# Patient Record
Sex: Male | Born: 1965 | Race: White | Hispanic: No | Marital: Married | Smoking: Never smoker
Health system: Southern US, Community
[De-identification: ages and names within clinical notes are randomized; demographics above are authoritative.]

## PROBLEM LIST (undated history)

## (undated) DIAGNOSIS — R413 Other amnesia: Secondary | ICD-10-CM

## (undated) DIAGNOSIS — K839 Disease of biliary tract, unspecified: Secondary | ICD-10-CM

## (undated) DIAGNOSIS — K9041 Non-celiac gluten sensitivity: Secondary | ICD-10-CM

## (undated) DIAGNOSIS — K859 Acute pancreatitis without necrosis or infection, unspecified: Secondary | ICD-10-CM

## (undated) DIAGNOSIS — F419 Anxiety disorder, unspecified: Secondary | ICD-10-CM

## (undated) DIAGNOSIS — R51 Headache: Secondary | ICD-10-CM

## (undated) DIAGNOSIS — M1990 Unspecified osteoarthritis, unspecified site: Secondary | ICD-10-CM

## (undated) DIAGNOSIS — K449 Diaphragmatic hernia without obstruction or gangrene: Secondary | ICD-10-CM

## (undated) DIAGNOSIS — I499 Cardiac arrhythmia, unspecified: Secondary | ICD-10-CM

## (undated) DIAGNOSIS — K900 Celiac disease: Secondary | ICD-10-CM

## (undated) DIAGNOSIS — I10 Essential (primary) hypertension: Secondary | ICD-10-CM

## (undated) DIAGNOSIS — K219 Gastro-esophageal reflux disease without esophagitis: Secondary | ICD-10-CM

## (undated) DIAGNOSIS — J45909 Unspecified asthma, uncomplicated: Secondary | ICD-10-CM

## (undated) DIAGNOSIS — E785 Hyperlipidemia, unspecified: Secondary | ICD-10-CM

## (undated) DIAGNOSIS — R519 Headache, unspecified: Secondary | ICD-10-CM

## (undated) DIAGNOSIS — H9190 Unspecified hearing loss, unspecified ear: Secondary | ICD-10-CM

## (undated) DIAGNOSIS — H539 Unspecified visual disturbance: Secondary | ICD-10-CM

## (undated) DIAGNOSIS — J189 Pneumonia, unspecified organism: Secondary | ICD-10-CM

## (undated) HISTORY — DX: Diaphragmatic hernia without obstruction or gangrene: K44.9

## (undated) HISTORY — DX: Disease of biliary tract, unspecified: K83.9

## (undated) HISTORY — DX: Other amnesia: R41.3

## (undated) HISTORY — DX: Hyperlipidemia, unspecified: E78.5

## (undated) HISTORY — PX: COLONOSCOPY: SHX174

## (undated) HISTORY — DX: Headache, unspecified: R51.9

## (undated) HISTORY — DX: Unspecified hearing loss, unspecified ear: H91.90

## (undated) HISTORY — DX: Celiac disease: K90.0

## (undated) HISTORY — DX: Non-celiac gluten sensitivity: K90.41

## (undated) HISTORY — DX: Unspecified visual disturbance: H53.9

## (undated) HISTORY — DX: Acute pancreatitis without necrosis or infection, unspecified: K85.90

## (undated) HISTORY — DX: Gastro-esophageal reflux disease without esophagitis: K21.9

## (undated) HISTORY — PX: WISDOM TOOTH EXTRACTION: SHX21

## (undated) HISTORY — DX: Headache: R51

## (undated) HISTORY — DX: Cardiac arrhythmia, unspecified: I49.9

## (undated) HISTORY — PX: UPPER GASTROINTESTINAL ENDOSCOPY: SHX188

## (undated) HISTORY — DX: Essential (primary) hypertension: I10

---

## 2001-05-16 ENCOUNTER — Encounter: Admission: RE | Admit: 2001-05-16 | Discharge: 2001-05-16 | Payer: Self-pay | Admitting: Unknown Physician Specialty

## 2002-03-10 ENCOUNTER — Encounter: Admission: RE | Admit: 2002-03-10 | Discharge: 2002-03-10 | Payer: Self-pay

## 2003-11-13 ENCOUNTER — Encounter: Admission: RE | Admit: 2003-11-13 | Discharge: 2003-11-13 | Payer: Self-pay

## 2007-12-19 ENCOUNTER — Encounter: Admission: RE | Admit: 2007-12-19 | Discharge: 2007-12-19 | Payer: Self-pay | Admitting: Internal Medicine

## 2008-07-15 ENCOUNTER — Encounter: Admission: RE | Admit: 2008-07-15 | Discharge: 2008-07-15 | Payer: Self-pay | Admitting: Internal Medicine

## 2008-11-17 ENCOUNTER — Encounter: Admission: RE | Admit: 2008-11-17 | Discharge: 2008-11-17 | Payer: Self-pay | Admitting: Internal Medicine

## 2010-04-08 ENCOUNTER — Encounter: Admission: RE | Admit: 2010-04-08 | Discharge: 2010-04-08 | Payer: Self-pay | Admitting: Internal Medicine

## 2014-02-03 ENCOUNTER — Ambulatory Visit: Payer: Self-pay | Admitting: Internal Medicine

## 2014-02-05 ENCOUNTER — Ambulatory Visit: Payer: Self-pay | Admitting: Internal Medicine

## 2014-02-05 ENCOUNTER — Encounter: Payer: Self-pay | Admitting: Internal Medicine

## 2014-02-05 ENCOUNTER — Ambulatory Visit (INDEPENDENT_AMBULATORY_CARE_PROVIDER_SITE_OTHER): Payer: BC Managed Care – PPO | Admitting: Internal Medicine

## 2014-02-05 VITALS — BP 114/88 | HR 92 | Ht 66.0 in | Wt 196.8 lb

## 2014-02-05 DIAGNOSIS — R9431 Abnormal electrocardiogram [ECG] [EKG]: Secondary | ICD-10-CM

## 2014-02-05 DIAGNOSIS — R079 Chest pain, unspecified: Secondary | ICD-10-CM

## 2014-02-05 DIAGNOSIS — E785 Hyperlipidemia, unspecified: Secondary | ICD-10-CM

## 2014-02-05 DIAGNOSIS — I10 Essential (primary) hypertension: Secondary | ICD-10-CM

## 2014-02-05 DIAGNOSIS — R072 Precordial pain: Secondary | ICD-10-CM

## 2014-02-05 NOTE — Patient Instructions (Signed)
Your physician has requested that you have en exercise stress myoview. For further information please visit [email or web address]. Please follow instruction sheet, as given.  Your physician recommends that you schedule a follow-up appointment after your stress test.

## 2014-02-09 ENCOUNTER — Encounter: Payer: Self-pay | Admitting: Internal Medicine

## 2014-02-09 DIAGNOSIS — I10 Essential (primary) hypertension: Secondary | ICD-10-CM | POA: Insufficient documentation

## 2014-02-09 DIAGNOSIS — R079 Chest pain, unspecified: Secondary | ICD-10-CM | POA: Insufficient documentation

## 2014-02-10 ENCOUNTER — Encounter: Payer: Self-pay | Admitting: Internal Medicine

## 2014-02-10 NOTE — Progress Notes (Signed)
OFFICE NOTE  Chief Complaint:  Chest pain  Primary Care Physician: Andreas Blower, MD  HPI:  Gary Weaver is a pleasant 48 year old male who is referred to me for evaluation of chest pain. He reports that over the past year he's had some shortness of breath with this is fairly stable. Recently he's had some discomfort in his chest which is radiated to his left arm. He does have a history of hypertension. There is a history of hypertension in his family. He's never had a cardiac stress test before. His primary care provider's office his EKG showed nonspecific ST and T wave changes concerning for ischemia. He also has a history of dyslipidemia his symptoms as mentioned are worse with exertion somewhat relieved by rest. He also has a history of GERD and is on medication for that. He is difficult to discern whether his symptoms may be related to reflux or perhaps are of cardiac etiology.  PMHx:  Past Medical History  Diagnosis Date  . GERD (gastroesophageal reflux disease)   . Gluten intolerance   . Hyperlipidemia   . Hypertension     History reviewed. No pertinent past surgical history.  FAMHx:  Family History  Problem Relation Age of Onset  . Hypertension Father     SOCHx:   reports that he has never smoked. He does not have any smokeless tobacco history on file. His alcohol and drug histories are not on file.  ALLERGIES:  Allergies  Allergen Reactions  . Gluten Meal Nausea And Vomiting    ROS: A comprehensive review of systems was negative except for: Respiratory: positive for dyspnea on exertion Cardiovascular: positive for exertional chest pressure/discomfort Gastrointestinal: positive for reflux symptoms  HOME MEDS: Current Outpatient Prescriptions  Medication Sig Dispense Refill  . fexofenadine (ALLEGRA) 30 MG tablet Take 30 mg by mouth daily.      . fluticasone (FLONASE) 50 MCG/ACT nasal spray Place 1 spray into both nostrils as needed.      Marland Kitchen lisinopril  (PRINIVIL,ZESTRIL) 20 MG tablet Take 20 mg by mouth daily.      Marland Kitchen omeprazole (PRILOSEC) 40 MG capsule Take 40 mg by mouth daily.       No current facility-administered medications for this visit.    LABS/IMAGING: No results found for this or any previous visit (from the past 48 hour(s)). No results found.  VITALS: BP 114/88  Pulse 92  Ht 5' 6"  (1.676 m)  Wt 196 lb 12.8 oz (89.268 kg)  BMI 31.78 kg/m2  EXAM: General appearance: alert and no distress Neck: no carotid bruit and no JVD Lungs: clear to auscultation bilaterally Heart: regular rate and rhythm, S1, S2 normal, no murmur, click, rub or gallop Abdomen: soft, non-tender; bowel sounds normal; no masses,  no organomegaly Extremities: extremities normal, atraumatic, no cyanosis or edema Pulses: 2+ and symmetric Skin: Skin color, texture, turgor normal. No rashes or lesions Neurologic: Grossly normal Psych: Mood, affect normal  EKG: Sinus rhythm with inverted T waves in 2, 3 and aVF concerning for possible ischemia  ASSESSMENT: 1. Chest/arm pain 2. Dyspnea on exertion 3. Abnormal EKG 4. Hypertension 5. Dyslipidemia 6. GERD  PLAN: 1.   Mr. Amick has a number of risk factors and a mildly abnormal EKG. He is describing some chest discomfort with left arm pain. I would recommend an exercise nuclear stress test to further evaluate. He does have a history of reflux as well which could be playing a role in his chest discomfort. Although his EKG  changes should be further evaluated. Plan to see him back after his stress test to review the results with him.  Pixie Casino, MD, [facility] Attending Cardiologist [facility]  Tameem Pullara C 02/10/2014, 5:28 PM

## 2014-02-12 ENCOUNTER — Telehealth (HOSPITAL_COMMUNITY): Payer: Self-pay

## 2014-02-12 NOTE — Telephone Encounter (Signed)
Pt given detailed instructions for NUC MPI on Tuesday. Pt did RBV same.  

## 2014-02-17 ENCOUNTER — Ambulatory Visit (HOSPITAL_COMMUNITY)
Admission: RE | Admit: 2014-02-17 | Discharge: 2014-02-17 | Disposition: A | Discharge status: Home / Self Care | Payer: BC Managed Care – PPO | Source: Ambulatory Visit | Attending: Internal Medicine | Admitting: Internal Medicine

## 2014-02-17 DIAGNOSIS — R0989 Other specified symptoms and signs involving the circulatory and respiratory systems: Secondary | ICD-10-CM | POA: Insufficient documentation

## 2014-02-17 DIAGNOSIS — R079 Chest pain, unspecified: Secondary | ICD-10-CM

## 2014-02-17 DIAGNOSIS — E785 Hyperlipidemia, unspecified: Secondary | ICD-10-CM

## 2014-02-17 DIAGNOSIS — J45909 Unspecified asthma, uncomplicated: Secondary | ICD-10-CM | POA: Insufficient documentation

## 2014-02-17 DIAGNOSIS — R0602 Shortness of breath: Secondary | ICD-10-CM | POA: Insufficient documentation

## 2014-02-17 DIAGNOSIS — R0609 Other forms of dyspnea: Secondary | ICD-10-CM | POA: Insufficient documentation

## 2014-02-17 DIAGNOSIS — R9431 Abnormal electrocardiogram [ECG] [EKG]: Secondary | ICD-10-CM | POA: Insufficient documentation

## 2014-02-17 DIAGNOSIS — I10 Essential (primary) hypertension: Secondary | ICD-10-CM

## 2014-02-17 DIAGNOSIS — R002 Palpitations: Secondary | ICD-10-CM | POA: Insufficient documentation

## 2014-02-17 DIAGNOSIS — R42 Dizziness and giddiness: Secondary | ICD-10-CM | POA: Insufficient documentation

## 2014-02-17 MED ORDER — TECHNETIUM TC 99M SESTAMIBI GENERIC - CARDIOLITE
10.4000 | Freq: Once | INTRAVENOUS | Status: AC | PRN
  Administered 2014-02-17: 10 via INTRAVENOUS

## 2014-02-17 MED ORDER — TECHNETIUM TC 99M SESTAMIBI GENERIC - CARDIOLITE
31.4000 | Freq: Once | INTRAVENOUS | Status: AC | PRN
  Administered 2014-02-17: 31 via INTRAVENOUS

## 2014-02-17 NOTE — Procedures (Addendum)
[facility] [address] [place] [address] [phone]  Cardiology Nuclear Med Study  Gary Weaver is a 48 y.o. male     MRN : [id number]     DOB: 02/05/1966  Procedure Date: 02/17/2014  Nuclear Med Background Indication for Stress Test:  Evaluation for Ischemia, [facility] and Abnormal EKG History:  Asthma and No prior cardiac history reported;No prior NUC MPI for comparison. Cardiac Risk Factors: Hypertension, Lipids and Overweight  Symptoms:  Chest Pain, DOE, Fatigue, Light-Headedness, Palpitations and SOB   Nuclear Pre-Procedure Caffeine/Decaff Intake:  1:00am NPO After: 11am   IV Site: R Forearm  IV 0.9% NS with Angio Cath:  22g  Chest Size (in):  46"  IV Started by: Rolene Course, RN  Height: 5' 6"  (1.676 m)  Cup Size: n/a  BMI:  Body mass index is 31.65 kg/(m^2). Weight:  196 lb (88.905 kg)   Tech Comments:  n/a    Nuclear Med Study 1 or 2 day study: 1 day  Stress Test Type:  Stress  Order Authorizing Provider:  Lyman Bishop, MD   Resting Radionuclide: Technetium 71mSestamibi  Resting Radionuclide Dose: 10.4 mCi   Stress Radionuclide:  Technetium 929mestamibi  Stress Radionuclide Dose: 31.4 mCi           Stress Protocol Rest HR: 80 Stress HR:169  Rest BP: 118/89 Stress BP:152/78  Exercise Time (min): 10:16 METS: 12.10   Predicted Max HR: 173 bpm % Max HR: 97.69 bpm Rate Pressure Product: 2521115Dose of Adenosine (mg):  n/a Dose of Lexiscan: n/a mg  Dose of Atropine (mg): n/a Dose of Dobutamine: n/a mcg/kg/min (at max HR)  Stress Test Technologist: GwMellody MemosCCT Nuclear Technologist: PaImagene RichesCNMT   Rest Procedure:  Myocardial perfusion imaging was performed at rest 45 minutes following the intravenous administration of Technetium 9925mstamibi. Stress Procedure:  The patient performed treadmill exercise using a Bruce  Protocol for 10 minutes 16 seconds. The patient stopped due to  fatigue and shortness of breath. Patinet denied any chest pain.  There were no significant ST-T wave changes.  Technetium 69m36mtamibi was injected IV at peak exercise and myocardial perfusion imaging was performed after a brief delay.  Transient Ischemic Dilatation (Normal <1.22):  0.90  QGS EDV:  82 ml QGS ESV:  36 ml LV Ejection Fraction: 56%  Rest ECG: NSR - Normal EKG  Stress ECG: No significant change from baseline ECG  QPS Raw Data Images:  There is interference from nuclear activity from structures below the diaphragm. This does not affect the ability to read the study. Stress Images:  Fixed apical lateral defect Rest Images:  Fixed apical lateral defect Subtraction (SDS):  No evidence of ischemia.  Impression Exercise Capacity:  Excellent exercise capacity. BP Response:  Normal blood pressure response. Clinical Symptoms:  No significant symptoms noted. ECG Impression:  No significant ST segment change suggestive of ischemia. Comparison with Prior Nuclear Study: No previous nuclear study performed  Overall Impression:  Low risk stress nuclear study with mild apical lateral artifact. No reversible ischemia.  LV Wall Motion:  NL LV Function; NL Wall Motion; EF 56%  KennPixie Casino, FACCEarlingtontified in Nuclear Cardiology Attending Cardiologist [facility]  02/17/2014 4:32 PM

## 2014-03-04 ENCOUNTER — Ambulatory Visit (INDEPENDENT_AMBULATORY_CARE_PROVIDER_SITE_OTHER): Payer: BC Managed Care – PPO | Admitting: Internal Medicine

## 2014-03-04 ENCOUNTER — Encounter (HOSPITAL_COMMUNITY): Payer: Self-pay

## 2014-03-04 ENCOUNTER — Encounter: Payer: Self-pay | Admitting: Internal Medicine

## 2014-03-04 VITALS — BP 122/74 | HR 84 | Ht 67.0 in | Wt 196.2 lb

## 2014-03-04 DIAGNOSIS — I10 Essential (primary) hypertension: Secondary | ICD-10-CM

## 2014-03-04 DIAGNOSIS — R002 Palpitations: Secondary | ICD-10-CM | POA: Insufficient documentation

## 2014-03-04 DIAGNOSIS — R0789 Other chest pain: Secondary | ICD-10-CM

## 2014-03-04 NOTE — Patient Instructions (Signed)
Your physician recommends that you schedule a follow-up appointment as needed  

## 2014-03-04 NOTE — Progress Notes (Signed)
    OFFICE NOTE  Chief Complaint:  Chest pain  Primary Care Physician: Andreas Blower, MD  HPI:  Gary Weaver is a pleasant 48 year old male who is referred to me for evaluation of chest pain. He reports that over the past year he's had some shortness of breath with this is fairly stable. Recently he's had some discomfort in his chest which is radiated to his left arm. He does have a history of hypertension. There is a history of hypertension in his family. He's never had a cardiac stress test before. His primary care provider's office his EKG showed nonspecific ST and T wave changes concerning for ischemia. He also has a history of dyslipidemia his symptoms as mentioned are worse with exertion somewhat relieved by rest. He also has a history of GERD and is on medication for that. He is difficult to discern whether his symptoms may be related to reflux or perhaps are of cardiac etiology.   Gary Weaver returns today for followup of his stress test.  This was negative for inducible ischemia with an EF of 56%. He denies any worsening shortness of breath or chest discomfort. His palpitations have resolved. He still has problems with reflux which is that with her many years. He is currently working with his primary care provider on that.  PMHx:  Past Medical History  Diagnosis Date  . GERD (gastroesophageal reflux disease)   . Gluten intolerance   . Hyperlipidemia   . Hypertension     History reviewed. No pertinent past surgical history.  FAMHx:  Family History  Problem Relation Age of Onset  . Hypertension Father     SOCHx:   reports that he has never smoked. He does not have any smokeless tobacco history on file. His alcohol and drug histories are not on file.  ALLERGIES:  Allergies  Allergen Reactions  . Gluten Meal Nausea And Vomiting    ROS: A comprehensive review of systems was negative.  HOME MEDS: Current Outpatient Prescriptions  Medication Sig Dispense Refill    . fexofenadine (ALLEGRA) 30 MG tablet Take 30 mg by mouth daily.      . fluticasone (FLONASE) 50 MCG/ACT nasal spray Place 1 spray into both nostrils as needed.      Marland Kitchen lisinopril (PRINIVIL,ZESTRIL) 20 MG tablet Take 20 mg by mouth daily.      Marland Kitchen omeprazole (PRILOSEC) 40 MG capsule Take 40 mg by mouth daily.       No current facility-administered medications for this visit.    LABS/IMAGING: No results found for this or any previous visit (from the past 48 hour(s)). No results found.  VITALS: BP 122/74  Pulse 84  Ht 5' 7"  (1.702 m)  Wt 196 lb 3.2 oz (88.996 kg)  BMI 30.72 kg/m2  EXAM: deferred  EKG: deferred  ASSESSMENT: 1. Chest/arm pain resolved - negative NST 2. Hypertension 3. Dyslipidemia 4. GERD 5. Palpitations  PLAN: 1.   Gary Weaver had a negative nuclear stress test which is reassuring. Shortness of breath and chest discomfort has improved. He occasionally has palpitations however they're not particular bothersome. I plan to see him back as needed and he has more palpitations we could consider monitoring.  Pixie Casino, MD, [facility] Attending Cardiologist [facility]  Gary Weaver,Gary Weaver 03/04/2014, 2:09 PM

## 2014-08-28 ENCOUNTER — Ambulatory Visit: Payer: Self-pay | Admitting: Neurology

## 2014-10-22 ENCOUNTER — Encounter: Payer: Self-pay | Admitting: Neurology

## 2014-10-22 ENCOUNTER — Ambulatory Visit (INDEPENDENT_AMBULATORY_CARE_PROVIDER_SITE_OTHER): Payer: BLUE CROSS/BLUE SHIELD | Admitting: Neurology

## 2014-10-22 VITALS — BP 134/82 | HR 68 | Resp 14 | Ht 67.0 in | Wt 197.4 lb

## 2014-10-22 DIAGNOSIS — M5416 Radiculopathy, lumbar region: Secondary | ICD-10-CM | POA: Diagnosis not present

## 2014-10-22 DIAGNOSIS — G4700 Insomnia, unspecified: Secondary | ICD-10-CM | POA: Insufficient documentation

## 2014-10-22 DIAGNOSIS — R200 Anesthesia of skin: Secondary | ICD-10-CM | POA: Insufficient documentation

## 2014-10-22 DIAGNOSIS — R208 Other disturbances of skin sensation: Secondary | ICD-10-CM | POA: Insufficient documentation

## 2014-10-22 DIAGNOSIS — M791 Myalgia, unspecified site: Secondary | ICD-10-CM | POA: Insufficient documentation

## 2014-10-22 DIAGNOSIS — M2550 Pain in unspecified joint: Secondary | ICD-10-CM | POA: Insufficient documentation

## 2014-10-22 DIAGNOSIS — IMO0001 Reserved for inherently not codable concepts without codable children: Secondary | ICD-10-CM

## 2014-10-22 DIAGNOSIS — M609 Myositis, unspecified: Secondary | ICD-10-CM

## 2014-10-22 DIAGNOSIS — M542 Cervicalgia: Secondary | ICD-10-CM | POA: Insufficient documentation

## 2014-10-22 DIAGNOSIS — M545 Low back pain, unspecified: Secondary | ICD-10-CM | POA: Insufficient documentation

## 2014-10-22 MED ORDER — GABAPENTIN 300 MG PO CAPS: ORAL_CAPSULE | ORAL | Status: DC

## 2014-10-22 MED ORDER — MELOXICAM 15 MG PO TABS: 15.0000 mg | ORAL_TABLET | Freq: Every day | ORAL | Status: DC

## 2014-10-22 NOTE — Progress Notes (Signed)
[facility]  PATIENT: Gary Weaver DOB: July 30, 1966  REFERRING DOCTOR OR PCP:  Jani Gravel SOURCE: Patient  _________________________________   HISTORICAL  CHIEF COMPLAINT:  Chief Complaint  Patient presents with  . Back Pain    [name] c/o lower back pain radiating down both legs--numbness both legs also.  Onset yrs. ago, worse in the last yr.  No known specific injury.    . Neck Pain    Sts. has had neck pain radiating down both arms for approx. 10 yrs.  Left sided facial numbness for about 8 mos./fim  . Numbness  . Memory Loss    Sts. more trouble finding the right words in conversation over the last 3 yrs, worse in the last yr.  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  Denies any recent imaging studies of brain, neck, back/fim    HISTORY OF PRESENT ILLNESS:  I had the pleasure of seeing your patient, Gary Weaver, for a neurologic consultation regarding his pain and numbness and other symptoms.  He reports pain in the back that he has had for many years. This has worsened over the past few years. Pain has not responded well to occasional use of anti-inflammatories. Over the past few years, also he has had numbness into his legs. There is numbness in the groin as well. At times it'll be an uncomfortable sensation at could be in either leg. He notes a lot of muscle cramping in the back and neck area. He denies any major change in his gait. He notes no major problems with his bladder. He may have mild frequency some days but he notes that he drinks a lot of coffee. The groin numbness has been over the last year. He denies bowel or bladder changes. He notes numbness in his penis as well as the adjacent part of the groin. He has difficulty maintaining an erection.  He reports a fairly long history of neck pain, at least 10 years. 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 He had had some accidents. Mostly the pain  would be in the neck and radiating down the left arm. However, more recently he has had some symptoms with numbness in the left side of the face as well as a dysesthetic sensation in the top of the head. At times, his neck will lock up and range of motion will be reduced. When that happens, he usually lays down. He often feels better later. Pain has not responded well to NSAIDs. Noted that along with the unusual sensation in his head he will note some difficulty with cognitive tasks. He gets frequent tingling in the left arm, often associated with numbness. The left side is always worse than the right. There is no change based on position of his neck.  He notes poor sleep.  He has both difficulty falling asleep and staying asleep.     He feels that he has had more difficulty with memory and other cognitive tasks over the last year or so. Specifically, focus is poor. He was processing speed is reduced and he has some short-term memory issues.  He denies having any recent imaging studies with plain films or MRIs.  He states he has had one test showing Lyme disease and another test showed he did not.   He has a h/o a bullseye rash and was treated for Lyme 10 years ago.    REVIEW OF SYSTEMS: Constitutional: No  fevers, chills, sweats, or change in appetite.  Notes fatigue.  Poor sleep, insomnia Eyes: Notes some blurred vision.  No double vision, eye pain Ear, nose and throat: No hearing loss, ear pain, nasal congestion, sore throat Cardiovascular: No chest pain, palpitations Respiratory: Occ shortness of breath.   No wheezes GastrointestinaI: No nausea, vomiting, diarrhea, abdominal pain, fecal incontinence.  Repots gluten intolerance Genitourinary: No dysuria, urinary retention or frequency.  No nocturia. Musculoskeletal:Pain and stiffness in many joints and neck, backn Integumentary: No rash, pruritus, skin lesions Neurological: as above Psychiatric: No depression but notes anxiety.   Some memory  loss Endocrine: No palpitations, diaphoresis, change in appetite, change in weigh or increased thirst Hematologic/Lymphatic: No anemia, purpura, petechiae. Allergic/Immunologic: No itchy/runny eyes, nasal congestion, recent allergic reactions, rashes  ALLERGIES: Allergies  Allergen Reactions  . Gluten Meal Nausea And Vomiting    HOME MEDICATIONS:  Current outpatient prescriptions:  .  fexofenadine (ALLEGRA) 30 MG tablet, Take 30 mg by mouth daily., Disp: , Rfl:  .  fluticasone (FLONASE) 50 MCG/ACT nasal spray, Place 1 spray into both nostrils as needed., Disp: , Rfl:  .  lisinopril (PRINIVIL,ZESTRIL) 20 MG tablet, Take 20 mg by mouth daily., Disp: , Rfl:  .  omeprazole (PRILOSEC) 40 MG capsule, Take 40 mg by mouth daily., Disp: , Rfl:  .  NEXIUM 40 MG capsule, , Disp: , Rfl: 5  PAST MEDICAL HISTORY: Past Medical History  Diagnosis Date  . GERD (gastroesophageal reflux disease)   . Gluten intolerance   . Hyperlipidemia   . Hypertension   . Headache   . Hearing loss   . Memory loss   . Vision abnormalities     PAST SURGICAL HISTORY: History reviewed. No pertinent past surgical history.  FAMILY HISTORY: Family History  Problem Relation Age of Onset  . Hypertension Father   . Prostate cancer Father   . Bladder Cancer Mother     SOCIAL HISTORY:  History   Social History  . Marital Status: Married    Spouse Name: N/A  . Number of Children: N/A  . Years of Education: N/A   Occupational History  . Not on file.   Social History Main Topics  . Smoking status: Never Smoker   . Smokeless tobacco: Not on file  . Alcohol Use: Not on file  . Drug Use: Not on file  . Sexual Activity: Not on file   Other Topics Concern  . Not on file   Social History Narrative     PHYSICAL EXAM  Filed Vitals:   10/22/14 0901  BP: 134/82  Pulse: 68  Resp: 14  Height: 5' 7"  (1.702 m)  Weight: 197 lb 6.4 oz (89.54 kg)    Body mass index is 30.91 kg/(m^2).   General:  The patient is well-developed and well-nourished and in no acute distress  Eyes:  Funduscopic exam shows normal optic discs and retinal vessels.  Neck: The neck is supple, no carotid bruits are noted.  The neck is mildly tender at occipiut, minimal in paraspinal region.     Cardiovascular: The heart has a regular rate and rhythm with a normal S1 and S2. There were no murmurs, gallops or rubs. Lungs are clear to auscultation.  Skin: Extremities are without significant edema.  Musculoskeletal:  Back is mildly tender.  Only mild tenderness at classic FMS tender points of upper back.     Neurologic Exam  Mental status: The patient is alert and oriented x 3 at the time of  the examination. The patient has apparent normal recent and remote memory, with an apparently normal attention span and concentration ability.   Speech is normal.  Cranial nerves: Extraocular movements are full. Pupils are equal, round, and reactive to light and accomodation.  Visual fields are full.  Facial symmetry is present. There is mildly altered temperaturel sensation in left V2 more than V1 distribution.   Facial strength is normal.  Trapezius and sternocleidomastoid strength is normal. No dysarthria is noted.  The tongue is midline, and the patient has symmetric elevation of the soft palate. No obvious hearing deficits are noted.  Motor:  Muscle bulk is normal.   Tone is normal. Strength is  5 / 5 in all 4 extremities.   Sensory: Sensory testing is intact to temperature, soft touch and vibration sensation in all 4 extremities.  Coordination: Cerebellar testing reveals good finger-nose-finger and heel-to-shin bilaterally.  Gait and station: Station is normal.   Gait and tandem walk are miildly wide stanced. Tandem gait is normal. Romberg is negative.   Reflexes: Deep tendon reflexes are symmetric and normal bilaterally.     DIAGNOSTIC DATA (LABS, IMAGING, TESTING) - I reviewed patient records, labs, notes, testing  and imaging myself where available    ASSESSMENT AND PLAN  Neck pain - Plan: Sedimentation Rate, ANA, Rheumatoid Factor, MR Cervical Spine Wo Contrast, Lyme Ab/Western Blot Reflex  Radiculopathy, lumbar region  Facial numbness - Plan: MR Brain W Wo Contrast  Dysesthesia - Plan: MR Brain W Wo Contrast, MR Cervical Spine Wo Contrast, Lyme Ab/Western Blot Reflex  Multiple joint pain - Plan: Sedimentation Rate, ANA, Rheumatoid Factor, Lyme Ab/Western Blot Reflex  Myalgia and myositis - Plan: Sedimentation Rate, ANA, Rheumatoid Factor, Lyme Ab/Western Blot Reflex  Insomnia   In summary, Harry Shuck is a 49 year old man with neck pain associated with left arm numbness, lower back pain with bilateral leg numbness also reports multiple joint pain and myalgias.    Additionally, there is facial numbness on the left. He notes some difficulty with insomnia and with cognitive dysfunction. The etiology of his symptoms is not readily apparent based on his examination. A CNS process such as multiple sclerosis, neuro Lyme, vasculitis is possible as unifying diagnoses and we need to check some serologic studies as well as MRIs of the brain and the cervical spine. We will check ESR, ANA, RF, Lyme.  To help with his insomnia and a dysesthetic pain, I also started gabapentin and will titrate to 300 in the morning, 300 mg in the evening and 600 mg at night. Hopefully sleeping better will improve some of his symptoms as well. He is advised to stay active and exercise as tolerated.  We will contact him with the results of the studies.  He will return for office visit in 6 weeks or call sooner if he has new or worsening neurologic symptoms. Judge Duque A. Felecia Shelling, MD, PhD 09/13/4386, 8:75 AM Certified in Neurology, Clinical Neurophysiology, Sleep Medicine, Pain Medicine and Neuroimaging  [facility] [address] [place], [place] [address] [phone]

## 2014-10-23 ENCOUNTER — Telehealth: Payer: Self-pay | Admitting: *Deleted

## 2014-10-23 LAB — LYME AB/WESTERN BLOT REFLEX

## 2014-10-23 LAB — SEDIMENTATION RATE: Sed Rate: 2 mm/hr (ref 0–15)

## 2014-10-23 LAB — ANA: Anti Nuclear Antibody(ANA): NEGATIVE

## 2014-10-23 LAB — RHEUMATOID FACTOR: RHEUMATOID FACTOR: 7.7 [IU]/mL (ref 0.0–13.9)

## 2014-10-23 MED ORDER — ALPRAZOLAM 0.5 MG PO TABS
0.5000 mg | ORAL_TABLET | Freq: Once | ORAL | Status: DC
Start: 2014-10-23 — End: 2016-04-12

## 2014-10-23 NOTE — Telephone Encounter (Signed)
-----   Message from Lavera GuiseJodi S Isom sent at 10/22/2014  7:46 PM EST ----- Regarding: mri Pt requests xanex to be called in at [facility] for his upcoming MRI's on 11/04/14

## 2014-10-23 NOTE — Telephone Encounter (Signed)
Per RAS, rx. for Xanax 0.5mg  #2, take one 30 min prior to mri and take 2nd tab to appt with him to repeat prn printed, signed, in Cassandra's office to go up front for pick up.  LMOM that Xanax rx. will be ready to pick up in the office on Monday--but that it must be picked up in office./fim

## 2014-10-26 ENCOUNTER — Telehealth: Payer: Self-pay | Admitting: *Deleted

## 2014-10-26 NOTE — Telephone Encounter (Signed)
Spoke with Gary Weaver and per RAS, advised him of normal labwork, including Lyme test.  He verbalized understanding of same/fim

## 2014-10-26 NOTE — Telephone Encounter (Signed)
-----   Message from Asa Lenteichard A Sater, MD sent at 10/26/2014 12:27 PM EDT ----- Lab tests, including Lyme are normal

## 2014-11-04 ENCOUNTER — Ambulatory Visit (INDEPENDENT_AMBULATORY_CARE_PROVIDER_SITE_OTHER): Payer: BLUE CROSS/BLUE SHIELD

## 2014-11-04 DIAGNOSIS — R208 Other disturbances of skin sensation: Secondary | ICD-10-CM | POA: Diagnosis not present

## 2014-11-04 DIAGNOSIS — R200 Anesthesia of skin: Secondary | ICD-10-CM

## 2014-11-04 DIAGNOSIS — M542 Cervicalgia: Secondary | ICD-10-CM

## 2014-11-05 MED ORDER — GADOPENTETATE DIMEGLUMINE 469.01 MG/ML IV SOLN: 19.0000 mL | Freq: Once | INTRAVENOUS | Status: AC | PRN

## 2014-11-10 ENCOUNTER — Telehealth: Payer: Self-pay | Admitting: *Deleted

## 2014-11-10 MED ORDER — METHYLPREDNISOLONE 4 MG PO KIT: PACK | ORAL | Status: DC

## 2014-11-10 NOTE — Telephone Encounter (Signed)
Spoke with Loraine LericheMark, and per RAS, advised him of normal mri brain, and arthritic changes, bulging disc noted on mri c-spine.  He verbalized understanding of same, is agreeable to trying Medrol dose pk, so I have escribed this to [facility] for him.  Will address PT if Medrol dose pk doesn't help/fim

## 2014-11-10 NOTE — Telephone Encounter (Signed)
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   If not better, medrol dose pack / PT if he wants  MRI brain is normal

## 2014-11-24 ENCOUNTER — Encounter: Payer: Self-pay | Admitting: Neurology

## 2014-11-24 ENCOUNTER — Ambulatory Visit (INDEPENDENT_AMBULATORY_CARE_PROVIDER_SITE_OTHER): Payer: BLUE CROSS/BLUE SHIELD | Admitting: Neurology

## 2014-11-24 VITALS — BP 134/82 | HR 86 | Resp 14 | Ht 67.0 in | Wt 202.6 lb

## 2014-11-24 DIAGNOSIS — M791 Myalgia: Secondary | ICD-10-CM | POA: Diagnosis not present

## 2014-11-24 DIAGNOSIS — M545 Low back pain, unspecified: Secondary | ICD-10-CM

## 2014-11-24 DIAGNOSIS — M609 Myositis, unspecified: Secondary | ICD-10-CM

## 2014-11-24 DIAGNOSIS — G4700 Insomnia, unspecified: Secondary | ICD-10-CM | POA: Diagnosis not present

## 2014-11-24 DIAGNOSIS — M542 Cervicalgia: Secondary | ICD-10-CM | POA: Diagnosis not present

## 2014-11-24 DIAGNOSIS — R208 Other disturbances of skin sensation: Secondary | ICD-10-CM

## 2014-11-24 DIAGNOSIS — IMO0001 Reserved for inherently not codable concepts without codable children: Secondary | ICD-10-CM

## 2014-11-24 MED ORDER — CYCLOBENZAPRINE HCL 5 MG PO TABS: 5.0000 mg | ORAL_TABLET | Freq: Every day | ORAL | Status: DC

## 2014-11-24 NOTE — Patient Instructions (Signed)

## 2014-11-24 NOTE — Progress Notes (Signed)
[facility]  PATIENT: Gary Weaver DOB: 1966/03/18  REFERRING DOCTOR OR PCP:  Jani Gravel SOURCE: Patient  _________________________________   HISTORICAL  CHIEF COMPLAINT:  Chief Complaint  Patient presents with  . Neck Pain    Sts. neck pain is 75% or more better and lower back pain is 50% better since starting Gabapentin.  Sleep isn't much better--he is only able to tolerate Gabapentin 371m qhs--if he takes 606mhe feels too foggy the next day.  He is still having trouble going to and staying asleep.  No improvement in cognition./fim  . Back Pain  . Insomnia  . Memory Loss    HISTORY OF PRESENT ILLNESS:  Gary Heslinreturns for a for a neurologic follow-up visit regarding his pain and numbness and other symptoms.  Neck pain is doing much better after starting gabapentin - taking 300 mg po tid.    He occasionally gets some tingling in the arms but no longer feels pain there. No arm weakness.  Neck sometimes stiff.   LBP:   He notes some pain still in the lower back and has some numbness in his legs but it is not painful now.   Leg pain is much better.    LBP increases with pushing or lifting.     Sitting a long time increases his pain but stretching helps some.      He feels a little weak in the legs but no worse.      Myalgias/Joint pain:   Both of these are doing better since starting gabapentin ans sleeping better.  Still gets some muscle aches in large muscles at times  Insomnia:  He notes sleep is better, especially if he takes gabapentin 600 mg instead of 300 (but higher dose makes him a little sleepy the next day.    He still  has trouble falling asleep but is doing better with staying asleep.     Cognitive:  He feels that he has a little less difficulty with memory and other cognitive tasks the last month  Specifically, focus is poor (though better). Still notes some processing speed  Reduction and he has some short-term memory issues.  I  personally reviewed his imaging studies in Mr. Gary Weaver:   MRI of the brain was normal.   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   Labs were normal..  REVIEW OF SYSTEMS: Constitutional: No fevers, chills, sweats, or change in appetite.  Notes less fatigue.  Still has some insomnia Eyes: Notes some blurred vision.  No double vision, eye pain Ear, nose and throat: No hearing loss, ear pain, nasal congestion, sore throat Cardiovascular: No chest pain, palpitations Respiratory: Occ shortness of breath.   No wheezes GastrointestinaI: No nausea, vomiting, diarrhea, abdominal pain, fecal incontinence.  Reports gluten intolerance Genitourinary: No dysuria, urinary retention or frequency.  No nocturia. Musculoskeletal:Pain and stiffness in joints and muscles Integumentary: No rash, pruritus, skin lesions Neurological: as above Psychiatric: No depression but notes anxiety.   Some memory loss Endocrine: No palpitations, diaphoresis, change in appetite, change in weigh or increased thirst Hematologic/Lymphatic: No anemia, purpura, petechiae. Allergic/Immunologic: No itchy/runny eyes, nasal congestion, recent allergic reactions, rashes  ALLERGIES: Allergies  Allergen Reactions  . Gluten Meal Nausea And Vomiting    HOME MEDICATIONS:  Current outpatient prescriptions:  .  ALPRAZolam (XANAX) 0.5 MG tablet, Take 1 tablet (0.5 mg total) by mouth once. Take one tablet 30  min. prior to mri.  Take 2nd tablet with you and repeat if needed., Disp: 2 tablet, Rfl: 0 .  fexofenadine (ALLEGRA) 30 MG tablet, Take 30 mg by mouth daily., Disp: , Rfl:  .  fluticasone (FLONASE) 50 MCG/ACT nasal spray, Place 1 spray into both nostrils as needed., Disp: , Rfl:  .  gabapentin (NEURONTIN) 300 MG capsule, One in am, one in evening and two po at bedtime, Disp: 120 capsule, Rfl: 3 .  lisinopril (PRINIVIL,ZESTRIL) 20 MG tablet, Take 20  mg by mouth daily., Disp: , Rfl:  .  meloxicam (MOBIC) 15 MG tablet, Take 1 tablet (15 mg total) by mouth daily., Disp: 30 tablet, Rfl: 5 .  omeprazole (PRILOSEC) 40 MG capsule, Take 40 mg by mouth daily., Disp: , Rfl:  .  NEXIUM 40 MG capsule, , Disp: , Rfl: 5  PAST MEDICAL HISTORY: Past Medical History  Diagnosis Date  . GERD (gastroesophageal reflux disease)   . Gluten intolerance   . Hyperlipidemia   . Hypertension   . Headache   . Hearing loss   . Memory loss   . Vision abnormalities     PAST SURGICAL HISTORY: History reviewed. No pertinent past surgical history.  FAMILY HISTORY: Family History  Problem Relation Age of Onset  . Hypertension Father   . Prostate cancer Father   . Bladder Cancer Mother     SOCIAL HISTORY:  History   Social History  . Marital Status: Married    Spouse Name: N/A  . Number of Children: N/A  . Years of Education: N/A   Occupational History  . Not on file.   Social History Main Topics  . Smoking status: Never Smoker   . Smokeless tobacco: Not on file  . Alcohol Use: Not on file  . Drug Use: Not on file  . Sexual Activity: Not on file   Other Topics Concern  . Not on file   Social History Narrative     PHYSICAL EXAM  Filed Vitals:   11/24/14 0853  BP: 134/82  Pulse: 86  Resp: 14  Height: 5' 7"  (1.702 m)  Weight: 202 lb 9.6 oz (91.899 kg)    Body mass index is 31.72 kg/(m^2).   General: The patient is well-developed and well-nourished and in no acute distress  Neck: The neck is supple, no carotid bruits are noted.  The neck is nontender at occiiput and paraspinal region.     Musculoskeletal:  Back is mildly tender over piriformis and lower lumbar paraspinals. Less tenderness at classic FMS tender points of upper back.     Neurologic Exam  Mental status: The patient is alert and oriented x 3 at the time of the examination. The patient has apparent normal recent and remote memory, with an apparently normal  attention span and concentration ability.   Speech is normal.  Cranial nerves: Extraocular movements are full.   Facial symmetry is present.    Facial strength is normal.  Trapezius and sternocleidomastoid strength is normal. No dysarthria is noted.  The tongue is midline, and the patient has symmetric elevation of the soft palate. No obvious hearing deficits are noted.  Motor:  Muscle bulk is normal.   Tone is normal. Strength is  5 / 5 in all 4 extremities.   Sensory: Sensory testing is intact to touch  in all 4 extremities.  Coordination:   Good FTN/RAM in arms  Gait and station: Station is normal.   Gait and tandem walk are miildly wide  stanced. Tandem gait is normal.   Reflexes: Deep tendon reflexes are symmetric and normal bilaterally.     DIAGNOSTIC DATA (LABS, IMAGING, TESTING) - I reviewed patient records, labs, notes, testing and imaging myself where available    ASSESSMENT AND PLAN   Neck pain  Midline low back pain without sciatica  Dysesthesia  Insomnia  Myalgia and myositis   1.    Continue gabapentin at 300-300-300.   Can increase to 300-300-600 if he wants to increase night time dose.   Add cyclobenzaprine 5 mg at night to help sleep and spasms.   2.    I reviewed some LBP exercises and gave a handout    Try to break a few minutes every hour to stretch. 3.    RTC 4 months, sooner if problems   Richard A. Felecia Shelling, MD, PhD 1/83/6725, 5:00 AM Certified in Neurology, Clinical Neurophysiology, Sleep Medicine, Pain Medicine and Neuroimaging  [facility] [address], [place], [place] [address] [phone]

## 2015-03-26 ENCOUNTER — Ambulatory Visit: Payer: BLUE CROSS/BLUE SHIELD | Admitting: Neurology

## 2015-03-30 ENCOUNTER — Ambulatory Visit (INDEPENDENT_AMBULATORY_CARE_PROVIDER_SITE_OTHER): Payer: 59 | Admitting: Neurology

## 2015-03-30 ENCOUNTER — Encounter: Payer: Self-pay | Admitting: Neurology

## 2015-03-30 VITALS — BP 114/72 | HR 76 | Resp 14 | Ht 67.0 in | Wt 198.2 lb

## 2015-03-30 DIAGNOSIS — R41840 Attention and concentration deficit: Secondary | ICD-10-CM | POA: Diagnosis not present

## 2015-03-30 DIAGNOSIS — M609 Myositis, unspecified: Secondary | ICD-10-CM

## 2015-03-30 DIAGNOSIS — M791 Myalgia: Secondary | ICD-10-CM

## 2015-03-30 DIAGNOSIS — IMO0001 Reserved for inherently not codable concepts without codable children: Secondary | ICD-10-CM

## 2015-03-30 DIAGNOSIS — R208 Other disturbances of skin sensation: Secondary | ICD-10-CM

## 2015-03-30 DIAGNOSIS — G4700 Insomnia, unspecified: Secondary | ICD-10-CM | POA: Diagnosis not present

## 2015-03-30 DIAGNOSIS — M545 Low back pain, unspecified: Secondary | ICD-10-CM

## 2015-03-30 DIAGNOSIS — M542 Cervicalgia: Secondary | ICD-10-CM

## 2015-03-30 MED ORDER — CYCLOBENZAPRINE HCL 5 MG PO TABS: 5.0000 mg | ORAL_TABLET | Freq: Every day | ORAL | Status: DC

## 2015-03-30 MED ORDER — GABAPENTIN 300 MG PO CAPS: ORAL_CAPSULE | ORAL | Status: DC

## 2015-03-30 NOTE — Progress Notes (Signed)
[facility]  PATIENT: Gary Weaver DOB: Nov 21, 1965  REFERRING DOCTOR OR PCP:  Jani Gravel SOURCE: Patient  _________________________________   HISTORICAL  CHIEF COMPLAINT:  Chief Complaint  Patient presents with  . Neck Pain    Sts. neck and lbp are improved with Flexeril--neck pain is more improved than lbp is.  Sts. he is also sleeping better with Flexeril./fim  . Back Pain  . Insomnia    HISTORY OF PRESENT ILLNESS:  Gaither Biehn, returns for a for a neurologic follow-up visit regarding his pain and numbness and other symptoms.  Neck pain has done better with the combination of flexeril (5 mg at bedtime)and gabapentin 300 mg po tid.    Pain is mostly in the neck .   He occasionally gets some tingling in the arms but no longer feels pain there. He denies arm weakness.  Neck is sometimes stiff.   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    LBP:   He notes some pain still in the lower back and buttocks (R>L) and has some numbness in his legs but it is not painful now.  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   However, with his med's, the leg pain is much better.    LBP increases with pushing or lifting.     Sitting a long time increases his pain but stretching helps some.     Myalgias/Joint pain:   These are doing better since starting gabapentin but he gets muscle aches in large muscles at times, especially if sitting a long time.   Insomnia:  He feels sleep is better on his gabapentin and flexeril.   He now sleeps through the night most nights.  He occasionally has mild trouble falling asleep but is doing better with staying asleep.     Cognitive:  He notes mild memory cognitive difficulty but is better with the improved sleep.   He still has trouble getting the right words and with names.   MRI of the brain was normal.    Labs were normal..  REVIEW OF SYSTEMS: Constitutional: No fevers, chills, sweats, or change in appetite.  Has some fatigue.  Occasional insomnia Eyes: Notes some blurred vision.  No double vision, eye pain Ear, nose and throat: No hearing loss, ear pain, nasal congestion, sore throat Cardiovascular: No chest pain, palpitations Respiratory: Occ shortness of breath.   No wheezes GastrointestinaI: No nausea, vomiting, diarrhea, abdominal pain, fecal incontinence.  Reports gluten intolerance Genitourinary: No dysuria, urinary retention or frequency.  No nocturia. Musculoskeletal:Pain and stiffness in joints and muscles Integumentary: No rash, pruritus, skin lesions Neurological: as above Psychiatric: No depression but notes anxiety.   Some memory loss Endocrine: No palpitations, diaphoresis, change in appetite, change in weigh or increased thirst Hematologic/Lymphatic: No anemia, purpura, petechiae. Allergic/Immunologic: No itchy/runny eyes, nasal congestion, recent allergic reactions, rashes  ALLERGIES: Allergies  Allergen Reactions  . Gluten Meal Nausea And Vomiting    HOME MEDICATIONS:  Current outpatient prescriptions:  .  cyclobenzaprine (FLEXERIL) 5 MG tablet, Take 1 tablet (5 mg total) by mouth at bedtime., Disp: 30 tablet, Rfl: 5 .  fexofenadine (ALLEGRA) 30 MG tablet, Take 30 mg by mouth daily., Disp: , Rfl:  .  fluticasone (FLONASE) 50 MCG/ACT nasal spray, Place 1 spray into both nostrils as needed., Disp: , Rfl:  .  gabapentin (NEURONTIN) 300 MG capsule, One in am, one in evening and two po at bedtime, Disp: 120 capsule, Rfl: 3 .  lisinopril (PRINIVIL,ZESTRIL) 20 MG tablet, Take 20 mg by mouth daily., Disp: , Rfl:  .  omeprazole (PRILOSEC) 40 MG capsule, Take 40 mg by mouth daily., Disp: , Rfl:  .  ALPRAZolam (XANAX) 0.5 MG tablet, Take 1 tablet (0.5 mg total) by mouth once. Take one tablet 30 min. prior to mri.  Take 2nd tablet with you and repeat if needed. (Patient not  taking: Reported on 03/30/2015), Disp: 2 tablet, Rfl: 0  PAST MEDICAL HISTORY: Past Medical History  Diagnosis Date  . GERD (gastroesophageal reflux disease)   . Gluten intolerance   . Hyperlipidemia   . Hypertension   . Headache   . Hearing loss   . Memory loss   . Vision abnormalities     PAST SURGICAL HISTORY: History reviewed. No pertinent past surgical history.  FAMILY HISTORY: Family History  Problem Relation Age of Onset  . Hypertension Father   . Prostate cancer Father   . Bladder Cancer Mother     SOCIAL HISTORY:  Social History   Social History  . Marital Status: Married    Spouse Name: N/A  . Number of Children: N/A  . Years of Education: N/A   Occupational History  . Not on file.   Social History Main Topics  . Smoking status: Never Smoker   . Smokeless tobacco: Not on file  . Alcohol Use: Not on file  . Drug Use: Not on file  . Sexual Activity: Not on file   Other Topics Concern  . Not on file   Social History Narrative     PHYSICAL EXAM  Filed Vitals:   03/30/15 0856  BP: 114/72  Pulse: 76  Resp: 14  Height: 5' 7"  (1.702 m)  Weight: 198 lb 3.2 oz (89.903 kg)    Body mass index is 31.04 kg/(m^2).   General: The patient is well-developed and well-nourished and in no acute distress  Neck: The neck is supple, no carotid bruits are noted.  The neck is mildly tender in  paraspinal region and trapezius muscles.     Musculoskeletal:  His back is mildly tender over piriformis and lower lumbar paraspinals. Some tenderness at classic FMS tender points of upper back/chest.     Neurologic Exam  Mental status: The patient is alert and oriented x 3 at the time of the examination. The patient has apparent normal recent and remote memory, with an apparently normal attention span and concentration ability [facility]).   Speech is normal.  Cranial nerves: Extraocular movements are full.   Facial symmetry is present.    Facial strength is  normal.  Trapezius and sternocleidomastoid strength is normal. No dysarthria is noted.  The tongue is midline, and the patient has symmetric elevation of the soft palate. No obvious hearing deficits are noted.  Motor:  Muscle bulk is normal.   Tone is normal. Strength is  5 / 5 in all 4 extremities.   Sensory: Sensory testing is intact to touch  in all 4 extremities.  Coordination:   Finger to nose and rapid alternating motions are normal  Gait and station: Station is normal.   Gait and tandem walk are normal.   Tandem gait is normal.   Reflexes: Deep tendon reflexes are symmetric and normal bilaterally.     DIAGNOSTIC DATA (LABS, IMAGING, TESTING) - I reviewed patient records, labs, notes, testing and  imaging myself where available    ASSESSMENT AND PLAN   Neck pain  Insomnia  Midline low back pain without sciatica  Dysesthesia  Myalgia and myositis  Decreased attention Span    1.    Continue gabapentin and cyclobenzaprine to help pain, myalfias, sleep and spasms.   2.    Advised to stay active stretch and exercise as tolerated.     3.    RTC 6 months, sooner if problems   Call if nerw or worsening neurologis problems  Richard A. Felecia Shelling, MD, PhD 4/46/2863, 8:17 AM Certified in Neurology, Clinical Neurophysiology, Sleep Medicine, Pain Medicine and Neuroimaging  [facility] [address] [place], [place] [address] [phone]  g

## 2015-09-30 ENCOUNTER — Encounter: Payer: Self-pay | Admitting: Neurology

## 2015-09-30 ENCOUNTER — Ambulatory Visit (INDEPENDENT_AMBULATORY_CARE_PROVIDER_SITE_OTHER): Payer: 59 | Admitting: Neurology

## 2015-09-30 VITALS — BP 116/82 | HR 76 | Resp 16 | Ht 67.0 in | Wt 193.2 lb

## 2015-09-30 DIAGNOSIS — G4700 Insomnia, unspecified: Secondary | ICD-10-CM | POA: Diagnosis not present

## 2015-09-30 DIAGNOSIS — M5441 Lumbago with sciatica, right side: Secondary | ICD-10-CM | POA: Diagnosis not present

## 2015-09-30 DIAGNOSIS — M542 Cervicalgia: Secondary | ICD-10-CM

## 2015-09-30 DIAGNOSIS — IMO0001 Reserved for inherently not codable concepts without codable children: Secondary | ICD-10-CM

## 2015-09-30 DIAGNOSIS — M791 Myalgia: Secondary | ICD-10-CM | POA: Diagnosis not present

## 2015-09-30 DIAGNOSIS — M5416 Radiculopathy, lumbar region: Secondary | ICD-10-CM | POA: Diagnosis not present

## 2015-09-30 DIAGNOSIS — R208 Other disturbances of skin sensation: Secondary | ICD-10-CM | POA: Diagnosis not present

## 2015-09-30 DIAGNOSIS — M609 Myositis, unspecified: Secondary | ICD-10-CM

## 2015-09-30 DIAGNOSIS — M5442 Lumbago with sciatica, left side: Secondary | ICD-10-CM

## 2015-09-30 MED ORDER — GABAPENTIN 600 MG PO TABS: 600.0000 mg | ORAL_TABLET | Freq: Three times a day (TID) | ORAL | Status: DC

## 2015-09-30 NOTE — Progress Notes (Signed)
[facility]  PATIENT: Gary Weaver DOB: Dec 14, 1965  REFERRING DOCTOR OR PCP:  Pearson Grippe SOURCE: Patient  _________________________________   HISTORICAL  CHIEF COMPLAINT:  Chief Complaint  Patient presents with  . Neck Pain    Sts. neck pain is about the same, lbp is some worse.  Worse with prolonged sitting, cold weather. He saw his chiropractor yesterday and so pain is a little better today.  Sts. character of pain has changed--is now a sharp burning pain.  No known injury.  He continues to c/o numbness entire bilat legs.Sts. he sleeps ok if he takes Flexeril, but without Flexeril he only sleeps about 4 hours per night./fim  . Back Pain  . Insomnia    HISTORY OF PRESENT ILLNESS:  Gary Weaver, returns for a for a neurologic follow-up visit regarding his pain and numbness and other symptoms.  Neck Pain:   Neck pain is mild to moderate, better than initially but unchanged from last visit.  Some days pain is worse and some days pain is very mild.   He is on a combination of flexeril (5 mg at bedtime)and gabapentin 300 mg po tid.    Pain is mostly axial and he rarely notes arm pain now .   He used to have tingling in the arms but no longer feels pain there. He denies arm weakness.  Neck is sometimes stiff.   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    LBP:   He notes much more LBP that runs down legs at various times. Pain has been present a few years but increased 3-4 months ago.    The worse pain is in the lumbosacral junction.   He has some pain in the buttocks (R>L).    No numbness or weakness in legs.  The leg pain has been much better since being on gabapentin.   Pain increases with certain tasks (recently with washing dog) and sitting a long time.   Shifting and short walks help the pain.   He has done PT in the past but did not get much benefit.    He has never had an imaging  studies.  Myalgias/Joint pain:   The general myalgias are much milder with gabapentin.   He still gets some muscle spasms, often in back and proximal legs.      Insomnia:  He is sleeping well most nights on gabapentin and flexeril.   He now sleeps through the night most nights.  He occasionally has mild trouble falling asleep but is doing better with staying asleep.     Cognitive:  He notes mild memory cognitive difficulty but is better with the improved sleep.   He still has trouble getting the right words or how to spell a word. STM seems ok.     MRI of the brain was normal.   Labs were normal..  Mood:  He denies depression or anxiety.     REVIEW OF SYSTEMS: Constitutional: No fevers, chills, sweats, or change in appetite.  Has some fatigue.  Occasional insomnia Eyes: Notes some blurred vision.  No double vision, eye pain Ear, nose and throat: No hearing loss, ear pain, nasal congestion, sore throat Cardiovascular: No chest pain, palpitations Respiratory: Occ shortness of breath.   No wheezes GastrointestinaI: No nausea, vomiting, diarrhea, abdominal pain, fecal incontinence.  Reports gluten intolerance Genitourinary: No dysuria, urinary retention or frequency.  No nocturia. Musculoskeletal:Pain and stiffness  in joints and muscles Integumentary: No rash, pruritus, skin lesions Neurological: as above Psychiatric: No depression but notes anxiety.   Some memory loss Endocrine: No palpitations, diaphoresis, change in appetite, change in weigh or increased thirst Hematologic/Lymphatic: No anemia, purpura, petechiae. Allergic/Immunologic: No itchy/runny eyes, nasal congestion, recent allergic reactions, rashes  ALLERGIES: Allergies  Allergen Reactions  . Gluten Meal Nausea And Vomiting    HOME MEDICATIONS:  Current outpatient prescriptions:  .  ALPRAZolam (XANAX) 0.5 MG tablet, Take 1 tablet (0.5 mg total) by mouth once. Take one tablet 30 min. prior to mri.  Take 2nd tablet with  you and repeat if needed. (Patient not taking: Reported on 03/30/2015), Disp: 2 tablet, Rfl: 0 .  cyclobenzaprine (FLEXERIL) 5 MG tablet, Take 1 tablet (5 mg total) by mouth at bedtime., Disp: 30 tablet, Rfl: 6 .  fexofenadine (ALLEGRA) 30 MG tablet, Take 30 mg by mouth daily., Disp: , Rfl:  .  fluticasone (FLONASE) 50 MCG/ACT nasal spray, Place 1 spray into both nostrils as needed., Disp: , Rfl:  .  gabapentin (NEURONTIN) 300 MG capsule, One in am, one in evening and two po at bedtime, Disp: 120 capsule, Rfl: 6 .  lisinopril (PRINIVIL,ZESTRIL) 20 MG tablet, Take 20 mg by mouth daily., Disp: , Rfl:  .  omeprazole (PRILOSEC) 40 MG capsule, Take 40 mg by mouth daily., Disp: , Rfl:   PAST MEDICAL HISTORY: Past Medical History  Diagnosis Date  . GERD (gastroesophageal reflux disease)   . Gluten intolerance   . Hyperlipidemia   . Hypertension   . Headache   . Hearing loss   . Memory loss   . Vision abnormalities     PAST SURGICAL HISTORY: History reviewed. No pertinent past surgical history.  FAMILY HISTORY: Family History  Problem Relation Age of Onset  . Hypertension Father   . Prostate cancer Father   . Bladder Cancer Mother     SOCIAL HISTORY:  Social History   Social History  . Marital Status: Married    Spouse Name: N/A  . Number of Children: N/A  . Years of Education: N/A   Occupational History  . Not on file.   Social History Main Topics  . Smoking status: Never Smoker   . Smokeless tobacco: Not on file  . Alcohol Use: Not on file  . Drug Use: Not on file  . Sexual Activity: Not on file   Other Topics Concern  . Not on file   Social History Narrative     PHYSICAL EXAM  Filed Vitals:   09/30/15 0902  BP: 116/82  Pulse: 76  Resp: 16  Height:  (1.702 m)  Weight: 193 lb 3.2 oz (87.635 kg)    Body mass index is 30.25 kg/(m^2).   General: The patient is well-developed and well-nourished and in no acute distress  Neck: The neck is supple, no  carotid bruits are noted.  The neck is mildly tender in  paraspinal region and trapezius muscles.     Musculoskeletal:  His back is moderately tender over lower lumbar paraspinals > piriformis muscles.   Neurologic Exam  Mental status: The patient is alert and oriented x 3 at the time of the examination. The patient has apparent normal recent and remote memory, with an apparently normal attention span and concentration ability.   Speech is normal.  Cranial nerves: Extraocular movements are full.   Facial symmetry is present.    Facial strength is normal.  Trapezius and sternocleidomastoid strength is normal.  No dysarthria is noted.  The tongue is midline, and the patient has symmetric elevation of the soft palate. No obvious hearing deficits are noted.  Motor:  Muscle bulk is normal.   Tone is normal. Strength is  5 / 5 in all 4 extremities.   Sensory: Sensory testing is intact to touch  in all 4 extremities.  Coordination:   Finger to nose and rapid alternating motions are normal  Gait and station: Station is normal.   Gait and tandem walk are normal.   Tandem gait is normal.   Reflexes: Deep tendon reflexes are symmetric and normal bilaterally.     DIAGNOSTIC DATA (LABS, IMAGING, TESTING) - I reviewed patient records, labs, notes, testing and imaging myself where available    ASSESSMENT AND PLAN   Radiculopathy, lumbar region - Plan: MR Lumbar Spine Wo Contrast  Midline low back pain with bilateral sciatica, unspecified chronicity - Plan: MR Lumbar Spine Wo Contrast  Dysesthesia  Neck pain  Insomnia  Myalgia and myositis    1.    Continue gabapentin (increase to 600 mg po tid) and cyclobenzaprine to help pain, myalgias, sleep and spasms.   2.    Advised to stay active stretch and exercise as tolerated.     3.    TPI bilateral L4-L5 and L5-S1 paraspinal muscles with 80 mg Depo-Medrol in 5 mL Marcaine. He tolerated the procedure well. 4.    MRI Lumbar spine to evaluate  the radiculopathy. Consider referral to surgery if significant root impingement. RTC 6 months, sooner if problems   Call if nerw or worsening neurologis problems  Supreme Rybarczyk A. Epimenio Foot, MD, PhD 09/30/2015, 9:20 AM Certified in Neurology, Clinical Neurophysiology, Sleep Medicine, Pain Medicine and Neuroimaging  [facility] [address], [address] [place], [place] [address] [phone]  g

## 2015-10-07 ENCOUNTER — Ambulatory Visit (INDEPENDENT_AMBULATORY_CARE_PROVIDER_SITE_OTHER): Payer: Self-pay

## 2015-10-07 DIAGNOSIS — M5416 Radiculopathy, lumbar region: Secondary | ICD-10-CM

## 2015-10-07 DIAGNOSIS — Z0289 Encounter for other administrative examinations: Secondary | ICD-10-CM

## 2015-10-07 DIAGNOSIS — M5441 Lumbago with sciatica, right side: Secondary | ICD-10-CM

## 2015-10-07 DIAGNOSIS — M5442 Lumbago with sciatica, left side: Secondary | ICD-10-CM

## 2015-10-11 ENCOUNTER — Telehealth: Payer: Self-pay | Admitting: *Deleted

## 2015-10-11 NOTE — Telephone Encounter (Signed)
-----   Message from Asa Lente, MD sent at 10/11/2015  2:05 PM EST ----- Please elt him know the lumbar spine MRI is normal

## 2015-10-11 NOTE — Telephone Encounter (Signed)
LMTC./fim 

## 2015-10-11 NOTE — Telephone Encounter (Signed)
Noted/fim 

## 2015-10-11 NOTE — Telephone Encounter (Signed)
Pt returned Faith's call. Msg relayed to pt MRI is normal. Pt did not have any questions

## 2015-10-11 NOTE — Telephone Encounter (Signed)
-----   Message from Richard A Sater, MD sent at 10/11/2015  2:05 PM EST ----- Please elt him know the lumbar spine MRI is normal 

## 2015-11-09 ENCOUNTER — Other Ambulatory Visit: Payer: Self-pay | Admitting: Internal Medicine

## 2015-11-09 DIAGNOSIS — R1031 Right lower quadrant pain: Secondary | ICD-10-CM

## 2015-11-12 ENCOUNTER — Other Ambulatory Visit: Payer: Self-pay

## 2015-11-17 ENCOUNTER — Ambulatory Visit
Admission: RE | Admit: 2015-11-17 | Discharge: 2015-11-17 | Disposition: A | Discharge status: Home / Self Care | Payer: 59 | Source: Ambulatory Visit | Attending: Internal Medicine | Admitting: Internal Medicine

## 2015-11-17 DIAGNOSIS — R1031 Right lower quadrant pain: Secondary | ICD-10-CM

## 2015-11-17 MED ORDER — IOPAMIDOL (ISOVUE-300) INJECTION 61%
100.0000 mL | Freq: Once | INTRAVENOUS | Status: AC | PRN
  Administered 2015-11-17: 100 mL via INTRAVENOUS

## 2016-01-06 ENCOUNTER — Other Ambulatory Visit: Payer: Self-pay | Admitting: Neurology

## 2016-03-29 ENCOUNTER — Ambulatory Visit: Payer: 59 | Admitting: Neurology

## 2016-04-12 ENCOUNTER — Ambulatory Visit (INDEPENDENT_AMBULATORY_CARE_PROVIDER_SITE_OTHER): Payer: 59 | Admitting: Neurology

## 2016-04-12 ENCOUNTER — Encounter: Payer: Self-pay | Admitting: Neurology

## 2016-04-12 VITALS — BP 118/82 | HR 78 | Resp 14 | Ht 67.0 in | Wt 197.0 lb

## 2016-04-12 DIAGNOSIS — G4700 Insomnia, unspecified: Secondary | ICD-10-CM | POA: Diagnosis not present

## 2016-04-12 DIAGNOSIS — M545 Low back pain, unspecified: Secondary | ICD-10-CM

## 2016-04-12 DIAGNOSIS — M609 Myositis, unspecified: Secondary | ICD-10-CM | POA: Diagnosis not present

## 2016-04-12 DIAGNOSIS — M791 Myalgia: Secondary | ICD-10-CM

## 2016-04-12 DIAGNOSIS — M542 Cervicalgia: Secondary | ICD-10-CM

## 2016-04-12 DIAGNOSIS — IMO0001 Reserved for inherently not codable concepts without codable children: Secondary | ICD-10-CM

## 2016-04-12 MED ORDER — CYCLOBENZAPRINE HCL 5 MG PO TABS: 5.0000 mg | ORAL_TABLET | Freq: Every day | ORAL | 11 refills | Status: DC

## 2016-04-12 MED ORDER — GABAPENTIN 600 MG PO TABS: 600.0000 mg | ORAL_TABLET | Freq: Three times a day (TID) | ORAL | 11 refills | Status: DC

## 2016-04-12 MED ORDER — MELOXICAM 15 MG PO TABS: 15.0000 mg | ORAL_TABLET | Freq: Every day | ORAL | 11 refills | Status: DC

## 2016-04-12 NOTE — Progress Notes (Signed)
[facility]  PATIENT: Gary Weaver DOB: September 19, 1965  REFERRING DOCTOR OR PCP:  Gary Weaver SOURCE: Patient  _________________________________   HISTORICAL  CHIEF COMPLAINT:  Chief Complaint  Patient presents with  . Neck Pain    Sts. neck/back pain is some improved since tpi's given at last ov.  Sts. today he just has "my normal pain.  Only takes Gabapentin 674m bid because it makes him some sleepy and he works long hours.  He c/o more generalized joint pain with cooler, damp weather./fim  . Back Pain    HISTORY OF PRESENT ILLNESS:  Gary Weaver returns for a for a neurologic follow-up visit regarding his pain and numbness and other symptoms.   He feels his back pain is back to baseline after the TPI at the last injection.  He continues on flexeril and gabapentin.    MRI of the lumbar spine was normal 10/08/15.     Neck Pain:   Neck pain is about the same as last visit.   There is a dull ache but it is tolerable.     He is on a combination of flexeril (5 mg at bedtime)and gabapentin 600 mg po bid to tid.    Pain is mostly axial.   Denies current tingling in the arms but no longer feels pain there. He denies arm weakness.  Neck is sometimes stiff.   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    LBP:   He feels LBP is much better after TPI. MRI 10/08/15 was normal.       No numbness or weakness in legs.  The leg pain has been much better since being on gabapentin.   Pain increases at times with prolonged sitting.   Gabapentin and meloxicam help.  Myalgias/Joint pain:   The general myalgias are much milder with gabapentin.    Insomnia:  He is sleeping well most nights on gabapentin and flexeril.   He now sleeps through the night most nights.  He occasionally has mild trouble falling asleep but is doing better with staying asleep.      Mood:  He denies depression or anxiety.     REVIEW OF  SYSTEMS: Constitutional: No fevers, chills, sweats, or change in appetite.  Has some fatigue.  Occasional insomnia Eyes: Notes some blurred vision.  No double vision, eye pain Ear, nose and throat: No hearing loss, ear pain, nasal congestion, sore throat Cardiovascular: No chest pain, palpitations Respiratory: Occ shortness of breath.   No wheezes GastrointestinaI: No nausea, vomiting, diarrhea, abdominal pain, fecal incontinence.  Reports gluten intolerance Genitourinary: No dysuria, urinary retention or frequency.  No nocturia. Musculoskeletal:Pain and stiffness in joints and muscles Integumentary: No rash, pruritus, skin lesions Neurological: as above Psychiatric: No depression but notes anxiety.   Some memory loss Endocrine: No palpitations, diaphoresis, change in appetite, change in weigh or increased thirst Hematologic/Lymphatic: No anemia, purpura, petechiae. Allergic/Immunologic: No itchy/runny eyes, nasal congestion, recent allergic reactions, rashes  ALLERGIES: Allergies  Allergen Reactions  . Gluten Meal Nausea And Vomiting    HOME MEDICATIONS:  Current Outpatient Prescriptions:  .  cyclobenzaprine (FLEXERIL) 5 MG tablet, Take 1 tablet (5 mg total) by mouth at bedtime., Disp: 30 tablet, Rfl: 11 .  fexofenadine (ALLEGRA) 30 MG tablet, Take 30 mg by mouth daily., Disp: , Rfl:  .  fluticasone (FLONASE) 50 MCG/ACT nasal spray, Place 1 spray into both nostrils as needed., Disp: ,  Rfl:  .  gabapentin (NEURONTIN) 600 MG tablet, Take 1 tablet (600 mg total) by mouth 3 (three) times daily., Disp: 90 tablet, Rfl: 11 .  lisinopril (PRINIVIL,ZESTRIL) 20 MG tablet, Take 20 mg by mouth daily., Disp: , Rfl:  .  omeprazole (PRILOSEC) 40 MG capsule, Take 40 mg by mouth daily., Disp: , Rfl:  .  meloxicam (MOBIC) 15 MG tablet, Take 1 tablet (15 mg total) by mouth daily., Disp: 30 tablet, Rfl: 11  PAST MEDICAL HISTORY: Past Medical History:  Diagnosis Date  . GERD (gastroesophageal  reflux disease)   . Gluten intolerance   . Headache   . Hearing loss   . Hyperlipidemia   . Hypertension   . Memory loss   . Vision abnormalities     PAST SURGICAL HISTORY: No past surgical history on file.  FAMILY HISTORY: Family History  Problem Relation Age of Onset  . Hypertension Father   . Prostate cancer Father   . Bladder Cancer Mother     SOCIAL HISTORY:  Social History   Social History  . Marital status: Married    Spouse name: N/A  . Number of children: N/A  . Years of education: N/A   Occupational History  . Not on file.   Social History Main Topics  . Smoking status: Never Smoker  . Smokeless tobacco: Not on file  . Alcohol use Not on file  . Drug use: Unknown  . Sexual activity: Not on file   Other Topics Concern  . Not on file   Social History Narrative  . No narrative on file     PHYSICAL EXAM  Vitals:   04/12/16 0842  BP: 118/82  Pulse: 78  Resp: 14  Weight: 197 lb (89.4 kg)  Height: 5' 7"  (1.702 m)    Body mass index is 30.85 kg/m.   General: The patient is well-developed and well-nourished and in no acute distress  Neck: The neck is supple, no carotid bruits are noted.  The neck is mildly tender in  paraspinal region and trapezius muscles.     Musculoskeletal:  His back is mildly tender over lower lumbar paraspinals > piriformis muscles.   Neurologic Exam  Mental status: The patient is alert and oriented x 3 at the time of the examination. The patient has apparent normal recent and remote memory, with an apparently normal attention span and concentration ability.   Speech is normal.  Cranial nerves: Extraocular movements are full.   Facial symmetry is present.    Facial strength is normal.  Trapezius and sternocleidomastoid strength is normal. No dysarthria is noted.  The tongue is midline, and the patient has symmetric elevation of the soft palate. No obvious hearing deficits are noted.  Motor:  Muscle bulk is normal.    Tone is normal. Strength is  5 / 5 in all 4 extremities.   Sensory: Sensory testing is intact to touch  in all 4 extremities.  Coordination:   Finger to nose and rapid alternating motions are normal  Gait and station: Station is normal.   Gait and tandem walk are normal.   Tandem gait is normal.   Reflexes: Deep tendon reflexes are symmetric and normal bilaterally.     DIAGNOSTIC DATA (LABS, IMAGING, TESTING) - I reviewed patient records, labs, notes, testing and imaging myself where available    ASSESSMENT AND PLAN   Neck pain  Midline low back pain without sciatica  Insomnia  Myalgia and myositis   1.  Continue gabapentin and cyclobenzaprine and add meloxicam to help pain, myalgias, sleep and spasms.   2.    Advised to stay active stretch and exercise as tolerated.     3.     RTC 6 months, sooner if problems   Call if nerw or worsening neurologis problems  Richard A. Felecia Shelling, MD, PhD 2/58/3462, 1:94 AM Certified in Neurology, Clinical Neurophysiology, Sleep Medicine, Pain Medicine and Neuroimaging  [facility] [address], [place], [place] [address] [phone]  g

## 2016-10-03 DIAGNOSIS — K900 Celiac disease: Secondary | ICD-10-CM | POA: Insufficient documentation

## 2016-10-11 ENCOUNTER — Ambulatory Visit: Payer: 59 | Admitting: Neurology

## 2016-11-14 ENCOUNTER — Encounter: Payer: Self-pay | Admitting: Neurology

## 2016-11-14 ENCOUNTER — Ambulatory Visit (INDEPENDENT_AMBULATORY_CARE_PROVIDER_SITE_OTHER): Payer: BLUE CROSS/BLUE SHIELD | Admitting: Neurology

## 2016-11-14 VITALS — BP 129/89 | HR 81 | Resp 16 | Ht 67.0 in | Wt 196.0 lb

## 2016-11-14 DIAGNOSIS — R0683 Snoring: Secondary | ICD-10-CM | POA: Insufficient documentation

## 2016-11-14 DIAGNOSIS — M791 Myalgia, unspecified site: Secondary | ICD-10-CM

## 2016-11-14 DIAGNOSIS — G4700 Insomnia, unspecified: Secondary | ICD-10-CM | POA: Diagnosis not present

## 2016-11-14 DIAGNOSIS — R41840 Attention and concentration deficit: Secondary | ICD-10-CM

## 2016-11-14 DIAGNOSIS — R208 Other disturbances of skin sensation: Secondary | ICD-10-CM | POA: Diagnosis not present

## 2016-11-14 MED ORDER — AMPHETAMINE-DEXTROAMPHET ER 20 MG PO CP24: 20.0000 mg | ORAL_CAPSULE | Freq: Every day | ORAL | 0 refills | Status: DC

## 2016-11-14 MED ORDER — GABAPENTIN 600 MG PO TABS: 600.0000 mg | ORAL_TABLET | Freq: Three times a day (TID) | ORAL | 11 refills | Status: DC

## 2016-11-14 MED ORDER — CYCLOBENZAPRINE HCL 5 MG PO TABS: 5.0000 mg | ORAL_TABLET | Freq: Every day | ORAL | 11 refills | Status: DC

## 2016-11-14 NOTE — Progress Notes (Signed)
[facility]  PATIENT: Gary Weaver DOB: 01/09/1966  REFERRING DOCTOR OR PCP:  Jani Gravel SOURCE: Patient  _________________________________   HISTORICAL  CHIEF COMPLAINT:  Chief Complaint  Patient presents with  . Neck Pain    Sts. neck and back pain are about the same.  Takes Flexeril, Gabapentin and Meloxicam prn.  Sts. he feels difficulty with word finding is worse. Sts. generalized intermittent numbness, tingling is more frequent.   . Back Pain    HISTORY OF PRESENT ILLNESS:  Gary Weaver is a 51 yo man with pain,  Numbness, memory issues and insomnia.     Memory/word finding:   He is noting difficulty with word finding and memory.   He loses his train of thought easily.    He does not note any correlation with sleep (sometimes poor).    11/06/2014 MRI of the brain was normal.     Numbness/myalgias:  He notes left facial numbness and bilateral hand numbness.   He has tingling in the hands.   He notes pain in the muscles of the arms, currently worse on the left.     He is on gabapentin an Neurontin,     LBP:    He feels his back pain is about the same. TPI helped in the past  He continues on flexeril and gabapentin.    MRI of the lumbar spine was normal 10/08/15.     L-spine MRI 2017 was normal  Neck Pain:   His axial neck pain is about the same as last visit with a dull ache but it is tolerable.     He is on a Flexeril (5 mg at bedtime) and gabapentin 600 mg po bid to tid.  He denies arm weakness. His neck is sometimes stiff.   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    LBP:   He feels LBP is much better after TPI. MRI 10/08/15 was normal.       No numbness or weakness in legs.  The leg pain has been much better since being on gabapentin.   Pain increases at times with prolonged sitting.   Gabapentin and meloxicam help.   Insomnia:  He is sleeping well most nights on gabapentin  and flexeril.   He now sleeps through the night most nights.  He occasionally has mild trouble falling asleep but is doing better with staying asleep.     He snores but wife has not commented on gasping or pauses.    He does not have daytime sleepiness   Mood:  He denies depression or anxiety.     REVIEW OF SYSTEMS: Constitutional: No fevers, chills, sweats, or change in appetite.  Has some fatigue.  Occasional insomnia Eyes: Notes some blurred vision.  No double vision, eye pain Ear, nose and throat: No hearing loss, ear pain, nasal congestion, sore throat Cardiovascular: No chest pain, palpitations Respiratory: Occ shortness of breath.   No wheezes GastrointestinaI: No nausea, vomiting, diarrhea, abdominal pain, fecal incontinence.  Reports gluten intolerance Genitourinary: No dysuria, urinary retention or frequency.  No nocturia. Musculoskeletal:Pain and stiffness in joints and muscles Integumentary: No rash, pruritus, skin lesions Neurological: as above Psychiatric: No depression but notes anxiety.   Some memory loss Endocrine: No palpitations, diaphoresis, change in appetite, change in weigh or increased thirst Hematologic/Lymphatic: No anemia, purpura, petechiae. Allergic/Immunologic: No itchy/runny eyes, nasal congestion, recent allergic reactions, rashes  ALLERGIES: Allergies  Allergen Reactions  . Gluten Meal Nausea And Vomiting    HOME MEDICATIONS:  Current Outpatient Prescriptions:  .  cyclobenzaprine (FLEXERIL) 5 MG tablet, Take 1 tablet (5 mg total) by mouth at bedtime., Disp: 30 tablet, Rfl: 11 .  fexofenadine (ALLEGRA) 30 MG tablet, Take 30 mg by mouth daily., Disp: , Rfl:  .  fluticasone (FLONASE) 50 MCG/ACT nasal spray, Place 1 spray into both nostrils as needed., Disp: , Rfl:  .  gabapentin (NEURONTIN) 600 MG tablet, Take 1 tablet (600 mg total) by mouth 3 (three) times daily., Disp: 90 tablet, Rfl: 11 .  lisinopril (PRINIVIL,ZESTRIL) 20 MG tablet, Take 20 mg by  mouth daily., Disp: , Rfl:  .  meloxicam (MOBIC) 15 MG tablet, Take 1 tablet (15 mg total) by mouth daily., Disp: 30 tablet, Rfl: 11 .  omeprazole (PRILOSEC) 40 MG capsule, Take 40 mg by mouth daily., Disp: , Rfl:   PAST MEDICAL HISTORY: Past Medical History:  Diagnosis Date  . GERD (gastroesophageal reflux disease)   . Gluten intolerance   . Headache   . Hearing loss   . Hyperlipidemia   . Hypertension   . Memory loss   . Vision abnormalities     PAST SURGICAL HISTORY: No past surgical history on file.  FAMILY HISTORY: Family History  Problem Relation Age of Onset  . Hypertension Father   . Prostate cancer Father   . Bladder Cancer Mother     SOCIAL HISTORY:  Social History   Social History  . Marital status: Married    Spouse name: N/A  . Number of children: N/A  . Years of education: N/A   Occupational History  . Not on file.   Social History Main Topics  . Smoking status: Never Smoker  . Smokeless tobacco: Never Used  . Alcohol use Not on file  . Drug use: Unknown  . Sexual activity: Not on file   Other Topics Concern  . Not on file   Social History Narrative  . No narrative on file     PHYSICAL EXAM  Vitals:   11/14/16 1532  BP: 129/89  Pulse: 81  Resp: 16  Weight: 196 lb (88.9 kg)  Height: 5' 7"  (1.702 m)    Body mass index is 30.7 kg/m.   General: The patient is well-developed and well-nourished and in no acute distress   Musculoskeletal:  She has mild tenderness over the lower lumbar paraspinal muscles and piriformis muscles   Neurologic Exam  Mental status: The patient is alert and oriented x 3 at the time of the examination. The patient has apparent normal recent and remote memory, with an apparently normal attention span and concentration ability.   Speech is normal.  Cranial nerves: Extraocular movements are full.   Facial symmetry is present.    Facial strength is normal.  Trapezius and sternocleidomastoid strength is normal.  No dysarthria is noted.  On a few occasions he was slow coming up with the right word  .  The tongue is midline, and the patient has symmetric elevation of the soft palate. No obvious hearing deficits are noted.  Motor:  Muscle bulk is normal.   Tone is normal. Strength is  5 / 5 in all 4 extremities.   Sensory: Sensory testing is intact to touch  in all 4 extremities.  Coordination:   Finger to nose and rapid alternating motions are normal  Gait and station: Station is normal.   Gait and tandem walk are normal.  Tandem gait is normal.   Reflexes: Deep tendon reflexes are symmetric and normal bilaterally.     DIAGNOSTIC DATA (LABS, IMAGING, TESTING) - I reviewed patient records, labs, notes, testing and imaging myself where available    ASSESSMENT AND PLAN   Decreased attention Span  Insomnia, unspecified type  Dysesthesia  Myalgia  Snoring   1.    Continue gabapentin and cyclobenzaprine for myalgias, sleep and spasms.   2.    Trial of Adderall for poor focus and mild cognitive issues.   If not better consider brain MRi 3.    Stay active and exercise as tolerated.     3.     RTC 6 months, sooner if problems   Call if new or worsening neurologis problems  45 minute face-to-face evaluation with greater than one of the time counseling or coordinating care about his(since.  Richard A. Felecia Shelling, MD, PhD 08/19/567, 7:94 PM Certified in Neurology, Clinical Neurophysiology, Sleep Medicine, Pain Medicine and Neuroimaging  [facility] [address], [place], [place] [address] [phone]  g

## 2016-11-15 ENCOUNTER — Telehealth: Payer: Self-pay

## 2016-11-15 LAB — COMPREHENSIVE METABOLIC PANEL
ALK PHOS: 54 IU/L (ref 39–117)
ALT: 32 IU/L (ref 0–44)
AST: 21 IU/L (ref 0–40)
Albumin/Globulin Ratio: 2.5 — ABNORMAL HIGH (ref 1.2–2.2)
Albumin: 4.9 g/dL (ref 3.5–5.5)
BILIRUBIN TOTAL: 0.6 mg/dL (ref 0.0–1.2)
BUN / CREAT RATIO: 12 (ref 9–20)
BUN: 13 mg/dL (ref 6–24)
CHLORIDE: 105 mmol/L (ref 96–106)
CO2: 27 mmol/L (ref 18–29)
Calcium: 10.3 mg/dL — ABNORMAL HIGH (ref 8.7–10.2)
Creatinine, Ser: 1.08 mg/dL (ref 0.76–1.27)
GFR calc Af Amer: 92 mL/min/{1.73_m2} (ref >59)
GFR calc non Af Amer: 80 mL/min/{1.73_m2} (ref >59)
GLUCOSE: 108 mg/dL — AB (ref 65–99)
Globulin, Total: 2 g/dL (ref 1.5–4.5)
Potassium: 5.1 mmol/L (ref 3.5–5.2)
Sodium: 144 mmol/L (ref 134–144)
Total Protein: 6.9 g/dL (ref 6.0–8.5)

## 2016-11-15 LAB — TSH: TSH: 1.35 u[IU]/mL (ref 0.450–4.500)

## 2016-11-15 NOTE — Telephone Encounter (Signed)
-----   Message from Asa Lente, MD sent at 11/15/2016  8:44 AM EDT ----- Please let the patient know that the lab work is fine.

## 2016-11-15 NOTE — Telephone Encounter (Signed)
I called pt to discuss his lab work. No answer, left a message asking him to call me back. 

## 2016-11-15 NOTE — Telephone Encounter (Signed)
I called pt. I advised him that Dr. Epimenio Foot looked at his labs and wanted to let pt know that they looked fine. Pt verbalized understanding of results. Pt had no questions at this time but was encouraged to call back if questions arise.

## 2017-04-03 ENCOUNTER — Other Ambulatory Visit: Payer: Self-pay | Admitting: Legal Medicine

## 2017-04-03 DIAGNOSIS — R10817 Generalized abdominal tenderness: Secondary | ICD-10-CM

## 2017-04-03 DIAGNOSIS — R109 Unspecified abdominal pain: Secondary | ICD-10-CM

## 2017-04-04 ENCOUNTER — Other Ambulatory Visit: Payer: 59

## 2017-04-23 ENCOUNTER — Encounter: Payer: Self-pay | Admitting: Gastroenterology

## 2017-05-01 ENCOUNTER — Other Ambulatory Visit: Payer: Self-pay | Admitting: *Deleted

## 2017-05-01 ENCOUNTER — Encounter: Payer: Self-pay | Admitting: *Deleted

## 2017-05-07 ENCOUNTER — Other Ambulatory Visit (INDEPENDENT_AMBULATORY_CARE_PROVIDER_SITE_OTHER): Payer: BLUE CROSS/BLUE SHIELD

## 2017-05-07 ENCOUNTER — Ambulatory Visit (INDEPENDENT_AMBULATORY_CARE_PROVIDER_SITE_OTHER): Payer: BLUE CROSS/BLUE SHIELD | Admitting: Gastroenterology

## 2017-05-07 ENCOUNTER — Encounter (INDEPENDENT_AMBULATORY_CARE_PROVIDER_SITE_OTHER): Payer: Self-pay

## 2017-05-07 ENCOUNTER — Encounter: Payer: Self-pay | Admitting: Gastroenterology

## 2017-05-07 VITALS — BP 88/64 | Ht 66.0 in | Wt 187.6 lb

## 2017-05-07 DIAGNOSIS — K861 Other chronic pancreatitis: Secondary | ICD-10-CM

## 2017-05-07 DIAGNOSIS — R1013 Epigastric pain: Secondary | ICD-10-CM

## 2017-05-07 DIAGNOSIS — Z8719 Personal history of other diseases of the digestive system: Secondary | ICD-10-CM

## 2017-05-07 LAB — BASIC METABOLIC PANEL
BUN: 13 mg/dL (ref 6–23)
CALCIUM: 10 mg/dL (ref 8.4–10.5)
CO2: 30 meq/L (ref 19–32)
CREATININE: 1.14 mg/dL (ref 0.40–1.50)
Chloride: 106 mEq/L (ref 96–112)
GFR: 72.02 mL/min (ref >60.00)
GLUCOSE: 108 mg/dL — AB (ref 70–99)
Potassium: 4.7 mEq/L (ref 3.5–5.1)
Sodium: 143 mEq/L (ref 135–145)

## 2017-05-07 LAB — CBC WITH DIFFERENTIAL/PLATELET
Basophils Absolute: 0 10*3/uL (ref 0.0–0.1)
Basophils Relative: 0.6 % (ref 0.0–3.0)
EOS PCT: 1.9 % (ref 0.0–5.0)
Eosinophils Absolute: 0.1 10*3/uL (ref 0.0–0.7)
HCT: 42.1 % (ref 39.0–52.0)
Hemoglobin: 14 g/dL (ref 13.0–17.0)
LYMPHS ABS: 0.9 10*3/uL (ref 0.7–4.0)
Lymphocytes Relative: 16.8 % (ref 12.0–46.0)
MCHC: 33.3 g/dL (ref 30.0–36.0)
MCV: 91.3 fl (ref 78.0–100.0)
MONOS PCT: 11.5 % (ref 3.0–12.0)
Monocytes Absolute: 0.6 10*3/uL (ref 0.1–1.0)
NEUTROS PCT: 69.2 % (ref 43.0–77.0)
Neutro Abs: 3.7 10*3/uL (ref 1.4–7.7)
Platelets: 219 10*3/uL (ref 150.0–400.0)
RBC: 4.61 Mil/uL (ref 4.22–5.81)
RDW: 12.8 % (ref 11.5–15.5)
WBC: 5.3 10*3/uL (ref 4.0–10.5)

## 2017-05-07 LAB — LIPASE: LIPASE: 17 U/L (ref 11.0–59.0)

## 2017-05-07 MED ORDER — LORAZEPAM 0.5 MG PO TABS: ORAL_TABLET | ORAL | 0 refills | Status: DC

## 2017-05-07 NOTE — Patient Instructions (Signed)
Please go to the basement level to have your labs drawn.  We have sent a prescription for 1 Ativan tablet to take to your pharmacy.  You will take the medication with you to the appointment for the MRI.  You should have a driver.   Consider discontinuing the Lisinopril. Discuss with your Primary Care Physician.   You have been scheduled for an MRI at [facility] [address],the building next to [facility].  On Monday 05-14-2017. Your appointment time is 9:00 . Please arrive at 8:45 am to your appointment time for registration purposes. Please make certain not to have anything to eat or drink 4hours prior to your test. In addition, if you have any metal in your body, have a pacemaker or defibrillator, please be sure to let your ordering physician know. This test typically takes 45 minutes to 1 hour to complete. Should you need to reschedule, please call [phone] to do so.

## 2017-05-07 NOTE — Progress Notes (Addendum)
05/07/2017 Gary Weaver [id number] 1966-01-11   HISTORY OF PRESENT ILLNESS:  This is a pleasant 51 year old male who reports that he was seen here years ago and diagnosed with celiac disease. Follows a gluten-free diet closely. He is here today at the request of his PCP, Dr. Henrene Pastor, for evaluation regarding a recent episode of acute pancreatitis. The patient was in the ER at [facility] on 04/04/2017. He tells me that a couple days prior to his ER visit he started developing right upper quadrant epigastric abdominal pain. This just progressively became very severe. In the emergency department he underwent CT scan of the abdomen and pelvis with contrast. This showed mild inflammatory changes around the pancreatic head mass concerning for mild pancreatitis. The pancreas enhanced normally and homogeneously without areas of necrosis.  The gallbladder and biliary tree were normal. Ultrasound was also performed and showed a CBD of 4 mm and no sign of gallstones within the gallbladder. Interestingly his lipase was normal at 96 (this is considered normal on their lipase scale).  Amylase is normal at 41. Total bili was slightly elevated at 1.7, but remaining LFTs were normal. White blood cell count slightly elevated at 12,000. BMP normal. He was offered to be admitted but said that he could try to manage this as an outpatient.  He first went on a clear liquid diet and then progressed to some other foods, but has been eating very little fat. Between his gluten-free diet and this very low-fat diet is unsure of what he really can be and should be eating. He says that he has lost some weight since this occurred. He continues to have more of a discomfort rather than pain in his epigastrium and right upper quadrant.  He denies excessive alcohol use. He tells me that he maybe has a total of 4 beers on the weekends, 2 Friday night and 2 Saturday night. Has never had pancreatitis in the past. He is on lisinopril for  blood pressure and has been on that for quite some time.  He also admits to reflux issues for which he takes omeprazole 40 mg daily. He has Reglan 5 mg as needed on his medication list and he says that that was prescribed for his reflux issues a long time ago and he takes that when his reflux is very severe.  Tells me that he had colonoscopy by Dr. Lyndel Safe in [place] within the past 5 years that was normal. He does not want to continue to follow there.   Past Medical History:  Diagnosis Date  . Arrhythmia   . GERD (gastroesophageal reflux disease)   . Gluten intolerance   . Headache   . Hearing loss   . Hyperlipidemia   . Hypertension   . Memory loss   . Pancreatitis   . Vision abnormalities    History reviewed. No pertinent surgical history.  reports that he has never smoked. He has never used smokeless tobacco. He reports that he drinks alcohol. He reports that he does not use drugs. family history includes Bladder Cancer in his mother; Hypertension in his father; Prostate cancer in his father. Allergies  Allergen Reactions  . Gluten Meal Nausea And Vomiting      Outpatient Encounter Prescriptions as of 05/07/2017  Medication Sig  . amphetamine-dextroamphetamine (ADDERALL XR) 20 MG 24 hr capsule Take 1 capsule (20 mg total) by mouth daily. (Patient taking differently: Take 20 mg by mouth daily as needed. )  . cyclobenzaprine (FLEXERIL) 5 MG  tablet Take 1 tablet (5 mg total) by mouth at bedtime. (Patient taking differently: Take 5 mg by mouth as needed. )  . fexofenadine (ALLEGRA) 30 MG tablet Take 30 mg by mouth daily as needed.   . fluticasone (FLONASE) 50 MCG/ACT nasal spray Place 1 spray into both nostrils as needed.  . gabapentin (NEURONTIN) 600 MG tablet Take 1 tablet (600 mg total) by mouth 3 (three) times daily. (Patient taking differently: Take 600 mg by mouth 3 (three) times daily as needed. )  . lisinopril (PRINIVIL,ZESTRIL) 20 MG tablet Take 20 mg by mouth daily.  .  meloxicam (MOBIC) 15 MG tablet Take 1 tablet (15 mg total) by mouth daily.  . metoCLOPramide (REGLAN) 5 MG tablet Take 5 mg by mouth as needed.  Marland Kitchen omeprazole (PRILOSEC) 40 MG capsule Take 40 mg by mouth daily.  . [DISCONTINUED] lisinopril (PRINIVIL,ZESTRIL) 20 MG tablet Take 20 mg by mouth daily.   No facility-administered encounter medications on file as of 05/07/2017.      REVIEW OF SYSTEMS  : All other systems reviewed and negative except where noted in the History of Present Illness.   PHYSICAL EXAM: BP (!) 88/64   Ht 5' 6"  (1.676 m)   Wt 187 lb 9.6 oz (85.1 kg)   BMI 30.28 kg/m  General: Well developed white male in no acute distress Head: Normocephalic and atraumatic Eyes:  Sclerae anicteric, conjunctiva pink. Ears: Normal auditory acuity Lungs: Clear throughout to auscultation; no increased WOB. Heart: Regular rate and rhythm; no M/R/G. Abdomen: Soft, non-distended.  BS present.  Mild epigastric TTP. Musculoskeletal: Symmetrical with no gross deformities  Skin: No lesions on visible extremities Extremities: No edema  Neurological: Alert oriented x 4, grossly non-focal Psychological:  Alert and cooperative. Normal mood and affect  ASSESSMENT AND PLAN: *51 year old male with recent episode of acute pancreatitis, first occurrence:  Changes seen on CT scan patient with severe upper abdominal pain c/w with such but lipase and amylase normal.  No gallstones seen on imaging.  Only occasional ETOH use.  Unclear source at this point. He is on lisinopril and that medication has been linked to pancreatitis. I have asked him to discuss discontinuation of this medication with his PCP. I'm going to order labs including a repeat CBC, BMP, hepatic function panel, and lipase. We'll also check an IgG4 to rule out autoimmune pancreatitis. We'll also schedule him for MRI abdomen/MRCP to further evaluate the pancreas and rule out pancreatic divisum, etc.  **Will obtain previous colonoscopy records  from Dr. Lyndel Safe.  **Addedum:  We did receive his colonoscopy records. His colonoscopy was in August 2009 at which time he was found have mild sigmoid diverticulosis and small internal hemorrhoids but otherwise colonoscopy was normal to the terminal ileum. Random colon biopsies were unremarkable with prominent lymphoid aggregate. Terminal ileal biopsies were unremarkable. He also went underwent an EGD at that time which showed only mild gastritis. Small bowel biopsies were also unremarkable with architecturally normal July.   CC:  Jani Gravel, MD CC:  Dr. Reinaldo Meeker

## 2017-05-08 ENCOUNTER — Other Ambulatory Visit: Payer: Self-pay | Admitting: *Deleted

## 2017-05-08 DIAGNOSIS — K859 Acute pancreatitis without necrosis or infection, unspecified: Secondary | ICD-10-CM

## 2017-05-08 DIAGNOSIS — R1013 Epigastric pain: Secondary | ICD-10-CM

## 2017-05-09 ENCOUNTER — Telehealth: Payer: Self-pay

## 2017-05-09 ENCOUNTER — Other Ambulatory Visit: Payer: Self-pay

## 2017-05-09 ENCOUNTER — Other Ambulatory Visit: Payer: Self-pay | Admitting: *Deleted

## 2017-05-09 ENCOUNTER — Encounter: Payer: Self-pay | Admitting: Gastroenterology

## 2017-05-09 DIAGNOSIS — K861 Other chronic pancreatitis: Secondary | ICD-10-CM | POA: Insufficient documentation

## 2017-05-09 DIAGNOSIS — K859 Acute pancreatitis without necrosis or infection, unspecified: Secondary | ICD-10-CM

## 2017-05-09 DIAGNOSIS — R1013 Epigastric pain: Secondary | ICD-10-CM | POA: Insufficient documentation

## 2017-05-09 DIAGNOSIS — Z8719 Personal history of other diseases of the digestive system: Secondary | ICD-10-CM

## 2017-05-09 HISTORY — DX: Personal history of other diseases of the digestive system: Z87.19

## 2017-05-09 NOTE — Telephone Encounter (Signed)
-----   Message from Leta Baptist, PA-C sent at 05/09/2017 12:06 PM EDT ----- I saw this patient in the office on Monday. Can you please see if he can return to have a fasting triglyceride level drawn. Very high triglycerides can cause pancreatitis as well and I forgot to have that lab drawn when he was here.  Thank you,  Jess

## 2017-05-09 NOTE — Telephone Encounter (Signed)
The patient called in today regarding the MR/MRCP Abd test he is having on 05-14-2017.  It was approved by [employer]. ( CPT code is [phone]). The patient spoke to someone from [facility] regarding the test and asked him for a deposit which he paid over the phone.  The price he was quoted for the test was 2500.00.  He has met his deductible and has to pay 10 %.  He called several other places over by [facility] and was quoted a price of approximatly 1300.00.  He ask me if he could change the location. I told him yes. I called and ordered it at [facility] [place]  The patient is now scheduled for Sunday 05-20-2017 . He is to arrive at 7:50 am for an 8:10 appointment.  The patient has been informed and is fine with this change.  He is calling [facility] to cancel the original appointment we made for 05-14-2017.  He will also get his deposit back.

## 2017-05-09 NOTE — Telephone Encounter (Signed)
The pt has been advised and will come in for fasting labs as soon as convenient.

## 2017-05-09 NOTE — Telephone Encounter (Signed)
The pt has questions regarding his MRI appt.  Pam can you please speak with him.  He saw Shanda Bumps on 9/24.  Thanks

## 2017-05-09 NOTE — Telephone Encounter (Signed)
Zehr, Princella Pellegrini, PA-C sent to Brandt Loosen, RN        I just sent you another note on this patient about returning for triglyceride level. While he speak with him please let him know that his other labs were normal, including his lipase that is 17.   Thank you,   Jess

## 2017-05-09 NOTE — Progress Notes (Signed)
Thank you for sending this case to me. I have reviewed the entire note, and the outlined plan seems appropriate.  Sounds like it is not entirely clear if this was truly pancreatitis.  Hard to know what to make of reported mild stranding around pancreatic head with normal lipase.  Even though 96 is elevated for our lab, it is not nearly the elevation expected for acute pancreatitis. He should have fasting triglycerides and a calcium level checked if not already done in ED or with PCP.  I would manage this conservatively and see if he even gets another such episode.  Amada Jupiter, MD

## 2017-05-09 NOTE — Telephone Encounter (Signed)
Patient returning call to [facility].

## 2017-05-10 ENCOUNTER — Telehealth: Payer: Self-pay | Admitting: *Deleted

## 2017-05-10 ENCOUNTER — Other Ambulatory Visit (INDEPENDENT_AMBULATORY_CARE_PROVIDER_SITE_OTHER): Payer: BLUE CROSS/BLUE SHIELD

## 2017-05-10 DIAGNOSIS — R1013 Epigastric pain: Secondary | ICD-10-CM | POA: Diagnosis not present

## 2017-05-10 DIAGNOSIS — K859 Acute pancreatitis without necrosis or infection, unspecified: Secondary | ICD-10-CM

## 2017-05-10 LAB — HEPATIC FUNCTION PANEL
ALT: 15 U/L (ref 0–53)
AST: 13 U/L (ref 0–37)
Albumin: 4.6 g/dL (ref 3.5–5.2)
Alkaline Phosphatase: 42 U/L (ref 39–117)
BILIRUBIN DIRECT: 0.2 mg/dL (ref 0.0–0.3)
BILIRUBIN TOTAL: 1.1 mg/dL (ref 0.2–1.2)
Total Protein: 6.7 g/dL (ref 6.0–8.3)

## 2017-05-10 LAB — TRIGLYCERIDES: Triglycerides: 101 mg/dL (ref 0.0–149.0)

## 2017-05-10 NOTE — Telephone Encounter (Signed)
I called [facility] and I asked if this patient called and rescheduled his MR/.MRCP. I had scheduled it yesterday 9-27 for Sunday 05-20-2017 at 8:15 am.  I noticed in Epic it is now 05-16-2017 at 12:15 PM. She said the patient did reschedule.

## 2017-05-14 ENCOUNTER — Ambulatory Visit (HOSPITAL_COMMUNITY): Payer: BLUE CROSS/BLUE SHIELD

## 2017-05-16 ENCOUNTER — Telehealth: Payer: Self-pay | Admitting: Gastroenterology

## 2017-05-16 ENCOUNTER — Ambulatory Visit
Admission: RE | Admit: 2017-05-16 | Discharge: 2017-05-16 | Disposition: A | Discharge status: Home / Self Care | Payer: BLUE CROSS/BLUE SHIELD | Source: Ambulatory Visit | Attending: Gastroenterology | Admitting: Gastroenterology

## 2017-05-16 ENCOUNTER — Ambulatory Visit: Payer: BLUE CROSS/BLUE SHIELD | Admitting: Neurology

## 2017-05-16 DIAGNOSIS — Z8719 Personal history of other diseases of the digestive system: Secondary | ICD-10-CM

## 2017-05-16 MED ORDER — GADOBENATE DIMEGLUMINE 529 MG/ML IV SOLN
17.0000 mL | Freq: Once | INTRAVENOUS | Status: AC | PRN
  Administered 2017-05-16: 17 mL via INTRAVENOUS

## 2017-05-16 NOTE — Telephone Encounter (Signed)
See results note. 

## 2017-05-20 ENCOUNTER — Other Ambulatory Visit: Payer: BLUE CROSS/BLUE SHIELD

## 2017-07-17 ENCOUNTER — Telehealth: Payer: Self-pay | Admitting: *Deleted

## 2017-07-17 ENCOUNTER — Ambulatory Visit: Payer: BLUE CROSS/BLUE SHIELD | Admitting: Neurology

## 2017-07-17 NOTE — Telephone Encounter (Signed)
Pt. noshowed appt. with Dr. Epimenio FootSater today/fim

## 2017-07-18 ENCOUNTER — Encounter: Payer: Self-pay | Admitting: Neurology

## 2017-11-21 ENCOUNTER — Encounter: Payer: Self-pay | Admitting: Gastroenterology

## 2017-11-28 ENCOUNTER — Encounter: Payer: Self-pay | Admitting: Gastroenterology

## 2017-11-28 ENCOUNTER — Ambulatory Visit: Payer: BLUE CROSS/BLUE SHIELD | Admitting: Gastroenterology

## 2017-11-28 VITALS — BP 118/74 | HR 68 | Ht 66.0 in | Wt 187.0 lb

## 2017-11-28 DIAGNOSIS — R1013 Epigastric pain: Secondary | ICD-10-CM | POA: Diagnosis not present

## 2017-11-28 DIAGNOSIS — R1011 Right upper quadrant pain: Secondary | ICD-10-CM | POA: Diagnosis not present

## 2017-11-28 DIAGNOSIS — K219 Gastro-esophageal reflux disease without esophagitis: Secondary | ICD-10-CM

## 2017-11-28 NOTE — Progress Notes (Addendum)
11/28/2017 Gary Weaver [id number] [birth year]   HISTORY OF PRESENT ILLNESS: This is a 52 year old male who was seen by me in the fall 2018 for follow-up of a possible episode of acute pancreatitis.  Please see my note from May 07, 2017 regarding history at that time.  Anyway, I am unsure if that is what his diagnosis really was.  He had a follow-up MRI abdomen/MRCP since that was unremarkable.  He returns to our office today with complaints of right upper quadrant abdominal pain after eating.  He tells me that he follows a very bland low-fat diet with no more than 20 g of fat per day.  He says that if he eats anything with any more significant amount of fat than that then he developed right upper quadrant abdominal pain.  He also complains of some epigastric pain as well.  He has severe reflux for several years.  He is currently on omeprazole 40 mg daily, but says that he still gets a lot of reflux despite that medication.  Recent CBC, CMP, amylase, and lipase were normal just about one week ago.  Past Medical History:  Diagnosis Date  . Arrhythmia   . GERD (gastroesophageal reflux disease)   . Gluten intolerance   . Headache   . Hearing loss   . Hyperlipidemia   . Hypertension   . Memory loss   . Pancreatitis   . Vision abnormalities    History reviewed. No pertinent surgical history.  reports that he has never smoked. He has never used smokeless tobacco. He reports that he drinks alcohol. He reports that he does not use drugs. family history includes Bladder Cancer in his mother; Hypertension in his father; Prostate cancer in his father. Allergies  Allergen Reactions  . Gluten Meal Nausea And Vomiting      Outpatient Encounter Medications as of 11/28/2017  Medication Sig  . cyclobenzaprine (FLEXERIL) 5 MG tablet Take 1 tablet (5 mg total) by mouth at bedtime. (Patient taking differently: Take 5 mg by mouth as needed. )  . fexofenadine (ALLEGRA) 30 MG tablet Take 30 mg  by mouth daily as needed.   . fluticasone (FLONASE) 50 MCG/ACT nasal spray Place 1 spray into both nostrils as needed.  . gabapentin (NEURONTIN) 600 MG tablet Take 1 tablet (600 mg total) by mouth 3 (three) times daily. (Patient taking differently: Take 600 mg by mouth 3 (three) times daily as needed. )  . hydrOXYzine (VISTARIL) 25 MG capsule Take 25 mg by mouth 3 (three) times daily as needed.  Marland Kitchen LORazepam (ATIVAN) 0.5 MG tablet Take 1 tablet 30 minutes before the test .  . meloxicam (MOBIC) 15 MG tablet Take 1 tablet (15 mg total) by mouth daily.  . metoCLOPramide (REGLAN) 5 MG tablet Take 5 mg by mouth as needed.  Marland Kitchen olmesartan (BENICAR) 20 MG tablet Take 20 mg by mouth daily.  Marland Kitchen omeprazole (PRILOSEC) 40 MG capsule Take 40 mg by mouth daily.  Marland Kitchen amphetamine-dextroamphetamine (ADDERALL XR) 20 MG 24 hr capsule Take 1 capsule (20 mg total) by mouth daily. (Patient not taking: Reported on 11/28/2017)  . lisinopril (PRINIVIL,ZESTRIL) 20 MG tablet Take 20 mg by mouth daily.   No facility-administered encounter medications on file as of 11/28/2017.      REVIEW OF SYSTEMS  : All other systems reviewed and negative except where noted in the History of Present Illness.   PHYSICAL EXAM: BP 118/74   Pulse 68   Ht 5\' 6"  (1.676 m)  Wt 187 lb (84.8 kg)   BMI 30.18 kg/m   General: Well developed white male in no acute distress Head: Normocephalic and atraumatic Eyes:  Sclerae anicteric, conjunctiva pink. Ears: Normal auditory acuity Lungs: Clear throughout to auscultation; no increased WOB. Heart: Regular rate and rhythm; no M/R/G. Abdomen: Soft, non-distended.  BS present.  Mild RUQ and epigastric TTP. Musculoskeletal: Symmetrical with no gross deformities  Skin: No lesions on visible extremities Extremities: No edema  Neurological: Alert oriented x 4, grossly non-focal Psychological:  Alert and cooperative. Normal mood and affect  ASSESSMENT AND PLAN: *RUQ abdominal pain:  Occurs  intermittently after fatty foods.  Previously had negative ultrasound, CT scan, MRCP.  ? Biliary dysfunction.  Will check a HIDA scan with CCK. *GERD:  Long-standing symptoms despite daily PPI.  Epigastric pain and TTP.  Will schedule for EGD with Dr. Myrtie Neitheranis.   **The risks, benefits, and alternatives to EGD were discussed with the patient and he consents to proceed.    CC:  Abigail MiyamotoPerry, Lawrence Edward,*

## 2017-11-28 NOTE — Patient Instructions (Signed)
If you are age 52 or older, your body mass index should be between 23-30. Your Body mass index is 30.18 kg/m. If this is out of the aforementioned range listed, please consider follow up with your Primary Care Provider.  If you are age 100 or younger, your body mass index should be between 19-25. Your Body mass index is 30.18 kg/m. If this is out of the aformentioned range listed, please consider follow up with your Primary Care Provider.   You have been scheduled for an endoscopy. Please follow written instructions given to you at your visit today. If you use inhalers (even only as needed), please bring them with you on the day of your procedure. Your physician has requested that you go to www.startemmi.com and enter the access code given to you at your visit today. This web site gives a general overview about your procedure. However, you should still follow specific instructions given to you by our office regarding your preparation for the procedure.  You have been scheduled for a HIDA scan at [facility] (1st floor) on 12/14/17. Please arrive 15 minutes prior to your scheduled appointment at 6:96 am. Make certain not to have anything to eat or drink at least 6 hours prior to your test. Should this appointment date or time not work well for you, please call radiology scheduling at [phone].  _____________________________________________________________________ hepatobiliary (HIDA) scan is an imaging procedure used to diagnose problems in the liver, gallbladder and bile ducts. In the HIDA scan, a radioactive chemical or tracer is injected into a vein in your arm. The tracer is handled by the liver like bile. Bile is a fluid produced and excreted by your liver that helps your digestive system break down fats in the foods you eat. Bile is stored in your gallbladder and the gallbladder releases the bile when you eat a meal. A special nuclear medicine scanner (gamma camera) tracks the flow of the  tracer from your liver into your gallbladder and small intestine.  During your HIDA scan  You'll be asked to change into a hospital gown before your HIDA scan begins. Your health care team will position you on a table, usually on your back. The radioactive tracer is then injected into a vein in your arm.The tracer travels through your bloodstream to your liver, where it's taken up by the bile-producing cells. The radioactive tracer travels with the bile from your liver into your gallbladder and through your bile ducts to your small intestine.You may feel some pressure while the radioactive tracer is injected into your vein. As you lie on the table, a special gamma camera is positioned over your abdomen taking pictures of the tracer as it moves through your body. The gamma camera takes pictures continually for about an hour. You'll need to keep still during the HIDA scan. This can become uncomfortable, but you may find that you can lessen the discomfort by taking deep breaths and thinking about other things. Tell your health care team if you're uncomfortable. The radiologist will watch on a computer the progress of the radioactive tracer through your body. The HIDA scan may be stopped when the radioactive tracer is seen in the gallbladder and enters your small intestine. This typically takes about an hour. In some cases extra imaging will be performed if original images aren't satisfactory, if morphine is given to help visualize the gallbladder or if the medication CCK is given to look at the contraction of the gallbladder. This test typically takes 2  hours to complete. ________________________________________________________________________ Thank you for choosing me and [facility].   Levy Pupa, PA-C

## 2017-11-28 NOTE — Progress Notes (Signed)
Thank you for sending this case to me. I have reviewed the entire note, and the outlined plan seems appropriate.  I also reviewed the office note from Sept 2018 again.  Wilfrid Lund, MD

## 2017-12-11 ENCOUNTER — Encounter: Payer: Self-pay | Admitting: Gastroenterology

## 2017-12-14 ENCOUNTER — Encounter (HOSPITAL_COMMUNITY)
Admission: RE | Admit: 2017-12-14 | Discharge: 2017-12-14 | Disposition: A | Discharge status: Home / Self Care | Payer: BC Managed Care – PPO | Source: Ambulatory Visit | Attending: Gastroenterology | Admitting: Gastroenterology

## 2017-12-14 DIAGNOSIS — K219 Gastro-esophageal reflux disease without esophagitis: Secondary | ICD-10-CM | POA: Diagnosis present

## 2017-12-14 DIAGNOSIS — R1013 Epigastric pain: Secondary | ICD-10-CM | POA: Insufficient documentation

## 2017-12-14 DIAGNOSIS — R1011 Right upper quadrant pain: Secondary | ICD-10-CM | POA: Diagnosis present

## 2017-12-14 MED ORDER — TECHNETIUM TC 99M MEBROFENIN IV KIT
5.5000 | PACK | Freq: Once | INTRAVENOUS | Status: AC | PRN
Start: 2017-12-14 — End: 2017-12-14
  Administered 2017-12-14: 5.5 via INTRAVENOUS

## 2017-12-26 ENCOUNTER — Ambulatory Visit (AMBULATORY_SURGERY_CENTER): Payer: BC Managed Care – PPO | Admitting: Gastroenterology

## 2017-12-26 ENCOUNTER — Encounter: Payer: Self-pay | Admitting: Gastroenterology

## 2017-12-26 ENCOUNTER — Telehealth: Payer: Self-pay | Admitting: Gastroenterology

## 2017-12-26 ENCOUNTER — Other Ambulatory Visit: Payer: Self-pay

## 2017-12-26 VITALS — BP 108/72 | HR 64 | Temp 98.4°F | Resp 9 | Ht 66.0 in | Wt 187.0 lb

## 2017-12-26 DIAGNOSIS — R1011 Right upper quadrant pain: Secondary | ICD-10-CM | POA: Diagnosis not present

## 2017-12-26 DIAGNOSIS — K3189 Other diseases of stomach and duodenum: Secondary | ICD-10-CM

## 2017-12-26 DIAGNOSIS — K2950 Unspecified chronic gastritis without bleeding: Secondary | ICD-10-CM | POA: Diagnosis not present

## 2017-12-26 MED ORDER — SODIUM CHLORIDE 0.9 % IV SOLN: 500.0000 mL | Freq: Once | INTRAVENOUS | Status: DC

## 2017-12-26 NOTE — Patient Instructions (Signed)
YOU HAD AN ENDOSCOPIC PROCEDURE TODAY: Refer to the procedure report and other information in the discharge instructions given to you for any specific questions about what was found during the examination. If this information does not answer your questions, please call [place] office at [phone] to clarify.   YOU SHOULD EXPECT: Some feelings of bloating in the abdomen. Passage of more gas than usual. Walking can help get rid of the air that was put into your GI tract during the procedure and reduce the bloating. If you had a lower endoscopy (such as a colonoscopy or flexible sigmoidoscopy) you may notice spotting of blood in your stool or on the toilet paper. Some abdominal soreness may be present for a day or two, also.  DIET: Your first meal following the procedure should be a light meal and then it is ok to progress to your normal diet. A half-sandwich or bowl of soup is an example of a good first meal. Heavy or fried foods are harder to digest and may make you feel nauseous or bloated. Drink plenty of fluids but you should avoid alcoholic beverages for 24 hours. If you had a esophageal dilation, please see attached instructions for diet.    ACTIVITY: Your care partner should take you home directly after the procedure. You should plan to take it easy, moving slowly for the rest of the day. You can resume normal activity the day after the procedure however YOU SHOULD NOT DRIVE, use power tools, machinery or perform tasks that involve climbing or major physical exertion for 24 hours (because of the sedation medicines used during the test).   SYMPTOMS TO REPORT IMMEDIATELY: A gastroenterologist can be reached at any hour. Please call [phone]  for any of the following symptoms:   Following upper endoscopy (EGD, EUS, ERCP, esophageal dilation) Vomiting of blood or coffee ground material  New, significant abdominal pain  New, significant chest pain or pain under the shoulder blades  Painful or  persistently difficult swallowing  New shortness of breath  Black, tarry-looking or red, bloody stools  FOLLOW UP:  If any biopsies were taken you will be contacted by phone or by letter within the next 1-3 weeks. Call [phone]  if you have not heard about the biopsies in 3 weeks.  Please also call with any specific questions about appointments or follow up tests.  

## 2017-12-26 NOTE — Op Note (Signed)
[place] Patient Name: Gary Weaver Procedure Date: 12/26/2017 10:48 AM MRN: [id number] Endoscopist: Mallie Mussel L. Loletha Carrow , MD Age: 52 Referring MD:  Date of Birth: 03/23/1966 Gender: Male Account #: [id number] Procedure:                Upper GI endoscopy Indications:              Epigastric abdominal pain, Abdominal pain in the                            right upper quadrant (nomal GB ultrasuond and                            [facility]-HIDA). Reported history celiac. On gluten-free                            diet. Had EGD with SB biopsies 03/2008 Medicines:                Monitored Anesthesia Care Procedure:                Pre-Anesthesia Assessment:                           - Prior to the procedure, a History and Physical                            was performed, and patient medications and                            allergies were reviewed. The patient's tolerance of                            previous anesthesia was also reviewed. The risks                            and benefits of the procedure and the sedation                            options and risks were discussed with the patient.                            All questions were answered, and informed consent                            was obtained. Prior Anticoagulants: The patient has                            taken no previous anticoagulant or antiplatelet                            agents. ASA Grade Assessment: II - A patient with                            mild systemic disease. After reviewing the risks  and benefits, the patient was deemed in                            satisfactory condition to undergo the procedure.                           After obtaining informed consent, the endoscope was                            passed under direct vision. Throughout the                            procedure, the patient's blood pressure, pulse, and                            oxygen saturations were  monitored continuously. The                            Endoscope was introduced through the mouth, and                            advanced to the second part of duodenum. The upper                            GI endoscopy was accomplished without difficulty.                            The patient tolerated the procedure well. Scope In: Scope Out: Findings:                 The larynx was normal.                           The esophagus was normal.                           Striped mildly erythematous mucosa was found in the                            gastric antrum. Biopsies were taken with a cold                            forceps for histology. (Sidney protocol).                           The cardia and gastric fundus were normal on                            retroflexion.                           The examined duodenum was normal. Specifically,                            there was no mucosal scalloping or edema. Complications:  No immediate complications. Estimated Blood Loss:     Estimated blood loss was minimal. Impression:               - Normal larynx.                           - Normal esophagus.                           - Erythematous mucosa in the antrum. Biopsied.                           - Normal examined duodenum. Recommendation:           - Patient has a contact number available for                            emergencies. The signs and symptoms of potential                            delayed complications were discussed with the                            patient. Return to normal activities tomorrow.                            Written discharge instructions were provided to the                            patient.                           - Resume previous diet.                           - Continue present medications.                           - Await pathology results. [name] L. Loletha Carrow, MD 12/26/2017 11:16:09 AM This report has been signed electronically.

## 2017-12-26 NOTE — Progress Notes (Signed)
To PACU, VSS. Report to RN.tb 

## 2017-12-26 NOTE — Progress Notes (Signed)
Called to room to assist during endoscopic procedure.  Patient ID and intended procedure confirmed with present staff. Received instructions for my participation in the procedure from the performing physician.  

## 2017-12-26 NOTE — Progress Notes (Signed)
No egg or soy allergy

## 2017-12-27 ENCOUNTER — Telehealth: Payer: Self-pay | Admitting: *Deleted

## 2017-12-27 NOTE — Telephone Encounter (Signed)
  Follow up Call-  Call back number 12/26/2017  Post procedure Call Back phone  # [phone]  Permission to leave phone message Yes  Some recent data might be hidden     Patient questions:  Do you have a fever, pain , or abdominal swelling? No. Pain Score  0 *  Have you tolerated food without any problems? Yes.    Have you been able to return to your normal activities? Yes.    Do you have any questions about your discharge instructions: Diet   No. Medications  No. Follow up visit  No.  Do you have questions or concerns about your Care? No.  Actions: * If pain score is 4 or above: No action needed, pain <4. Patient with"some burning in stomach area since yesterday." patient stating he is fine. Reviewed symptoms to report. Patient verbalized understanding.

## 2018-01-03 ENCOUNTER — Telehealth: Payer: Self-pay | Admitting: Gastroenterology

## 2018-01-03 NOTE — Telephone Encounter (Signed)
Routed to Dr. Danis. 

## 2018-01-03 NOTE — Telephone Encounter (Signed)
Patient scheduled for follow up appointment at next available on 5/29 with JZ. Patient was also wondering about his HIDA scan results, I did read him the impression but did not interpret results, let him know that Shanda Bumps would review them at his appointment next week.

## 2018-01-03 NOTE — Telephone Encounter (Signed)
I have just reviewed the stomach biopsy report, and it is normal.  No stomach bacteria.  Please make him a follow up appointment with JZ next available.

## 2018-01-09 ENCOUNTER — Encounter (INDEPENDENT_AMBULATORY_CARE_PROVIDER_SITE_OTHER): Payer: Self-pay

## 2018-01-09 ENCOUNTER — Ambulatory Visit: Payer: BC Managed Care – PPO | Admitting: Gastroenterology

## 2018-01-09 ENCOUNTER — Encounter: Payer: Self-pay | Admitting: Gastroenterology

## 2018-01-09 VITALS — BP 116/80 | HR 84 | Ht 66.0 in | Wt 186.0 lb

## 2018-01-09 DIAGNOSIS — R1011 Right upper quadrant pain: Secondary | ICD-10-CM | POA: Diagnosis not present

## 2018-01-09 NOTE — Progress Notes (Signed)
01/09/2018 Gary Weaver [id number] 01/10/1966   HISTORY OF PRESENT ILLNESS:  This is a 52 year old male who was last seen here last month.  Had episode of acute pancreatitis of unclear source in 03/2017.  Continues to complain of intermittent RUQ abdominal pain especially after eating fatty foods.  Just had EGD with Dr. Myrtie Neither and was found to have mildly erythematous gastric mucosa, biopsies showed mild chronic gastritis-negative for Hpylori.  HIDA with CCK was normal with EF of 89%, but he had pain with ingestion of Ensure.  He returns today to discuss results.  Says that he is eating very low fat, but still has RUQ discomfort, mostly if he "cheats" and eats something containing higher fat.  He is due for screening colonoscopy in 03/2018 but would like to hold off on scheduling for now, will call back in a couple of months.   Past Medical History:  Diagnosis Date  . Arrhythmia   . GERD (gastroesophageal reflux disease)   . Gluten intolerance   . Headache   . Hearing loss   . Hyperlipidemia   . Hypertension   . Memory loss   . Pancreatitis   . Vision abnormalities    Past Surgical History:  Procedure Laterality Date  . COLONOSCOPY    . UPPER GASTROINTESTINAL ENDOSCOPY    . WISDOM TOOTH EXTRACTION      reports that he has never smoked. He has quit using smokeless tobacco. His smokeless tobacco use included chew. He reports that he drank alcohol. He reports that he does not use drugs. family history includes Bladder Cancer in his mother; Colon cancer in his maternal grandmother; Colon polyps in his father; Hypertension in his father; Prostate cancer in his father. Allergies  Allergen Reactions  . Gluten Meal Nausea And Vomiting      Outpatient Encounter Medications as of 01/09/2018  Medication Sig  . cyclobenzaprine (FLEXERIL) 5 MG tablet Take 1 tablet (5 mg total) by mouth at bedtime. (Patient taking differently: Take 5 mg by mouth as needed. )  . fexofenadine (ALLEGRA) 30  MG tablet Take 30 mg by mouth daily as needed.   . fluticasone (FLONASE) 50 MCG/ACT nasal spray Place 1 spray into both nostrils as needed.  . gabapentin (NEURONTIN) 600 MG tablet Take 1 tablet (600 mg total) by mouth 3 (three) times daily. (Patient taking differently: Take 600 mg by mouth 3 (three) times daily as needed. )  . hydrOXYzine (VISTARIL) 25 MG capsule Take 25 mg by mouth 3 (three) times daily as needed.  . meloxicam (MOBIC) 15 MG tablet Take 1 tablet (15 mg total) by mouth daily.  . metoCLOPramide (REGLAN) 5 MG tablet Take 5 mg by mouth as needed.  Marland Kitchen olmesartan (BENICAR) 20 MG tablet Take 10 mg by mouth daily.   Marland Kitchen omeprazole (PRILOSEC) 40 MG capsule Take 40 mg by mouth daily.  . [DISCONTINUED] amphetamine-dextroamphetamine (ADDERALL XR) 20 MG 24 hr capsule Take 1 capsule (20 mg total) by mouth daily. (Patient not taking: Reported on 11/28/2017)   Facility-Administered Encounter Medications as of 01/09/2018  Medication  . 0.9 %  sodium chloride infusion     REVIEW OF SYSTEMS  : All other systems reviewed and negative except where noted in the History of Present Illness.   PHYSICAL EXAM: BP 116/80   Pulse 84   Ht  (1.676 m)   Wt 186 lb (84.4 kg)   BMI 30.02 kg/m  General: Well developed white male in no acute  distress Head: Normocephalic and atraumatic Eyes:  Sclerae anicteric, conjunctiva pink. Ears: Normal auditory acuity Lungs: Clear throughout to auscultation; no increased WOB. Heart: Regular rate and rhythm; no M/R/G. Abdomen: Soft, non-distended.  BS present.  Non-tender. Musculoskeletal: Symmetrical with no gross deformities  Skin: No lesions on visible extremities Extremities: No edema  Neurological: Alert oriented x 4, grossly non-focal Psychological:  Alert and cooperative. Normal mood and affect  ASSESSMENT AND PLAN: *RUQ abdominal pain:  Occurs intermittently after fatty foods.  Previously had negative ultrasound, CT scan, MRCP.  Recent EGD ok and HIDA  with CCK normal although had pain with Ensure ingestion, EF high at 89%.  He would like to go back to eating a "regular" diet.  If he continues to have pain then will call back for referral to surgery for possible cholecystectomy as I have no other cause for his pain at this time.   *Pancreatitis:  Had an episode of acute pancreatitis in 03/2017 that was unexplained.   *Screening colonoscopy:  Due 03/2018.  He will call back in a couple of months to schedule.  **25 minutes spent with patient, at least 50% of which was spent discussing management and treatment options.   CC:  Abigail Miyamoto,*

## 2018-01-09 NOTE — Patient Instructions (Signed)
Call back if you have ongoing symptoms and want to be referred to surgery.  Call back when you are ready to schedule a colonoscopy

## 2018-01-10 NOTE — Progress Notes (Signed)
Thank you for sending this case to me. I have reviewed the entire note, and the outlined plan seems appropriate.  He has had a thorough workup.  Given the ongoing symptoms and prior episode of reported pancreatitis, cholecystectomy may need to be considered.  Wilfrid Lund, MD

## 2018-05-06 ENCOUNTER — Telehealth: Payer: Self-pay | Admitting: Gastroenterology

## 2018-05-06 NOTE — Telephone Encounter (Signed)
Pt needs referral to Dr. Johney Maine, the surgeon that will remove his gallbladder. Pt stated that Janett Billow knows about this.

## 2018-05-06 NOTE — Telephone Encounter (Signed)
Ok to make referral

## 2018-05-06 NOTE — Telephone Encounter (Signed)
Gary PebblesJessica FYI the pt wants to be referred to [facility] Dr Michaell CowingGross, I will make referral.

## 2018-05-16 ENCOUNTER — Telehealth: Payer: Self-pay | Admitting: Gastroenterology

## 2018-05-16 NOTE — Telephone Encounter (Signed)
The referral was made on 9/23.  The pt was advised.  I called [facility] to inquire on date of appt and left a message.  The pt was also given [facility] phone number to check on appt as well.

## 2018-05-16 NOTE — Telephone Encounter (Signed)
Patient wanting to check status of referral to Dr.Gross at [facility]. Patient requesting a call to verify referral was sent.

## 2018-05-21 NOTE — Telephone Encounter (Signed)
Pt is scheduled with Dr. Johney Maine on 05-27-18

## 2018-05-27 ENCOUNTER — Ambulatory Visit: Payer: Self-pay | Admitting: Surgery

## 2018-05-27 NOTE — H&P (Signed)
Gary Weaver Documented: 05/27/2018 9:51 AM Location: [facility] Patient #: [id number] DOB: 09-30-1965 Married / Language: Cleophus Molt / Race: White Male  History of Present Illness Gary Hector MD; 05/27/2018 1:45 PM) The patient is a 52 year old male who presents for evaluation of gall stones. Note for "Gall stones": ` ` ` Patient sent for surgical consultation at the request of Alonza Bogus, PA-C  Chief Complaint: Right upper quadrant pain. Question of need for cholecystectomy ` ` The patient is a pleasant gentleman. I took out his father's gallbladder earlier this year. Patient notes he's been struggling for the past 8 years with some intermittent abdominal pains. Usually starts in the epigastric region but more commonly its in the right upper quadrant. Related to eating. Usually heavier greasier foods. He has not been much of an issue until he had a severe attack last year. Went to the emergency room. Ultrasound was negative. Total bilirubin was 1.7. CT scan noted some questionable inflammation of the pancreatic head, suspicious for pancreatitis. Lipase and amylase normal low. Equivocal findings. Had repeated symptoms. So gastroenterology. He has known heartburn and reflux. Usually omeprazole makes manageable. Some occasional Reglan needed as well. He describes at more of a burning sensation. Usually related to spicy foods. The right upper quadrant pain is different. He denies any dysphagia to solids and liquids. Occasionally belches. He does not recall having a hiatal hernia. He had a fair amount is worked up and [facility]. Has had care at the [facility] gastroenterology as well. HIDA scan was done during the workup. It showed 89% gallbladder ejection fraction. However he had reproduction of symptoms when he drank the Ensure. Felt like another attack. I think they've been tried to adjust antacid medications and further workup at that been all  negative. If she saw cardiology last year when he had some epigastric discomfort. Negative stress test. Negative cardiac evaluation. Because of persistent symptoms reconsideration of gallbladder etiology discussed. Because had operated on his father, I was requested.  Patient usually moves his bowels once or twice a day. He does have celiac disease for which he is had a gluten-free diet for several decades. That is not radically change. He tried low fat diet which seemed to lower the intensity and frequency attacks. However this year despite that piece getting more frequent attacks and they are getting more intense. He rarely drinks any alcohol. Denies any liver problems no history of hepatitis. No hematochezia or melena. No diarrhea. No history of GI problems. His father did have a possible gallbladder polyp that seemed more like a stone that I took out her earlier this year. He comes today with his son. He feels great.  (Review of systems as stated in this history (HPI) or in the review of systems. Otherwise all other 12 point ROS are negative) ` ` `   Past Surgical History Sabino Gasser, Cecil; 05/27/2018 9:51 AM) No pertinent past surgical history  Diagnostic Studies History Sabino Gasser, CMA; 05/27/2018 9:51 AM) Colonoscopy 1-5 years ago  Allergies Sabino Gasser, CMA; 05/27/2018 9:52 AM) No Known Drug Allergies [05/27/2018]: Allergies Reconciled  Medication History Sabino Gasser, CMA; 05/27/2018 9:54 AM) Valsartan (160MG Tablet, Oral) Active. Omeprazole (40MG Capsule DR, Oral) Active. hydrOXYzine HCl (25MG Tablet, Oral) Active. Gabapentin (300MG Capsule, Oral) Active. Cyclobenzaprine HCl (5MG Tablet, Oral) Active. Medications Reconciled  Social History Sabino Gasser, CMA; 05/27/2018 9:51 AM) Alcohol use Occasional alcohol use. Caffeine use Coffee, Tea. No drug use Tobacco use Never smoker.  Family  History Sabino Gasser, CMA; 05/27/2018 9:51  AM) Arthritis Father. Depression Daughter, Mother. Hypertension Father. Prostate Cancer Father. Respiratory Condition Father.  Other Problems Sabino Gasser, CMA; 05/27/2018 9:51 AM) Arthritis Asthma Back Pain Gastric Ulcer Gastroesophageal Reflux Disease Heart murmur High blood pressure Pancreatitis     Review of Systems Sabino Gasser CMA; 05/27/2018 9:51 AM) General Present- Fatigue and Weight Gain. Not Present- Appetite Loss, Chills, Fever, Night Sweats and Weight Loss. Skin Not Present- Change in Wart/Mole, Dryness, Hives, Jaundice, New Lesions, Non-Healing Wounds, Rash and Ulcer. HEENT Present- Seasonal Allergies and Wears glasses/contact lenses. Not Present- Earache, Hearing Loss, Hoarseness, Nose Bleed, Oral Ulcers, Ringing in the Ears, Sinus Pain, Sore Throat, Visual Disturbances and Yellow Eyes. Respiratory Not Present- Bloody sputum, Chronic Cough, Difficulty Breathing, Snoring and Wheezing. Breast Not Present- Breast Mass, Breast Pain, Nipple Discharge and Skin Changes. Cardiovascular Not Present- Chest Pain, Difficulty Breathing Lying Down, Leg Cramps, Palpitations, Rapid Heart Rate, Shortness of Breath and Swelling of Extremities. Gastrointestinal Present- Abdominal Pain and Indigestion. Not Present- Bloating, Bloody Stool, Change in Bowel Habits, Chronic diarrhea, Constipation, Difficulty Swallowing, Excessive gas, Gets full quickly at meals, Hemorrhoids, Nausea, Rectal Pain and Vomiting. Male Genitourinary Not Present- Blood in Urine, Change in Urinary Stream, Frequency, Impotence, Nocturia, Painful Urination, Urgency and Urine Leakage. Musculoskeletal Present- Back Pain, Joint Pain and Joint Stiffness. Not Present- Muscle Pain, Muscle Weakness and Swelling of Extremities. Neurological Present- Decreased Memory and Numbness. Not Present- Fainting, Headaches, Seizures, Tingling, Tremor, Trouble walking and Weakness. Psychiatric Not Present- Anxiety, Bipolar,  Change in Sleep Pattern, Depression, Fearful and Frequent crying. Endocrine Present- Heat Intolerance. Not Present- Cold Intolerance, Excessive Hunger, Hair Changes, Hot flashes and New Diabetes. Hematology Not Present- Blood Thinners, Easy Bruising, Excessive bleeding, Gland problems, HIV and Persistent Infections.  Vitals Sabino Gasser CMA; 05/27/2018 9:54 AM) 05/27/2018 9:54 AM Weight: 198.13 lb Height: 66in Body Surface Area: 1.99 m Body Mass Index: 31.98 kg/m  Temp.: 98.77F(Oral)  Pulse: 87 (Regular)  BP: 130/84 (Sitting, Left Arm, Standard)      Physical Exam Gary Hector MD; 05/27/2018 1:46 PM)  General Mental Status-Alert. General Appearance-Not in acute distress, Not Sickly. Orientation-Oriented X3. Hydration-Well hydrated. Voice-Normal.  Integumentary Global Assessment Upon inspection and palpation of skin surfaces of the - Axillae: non-tender, no inflammation or ulceration, no drainage. and Distribution of scalp and body hair is normal. General Characteristics Temperature - normal warmth is noted.  Head and Neck Head-normocephalic, atraumatic with no lesions or palpable masses. Face Global Assessment - atraumatic, no absence of expression. Neck Global Assessment - no abnormal movements, no bruit auscultated on the right, no bruit auscultated on the left, no decreased range of motion, non-tender. Trachea-midline. Thyroid Gland Characteristics - non-tender.  Eye Eyeball - Left-Extraocular movements intact, No Nystagmus. Eyeball - Right-Extraocular movements intact, No Nystagmus. Cornea - Left-No Hazy. Cornea - Right-No Hazy. Sclera/Conjunctiva - Left-No scleral icterus, No Discharge. Sclera/Conjunctiva - Right-No scleral icterus, No Discharge. Pupil - Left-Direct reaction to light normal. Pupil - Right-Direct reaction to light normal.  ENMT Ears Pinna - Left - no drainage observed, no generalized tenderness  observed. Right - no drainage observed, no generalized tenderness observed. Nose and Sinuses External Inspection of the Nose - no destructive lesion observed. Inspection of the nares - Left - quiet respiration. Right - quiet respiration. Mouth and Throat Lips - Upper Lip - no fissures observed, no pallor noted. Lower Lip - no fissures observed, no pallor noted. Nasopharynx - no discharge present. Oral Cavity/Oropharynx - Tongue -  no dryness observed. Oral Mucosa - no cyanosis observed. Hypopharynx - no evidence of airway distress observed.  Chest and Lung Exam Inspection Movements - Normal and Symmetrical. Accessory muscles - No use of accessory muscles in breathing. Palpation Palpation of the chest reveals - Non-tender. Auscultation Breath sounds - Normal and Clear.  Cardiovascular Auscultation Rhythm - Regular. Murmurs & Other Heart Sounds - Auscultation of the heart reveals - No Murmurs and No Systolic Clicks.  Abdomen Inspection Inspection of the abdomen reveals - No Visible peristalsis and No Abnormal pulsations. Umbilicus - No Bleeding, No Urine drainage. Palpation/Percussion Palpation and Percussion of the abdomen reveal - Soft, Non Tender, No Rebound tenderness, No Rigidity (guarding) and No Cutaneous hyperesthesia. Note: Abdomen soft. Mild discomfort in right upper quadrant near anterior axillary line. Very sensitive at his xiphoid process in the epigastric region but no true hernia there. That seemed to be where his epigastric discomfort truly is. Left upper abdomen and rest of abdomen is nontender. Not distended. No umbilical or incisional hernias. No guarding.  Male Genitourinary Sexual Maturity Tanner 5 - Adult hair pattern and Adult penile size and shape.  Peripheral Vascular Upper Extremity Inspection - Left - No Cyanotic nailbeds, Not Ischemic. Right - No Cyanotic nailbeds, Not Ischemic.  Neurologic Neurologic evaluation reveals -normal attention span and  ability to concentrate, able to name objects and repeat phrases. Appropriate fund of knowledge , normal sensation and normal coordination. Mental Status Affect - not angry, not paranoid. Cranial Nerves-Normal Bilaterally. Gait-Normal.  Neuropsychiatric Mental status exam performed with findings of-able to articulate well with normal speech/language, rate, volume and coherence, thought content normal with ability to perform basic computations and apply abstract reasoning and no evidence of hallucinations, delusions, obsessions or homicidal/suicidal ideation.  Musculoskeletal Global Assessment Spine, Ribs and Pelvis - no instability, subluxation or laxity. Right Upper Extremity - no instability, subluxation or laxity.  Lymphatic Head & Neck  General Head & Neck Lymphatics: Bilateral - Description - No Localized lymphadenopathy. Axillary  General Axillary Region: Bilateral - Description - No Localized lymphadenopathy. Femoral & Inguinal  Generalized Femoral & Inguinal Lymphatics: Left - Description - No Localized lymphadenopathy. Right - Description - No Localized lymphadenopathy.    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 No definite proof of stones. However reproduction of symptoms with Ensure bolus during HIDA scan. Elevated bilirubin on worst attack last year in [place]. Question of pancreatic head inflammation without any chemical pancreatitis by blood work.  Because he's had persistent symptoms classic biliary colic and a negative cardiac and GI workup otherwise, I think he would benefit from cholecystectomy. He feels like his dad had similar symptoms that resolved with his cholecystectomy earlier this year. The patient is a reasonable candidate for a single site approach. I could do primary repair his umbilical hernia as well. I think his operative risks are  rather low. The biggest risk is lap chole does not break the cycle of attacks & something else is the cause. I wil consider liver biopsy as well, although no strong evidence of any liver disease by imaging or laboratory work.  Because he has persistent biliary colic symptoms and classic location, he wishes to proceed with surgery. We will work to coordinate convenient time.  Current Plans You are being scheduled for surgery- Our schedulers will call you.  You should hear from our office's scheduling department within 5 working days about the  location, date, and time of surgery. We try to make accommodations for patient's preferences in scheduling surgery, but sometimes the OR schedule or the surgeon's schedule prevents [place] from making those accommodations.  If you have not heard from our office [phone]) in 5 working days, call the office and ask for your surgeon's nurse.  If you have other questions about your diagnosis, plan, or surgery, call the office and ask for your surgeon's nurse.  Written instructions provided Pt Education - Pamphlet Given - Laparoscopic Gallbladder Surgery: discussed with patient and provided information. The anatomy & physiology of hepatobiliary & pancreatic function was discussed. The pathophysiology of gallbladder dysfunction was discussed. Natural history risks without surgery was discussed. I feel the risks of no intervention will lead to serious problems that outweigh the operative risks; therefore, I recommended cholecystectomy to remove the pathology. I explained laparoscopic techniques with possible need for an open approach. Probable cholangiogram to evaluate the bilary tract was explained as well.  Risks such as bleeding, infection, abscess, leak, injury to other organs, need for further treatment, heart attack, death, and other risks were discussed. I noted a good likelihood this will help address the problem. Possibility that this will not  correct all abdominal symptoms was explained. Goals of post-operative recovery were discussed as well. We will work to minimize complications. An educational handout further explaining the pathology and treatment options was given as well. Questions were answered. The patient expresses understanding & wishes to proceed with surgery.  Pt Education - [facility] Laparosopic Post Op HCI (Demiyah Fischbach) Pt Education - [facility] Good Bowel Health (Prakash Kimberling) Pt Education - Laparoscopic Cholecystectomy: gallbladder  UMBILICAL HERNIA WITHOUT OBSTRUCTION AND WITHOUT GANGRENE (K42.9) Impression: Small hernia at bellybutton. Can be primarily repaired at the same time through the cholecystectomy single site incision  Current Plans The anatomy & physiology of the abdominal wall was discussed. The pathophysiology of hernias was discussed. Natural history risks without surgery including progeressive enlargement, pain, incarceration, & strangulation was discussed. Contributors to complications such as smoking, obesity, diabetes, prior surgery, etc were discussed.  I feel the risks of no intervention will lead to serious problems that outweigh the operative risks; therefore, I recommended surgery to reduce and repair the hernia. I explained laparoscopic techniques with possible need for an open approach. I noted the probable use of mesh to patch and/or buttress the hernia repair  Risks such as bleeding, infection, abscess, need for further treatment, heart attack, death, and other risks were discussed. I noted a good likelihood this will help address the problem. Goals of post-operative recovery were discussed as well. Possibility that this will not correct all symptoms was explained. I stressed the importance of low-impact activity, aggressive pain control, avoiding constipation, & not pushing through pain to minimize risk of post-operative chronic pain or injury. Possibility of reherniation especially with smoking, obesity,  diabetes, immunosuppression, and other health conditions was discussed. We will work to minimize complications.  An educational handout further explaining the pathology & treatment options was given as well. Questions were answered. The patient expresses understanding & wishes to proceed with surgery.  Gary Hector, MD, FACS, MASCRS Gastrointestinal and Minimally Invasive Surgery    [address], [place], [place] [id number] [phone] Main / Paging [phone] Fax

## 2018-05-27 NOTE — H&P (View-Only) (Signed)
Jeanella Cara Documented: 05/27/2018 9:51 AM Location: [facility] Patient #: [id number] DOB: 05/19/1966 Married / Language: Cleophus Molt / Race: White Male  History of Present Illness Adin Hector MD; 05/27/2018 1:45 PM) The patient is a 52 year old male who presents for evaluation of gall stones. Note for "Gall stones": ` ` ` Patient sent for surgical consultation at the request of Alonza Bogus, PA-C  Chief Complaint: Right upper quadrant pain. Question of need for cholecystectomy ` ` The patient is a pleasant gentleman. I took out his father's gallbladder earlier this year. Patient notes he's been struggling for the past 8 years with some intermittent abdominal pains. Usually starts in the epigastric region but more commonly its in the right upper quadrant. Related to eating. Usually heavier greasier foods. He has not been much of an issue until he had a severe attack last year. Went to the emergency room. Ultrasound was negative. Total bilirubin was 1.7. CT scan noted some questionable inflammation of the pancreatic head, suspicious for pancreatitis. Lipase and amylase normal low. Equivocal findings. Had repeated symptoms. So gastroenterology. He has known heartburn and reflux. Usually omeprazole makes manageable. Some occasional Reglan needed as well. He describes at more of a burning sensation. Usually related to spicy foods. The right upper quadrant pain is different. He denies any dysphagia to solids and liquids. Occasionally belches. He does not recall having a hiatal hernia. He had a fair amount is worked up and [facility]. Has had care at the [facility] gastroenterology as well. HIDA scan was done during the workup. It showed 89% gallbladder ejection fraction. However he had reproduction of symptoms when he drank the Ensure. Felt like another attack. I think they've been tried to adjust antacid medications and further workup at that been all  negative. If she saw cardiology last year when he had some epigastric discomfort. Negative stress test. Negative cardiac evaluation. Because of persistent symptoms reconsideration of gallbladder etiology discussed. Because had operated on his father, I was requested.  Patient usually moves his bowels once or twice a day. He does have celiac disease for which he is had a gluten-free diet for several decades. That is not radically change. He tried low fat diet which seemed to lower the intensity and frequency attacks. However this year despite that piece getting more frequent attacks and they are getting more intense. He rarely drinks any alcohol. Denies any liver problems no history of hepatitis. No hematochezia or melena. No diarrhea. No history of GI problems. His father did have a possible gallbladder polyp that seemed more like a stone that I took out her earlier this year. He comes today with his son. He feels great.  (Review of systems as stated in this history (HPI) or in the review of systems. Otherwise all other 12 point ROS are negative) ` ` `   Past Surgical History Sabino Gasser, [place]; 05/27/2018 9:51 AM) No pertinent past surgical history  Diagnostic Studies History Sabino Gasser, CMA; 05/27/2018 9:51 AM) Colonoscopy 1-5 years ago  Allergies Sabino Gasser, CMA; 05/27/2018 9:52 AM) No Known Drug Allergies [05/27/2018]: Allergies Reconciled  Medication History Sabino Gasser, CMA; 05/27/2018 9:54 AM) Valsartan (160MG Tablet, Oral) Active. Omeprazole (40MG Capsule DR, Oral) Active. hydrOXYzine HCl (25MG Tablet, Oral) Active. Gabapentin (300MG Capsule, Oral) Active. Cyclobenzaprine HCl (5MG Tablet, Oral) Active. Medications Reconciled  Social History Sabino Gasser, CMA; 05/27/2018 9:51 AM) Alcohol use Occasional alcohol use. Caffeine use Coffee, Tea. No drug use Tobacco use Never smoker.  Family  History Sabino Gasser, CMA; 05/27/2018 9:51  AM) Arthritis Father. Depression Daughter, Mother. Hypertension Father. Prostate Cancer Father. Respiratory Condition Father.  Other Problems Sabino Gasser, CMA; 05/27/2018 9:51 AM) Arthritis Asthma Back Pain Gastric Ulcer Gastroesophageal Reflux Disease Heart murmur High blood pressure Pancreatitis     Review of Systems Sabino Gasser CMA; 05/27/2018 9:51 AM) General Present- Fatigue and Weight Gain. Not Present- Appetite Loss, Chills, Fever, Night Sweats and Weight Loss. Skin Not Present- Change in Wart/Mole, Dryness, Hives, Jaundice, New Lesions, Non-Healing Wounds, Rash and Ulcer. HEENT Present- Seasonal Allergies and Wears glasses/contact lenses. Not Present- Earache, Hearing Loss, Hoarseness, Nose Bleed, Oral Ulcers, Ringing in the Ears, Sinus Pain, Sore Throat, Visual Disturbances and Yellow Eyes. Respiratory Not Present- Bloody sputum, Chronic Cough, Difficulty Breathing, Snoring and Wheezing. Breast Not Present- Breast Mass, Breast Pain, Nipple Discharge and Skin Changes. Cardiovascular Not Present- Chest Pain, Difficulty Breathing Lying Down, Leg Cramps, Palpitations, Rapid Heart Rate, Shortness of Breath and Swelling of Extremities. Gastrointestinal Present- Abdominal Pain and Indigestion. Not Present- Bloating, Bloody Stool, Change in Bowel Habits, Chronic diarrhea, Constipation, Difficulty Swallowing, Excessive gas, Gets full quickly at meals, Hemorrhoids, Nausea, Rectal Pain and Vomiting. Male Genitourinary Not Present- Blood in Urine, Change in Urinary Stream, Frequency, Impotence, Nocturia, Painful Urination, Urgency and Urine Leakage. Musculoskeletal Present- Back Pain, Joint Pain and Joint Stiffness. Not Present- Muscle Pain, Muscle Weakness and Swelling of Extremities. Neurological Present- Decreased Memory and Numbness. Not Present- Fainting, Headaches, Seizures, Tingling, Tremor, Trouble walking and Weakness. Psychiatric Not Present- Anxiety, Bipolar,  Change in Sleep Pattern, Depression, Fearful and Frequent crying. Endocrine Present- Heat Intolerance. Not Present- Cold Intolerance, Excessive Hunger, Hair Changes, Hot flashes and New Diabetes. Hematology Not Present- Blood Thinners, Easy Bruising, Excessive bleeding, Gland problems, HIV and Persistent Infections.  Vitals Sabino Gasser CMA; 05/27/2018 9:54 AM) 05/27/2018 9:54 AM Weight: 198.13 lb Height: 66in Body Surface Area: 1.99 m Body Mass Index: 31.98 kg/m  Temp.: 98.58F(Oral)  Pulse: 87 (Regular)  BP: 130/84 (Sitting, Left Arm, Standard)      Physical Exam Adin Hector MD; 05/27/2018 1:46 PM)  General Mental Status-Alert. General Appearance-Not in acute distress, Not Sickly. Orientation-Oriented X3. Hydration-Well hydrated. Voice-Normal.  Integumentary Global Assessment Upon inspection and palpation of skin surfaces of the - Axillae: non-tender, no inflammation or ulceration, no drainage. and Distribution of scalp and body hair is normal. General Characteristics Temperature - normal warmth is noted.  Head and Neck Head-normocephalic, atraumatic with no lesions or palpable masses. Face Global Assessment - atraumatic, no absence of expression. Neck Global Assessment - no abnormal movements, no bruit auscultated on the right, no bruit auscultated on the left, no decreased range of motion, non-tender. Trachea-midline. Thyroid Gland Characteristics - non-tender.  Eye Eyeball - Left-Extraocular movements intact, No Nystagmus. Eyeball - Right-Extraocular movements intact, No Nystagmus. Cornea - Left-No Hazy. Cornea - Right-No Hazy. Sclera/Conjunctiva - Left-No scleral icterus, No Discharge. Sclera/Conjunctiva - Right-No scleral icterus, No Discharge. Pupil - Left-Direct reaction to light normal. Pupil - Right-Direct reaction to light normal.  ENMT Ears Pinna - Left - no drainage observed, no generalized tenderness  observed. Right - no drainage observed, no generalized tenderness observed. Nose and Sinuses External Inspection of the Nose - no destructive lesion observed. Inspection of the nares - Left - quiet respiration. Right - quiet respiration. Mouth and Throat Lips - Upper Lip - no fissures observed, no pallor noted. Lower Lip - no fissures observed, no pallor noted. Nasopharynx - no discharge present. Oral Cavity/Oropharynx - Tongue -  no dryness observed. Oral Mucosa - no cyanosis observed. Hypopharynx - no evidence of airway distress observed.  Chest and Lung Exam Inspection Movements - Normal and Symmetrical. Accessory muscles - No use of accessory muscles in breathing. Palpation Palpation of the chest reveals - Non-tender. Auscultation Breath sounds - Normal and Clear.  Cardiovascular Auscultation Rhythm - Regular. Murmurs & Other Heart Sounds - Auscultation of the heart reveals - No Murmurs and No Systolic Clicks.  Abdomen Inspection Inspection of the abdomen reveals - No Visible peristalsis and No Abnormal pulsations. Umbilicus - No Bleeding, No Urine drainage. Palpation/Percussion Palpation and Percussion of the abdomen reveal - Soft, Non Tender, No Rebound tenderness, No Rigidity (guarding) and No Cutaneous hyperesthesia. Note: Abdomen soft. Mild discomfort in right upper quadrant near anterior axillary line. Very sensitive at his xiphoid process in the epigastric region but no true hernia there. That seemed to be where his epigastric discomfort truly is. Left upper abdomen and rest of abdomen is nontender. Not distended. No umbilical or incisional hernias. No guarding.  Male Genitourinary Sexual Maturity Tanner 5 - Adult hair pattern and Adult penile size and shape.  Peripheral Vascular Upper Extremity Inspection - Left - No Cyanotic nailbeds, Not Ischemic. Right - No Cyanotic nailbeds, Not Ischemic.  Neurologic Neurologic evaluation reveals -normal attention span and  ability to concentrate, able to name objects and repeat phrases. Appropriate fund of knowledge , normal sensation and normal coordination. Mental Status Affect - not angry, not paranoid. Cranial Nerves-Normal Bilaterally. Gait-Normal.  Neuropsychiatric Mental status exam performed with findings of-able to articulate well with normal speech/language, rate, volume and coherence, thought content normal with ability to perform basic computations and apply abstract reasoning and no evidence of hallucinations, delusions, obsessions or homicidal/suicidal ideation.  Musculoskeletal Global Assessment Spine, Ribs and Pelvis - no instability, subluxation or laxity. Right Upper Extremity - no instability, subluxation or laxity.  Lymphatic Head & Neck  General Head & Neck Lymphatics: Bilateral - Description - No Localized lymphadenopathy. Axillary  General Axillary Region: Bilateral - Description - No Localized lymphadenopathy. Femoral & Inguinal  Generalized Femoral & Inguinal Lymphatics: Left - Description - No Localized lymphadenopathy. Right - Description - No Localized lymphadenopathy.    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 No definite proof of stones. However reproduction of symptoms with Ensure bolus during HIDA scan. Elevated bilirubin on worst attack last year in [place]. Question of pancreatic head inflammation without any chemical pancreatitis by blood work.  Because he's had persistent symptoms classic biliary colic and a negative cardiac and GI workup otherwise, I think he would benefit from cholecystectomy. He feels like his dad had similar symptoms that resolved with his cholecystectomy earlier this year. The patient is a reasonable candidate for a single site approach. I could do primary repair his umbilical hernia as well. I think his operative risks are  rather low. The biggest risk is lap chole does not break the cycle of attacks & something else is the cause. I wil consider liver biopsy as well, although no strong evidence of any liver disease by imaging or laboratory work.  Because he has persistent biliary colic symptoms and classic location, he wishes to proceed with surgery. We will work to coordinate convenient time.  Current Plans You are being scheduled for surgery- Our schedulers will call you.  You should hear from our office's scheduling department within 5 working days about the  location, date, and time of surgery. We try to make accommodations for patient's preferences in scheduling surgery, but sometimes the OR schedule or the surgeon's schedule prevents [place] from making those accommodations.  If you have not heard from our office [phone]) in 5 working days, call the office and ask for your surgeon's nurse.  If you have other questions about your diagnosis, plan, or surgery, call the office and ask for your surgeon's nurse.  Written instructions provided Pt Education - Pamphlet Given - Laparoscopic Gallbladder Surgery: discussed with patient and provided information. The anatomy & physiology of hepatobiliary & pancreatic function was discussed. The pathophysiology of gallbladder dysfunction was discussed. Natural history risks without surgery was discussed. I feel the risks of no intervention will lead to serious problems that outweigh the operative risks; therefore, I recommended cholecystectomy to remove the pathology. I explained laparoscopic techniques with possible need for an open approach. Probable cholangiogram to evaluate the bilary tract was explained as well.  Risks such as bleeding, infection, abscess, leak, injury to other organs, need for further treatment, heart attack, death, and other risks were discussed. I noted a good likelihood this will help address the problem. Possibility that this will not  correct all abdominal symptoms was explained. Goals of post-operative recovery were discussed as well. We will work to minimize complications. An educational handout further explaining the pathology and treatment options was given as well. Questions were answered. The patient expresses understanding & wishes to proceed with surgery.  Pt Education - [facility] Laparosopic Post Op HCI (Kizzie Cotten) Pt Education - [facility] Good Bowel Health (Shealyn Sean) Pt Education - Laparoscopic Cholecystectomy: gallbladder  UMBILICAL HERNIA WITHOUT OBSTRUCTION AND WITHOUT GANGRENE (K42.9) Impression: Small hernia at bellybutton. Can be primarily repaired at the same time through the cholecystectomy single site incision  Current Plans The anatomy & physiology of the abdominal wall was discussed. The pathophysiology of hernias was discussed. Natural history risks without surgery including progeressive enlargement, pain, incarceration, & strangulation was discussed. Contributors to complications such as smoking, obesity, diabetes, prior surgery, etc were discussed.  I feel the risks of no intervention will lead to serious problems that outweigh the operative risks; therefore, I recommended surgery to reduce and repair the hernia. I explained laparoscopic techniques with possible need for an open approach. I noted the probable use of mesh to patch and/or buttress the hernia repair  Risks such as bleeding, infection, abscess, need for further treatment, heart attack, death, and other risks were discussed. I noted a good likelihood this will help address the problem. Goals of post-operative recovery were discussed as well. Possibility that this will not correct all symptoms was explained. I stressed the importance of low-impact activity, aggressive pain control, avoiding constipation, & not pushing through pain to minimize risk of post-operative chronic pain or injury. Possibility of reherniation especially with smoking, obesity,  diabetes, immunosuppression, and other health conditions was discussed. We will work to minimize complications.  An educational handout further explaining the pathology & treatment options was given as well. Questions were answered. The patient expresses understanding & wishes to proceed with surgery.  Adin Hector, MD, FACS, MASCRS Gastrointestinal and Minimally Invasive Surgery    [address] [place], [place] [id number] [phone] Main / Paging [phone] Fax

## 2018-06-08 ENCOUNTER — Encounter (HOSPITAL_COMMUNITY): Payer: Self-pay | Admitting: Emergency Medicine

## 2018-06-08 ENCOUNTER — Emergency Department (HOSPITAL_COMMUNITY)
Admission: EM | Admit: 2018-06-08 | Discharge: 2018-06-09 | Disposition: A | Discharge status: Home / Self Care | Payer: BC Managed Care – PPO | Attending: Emergency Medicine | Admitting: Emergency Medicine

## 2018-06-08 ENCOUNTER — Emergency Department (HOSPITAL_COMMUNITY): Payer: BC Managed Care – PPO

## 2018-06-08 DIAGNOSIS — I10 Essential (primary) hypertension: Secondary | ICD-10-CM | POA: Diagnosis not present

## 2018-06-08 DIAGNOSIS — R109 Unspecified abdominal pain: Secondary | ICD-10-CM | POA: Insufficient documentation

## 2018-06-08 DIAGNOSIS — Z79899 Other long term (current) drug therapy: Secondary | ICD-10-CM | POA: Diagnosis not present

## 2018-06-08 DIAGNOSIS — Z87891 Personal history of nicotine dependence: Secondary | ICD-10-CM | POA: Insufficient documentation

## 2018-06-08 LAB — COMPREHENSIVE METABOLIC PANEL
ALT: 21 U/L (ref 0–44)
AST: 19 U/L (ref 15–41)
Albumin: 4.7 g/dL (ref 3.5–5.0)
Alkaline Phosphatase: 47 U/L (ref 38–126)
Anion gap: 10 (ref 5–15)
BUN: 13 mg/dL (ref 6–20)
CO2: 26 mmol/L (ref 22–32)
Calcium: 9.6 mg/dL (ref 8.9–10.3)
Chloride: 106 mmol/L (ref 98–111)
Creatinine, Ser: 1.19 mg/dL (ref 0.61–1.24)
GFR calc Af Amer: >60 mL/min (ref >60)
GFR calc non Af Amer: >60 mL/min (ref >60)
Glucose, Bld: 95 mg/dL (ref 70–99)
Potassium: 4.3 mmol/L (ref 3.5–5.1)
Sodium: 142 mmol/L (ref 135–145)
Total Bilirubin: 1.4 mg/dL — ABNORMAL HIGH (ref 0.3–1.2)
Total Protein: 7.5 g/dL (ref 6.5–8.1)

## 2018-06-08 LAB — URINALYSIS, ROUTINE W REFLEX MICROSCOPIC
Bacteria, UA: NONE SEEN
Bilirubin Urine: NEGATIVE
Glucose, UA: NEGATIVE mg/dL
Hgb urine dipstick: NEGATIVE
Ketones, ur: 5 mg/dL — AB
Leukocytes, UA: NEGATIVE
Nitrite: NEGATIVE
Protein, ur: NEGATIVE mg/dL
Specific Gravity, Urine: 1.026 (ref 1.005–1.030)
pH: 5 (ref 5.0–8.0)

## 2018-06-08 LAB — CBC
HCT: 42.9 % (ref 39.0–52.0)
Hemoglobin: 14.4 g/dL (ref 13.0–17.0)
MCH: 29.7 pg (ref 26.0–34.0)
MCHC: 33.6 g/dL (ref 30.0–36.0)
MCV: 88.5 fL (ref 80.0–100.0)
Platelets: 278 10*3/uL (ref 150–400)
RBC: 4.85 MIL/uL (ref 4.22–5.81)
RDW: 12 % (ref 11.5–15.5)
WBC: 6.6 10*3/uL (ref 4.0–10.5)
nRBC: 0 % (ref 0.0–0.2)

## 2018-06-08 LAB — LIPASE, BLOOD: Lipase: 33 U/L (ref 11–51)

## 2018-06-08 MED ORDER — SODIUM CHLORIDE 0.9 % IJ SOLN
INTRAMUSCULAR | Status: AC
  Filled 2018-06-08: qty 50

## 2018-06-08 MED ORDER — IOPAMIDOL (ISOVUE-300) INJECTION 61%
100.0000 mL | Freq: Once | INTRAVENOUS | Status: AC | PRN
  Administered 2018-06-08: 100 mL via INTRAVENOUS

## 2018-06-08 MED ORDER — ONDANSETRON HCL 4 MG/2ML IJ SOLN
4.0000 mg | Freq: Once | INTRAMUSCULAR | Status: AC
  Administered 2018-06-08: 4 mg via INTRAVENOUS
  Filled 2018-06-08: qty 2

## 2018-06-08 MED ORDER — IOPAMIDOL (ISOVUE-300) INJECTION 61%
INTRAVENOUS | Status: AC
  Filled 2018-06-08: qty 100

## 2018-06-08 MED ORDER — LACTATED RINGERS IV BOLUS
1000.0000 mL | Freq: Once | INTRAVENOUS | Status: AC
  Administered 2018-06-08: 1000 mL via INTRAVENOUS

## 2018-06-08 NOTE — ED Triage Notes (Signed)
Pt reports abd pains for over week with nausea. Has schedule for gallbladder surgery scheduled for week of thanksgiving. Was seen at urgent care in [facility] today and was told to come to ED. Reports abd pains are worse at night and in the mornings and after eating.

## 2018-06-08 NOTE — ED Provider Notes (Signed)
[facility] DEPT Provider Note   CSN: [id number] Arrival date & time: 06/08/18  1717     History   Chief Complaint Chief Complaint  Patient presents with  . Abdominal Pain  . Nausea    HPI Gary Weaver is a 52 y.o. male.  HPI   52 year old male with abdominal pain.  He has a history of intermittent abdominal pain dating back several years.  Oftentimes the pain is in the epigastric regions right upper quadrant and exacerbated by eating.  Recent evaluation by surgery for the same and is actually scheduled for a cholecystectomy in 1 month.  The past few days he has had increasing pain.  Stronger in intensity and I will so a little bit changed in character from his typical symptoms.  Pain is now more diffuse.  Nauseated.  Little appetite.  Past Medical History:  Diagnosis Date  . Arrhythmia   . GERD (gastroesophageal reflux disease)   . Gluten intolerance   . Headache   . Hearing loss   . Hyperlipidemia   . Hypertension   . Memory loss   . Pancreatitis   . Vision abnormalities     Patient Active Problem List   Diagnosis Date Noted  . RUQ abdominal pain 11/28/2017  . Gastroesophageal reflux disease 11/28/2017  . Chronic pancreatitis ([place]) 05/09/2017  . Hx of acute pancreatitis 05/09/2017  . Epigastric pain 05/09/2017  . Snoring 11/14/2016  . 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 Decreased attention Span 03/30/2015  . Neck pain 10/22/2014  . Lower back pain 10/22/2014  . Radiculopathy, lumbar region 10/22/2014  . Facial numbness 10/22/2014  . Dysesthesia 10/22/2014  . Multiple joint pain 10/22/2014  . Myalgia 10/22/2014  . Insomnia 10/22/2014  . Palpitations 03/04/2014  . Chest pain 02/09/2014  . HTN (hypertension) 02/09/2014    Past Surgical History:  Procedure Laterality Date  . COLONOSCOPY    . UPPER GASTROINTESTINAL ENDOSCOPY    . WISDOM TOOTH EXTRACTION          Home Medications    Prior to  Admission medications   Medication Sig Start Date End Date Taking? Authorizing Provider  acetaminophen (TYLENOL) 500 MG tablet Take 500 mg by mouth every 6 (six) hours as needed for moderate pain.   Yes [provider]  albuterol (PROVENTIL HFA;VENTOLIN HFA) 108 (90 Base) MCG/ACT inhaler Inhale 2 puffs into the lungs every 6 (six) hours as needed for wheezing or shortness of breath.   Yes [provider]  cholecalciferol (VITAMIN D) 1000 units tablet Take 4,000 Units by mouth daily.   Yes [provider]  cyclobenzaprine (FLEXERIL) 5 MG tablet Take 1 tablet (5 mg total) by mouth at bedtime. Patient taking differently: Take 5 mg by mouth 3 (three) times daily as needed for muscle spasms.  11/14/16  Yes Sater, Nanine Means, MD  fexofenadine (ALLEGRA) 180 MG tablet Take 180 mg by mouth daily.   Yes [provider]  fluticasone (FLONASE) 50 MCG/ACT nasal spray Place 1 spray into both nostrils daily as needed for allergies.    Yes [provider]  gabapentin (NEURONTIN) 300 MG capsule Take 300-2,400 mg by mouth 3 (three) times daily as needed (pain).   Yes [provider]  hydrOXYzine (VISTARIL) 25 MG capsule Take 25 mg by mouth daily.    Yes [provider]  ibuprofen (ADVIL,MOTRIN) 200 MG tablet Take 200 mg by mouth every 6 (six) hours as needed for moderate pain.  Yes [provider]  metoCLOPramide (REGLAN) 5 MG tablet Take 5 mg by mouth every 6 (six) hours as needed for nausea or vomiting.    Yes [provider]  nicotine (NICODERM CQ - DOSED IN MG/24 HR) 7 mg/24hr patch Place 7 mg onto the skin daily.   Yes [provider]  omeprazole (PRILOSEC) 40 MG capsule Take 40 mg by mouth daily.   Yes [provider]  oxymetazoline (AFRIN) 0.05 % nasal spray Place 1 spray into both nostrils 2 (two) times daily as needed for congestion.   Yes [provider]  valsartan (DIOVAN) 160 MG tablet Take 80 mg by  mouth daily.   Yes [provider]  fexofenadine (ALLEGRA) 30 MG tablet Take 30 mg by mouth daily as needed.     [provider]  gabapentin (NEURONTIN) 600 MG tablet Take 1 tablet (600 mg total) by mouth 3 (three) times daily. Patient not taking: Reported on 06/08/2018 11/14/16   Sater, Nanine Means, MD  meloxicam (MOBIC) 15 MG tablet Take 1 tablet (15 mg total) by mouth daily. Patient not taking: Reported on 06/08/2018 04/12/16   Britt Bottom, MD    Family History Family History  Problem Relation Age of Onset  . Hypertension Father   . Prostate cancer Father   . Colon polyps Father   . Bladder Cancer Mother   . Colon cancer Maternal Grandmother   . Esophageal cancer Neg Hx   . Rectal cancer Neg Hx   . Stomach cancer Neg Hx   . Pancreatic cancer Neg Hx     Social History Social History   Tobacco Use  . Smoking status: Never Smoker  . Smokeless tobacco: Former Systems developer    Types: Chew  Substance Use Topics  . Alcohol use: Not Currently  . Drug use: No     Allergies   Gluten meal   Review of Systems Review of Systems  All systems reviewed and negative, other than as noted in HPI.  Physical Exam Updated Vital Signs BP (!) 122/95 (BP Location: Left Arm)   Pulse 89   Temp 98.1 F (36.7 C) (Oral)   Resp 18   Ht 5' 6"  (1.676 m)   Wt 86.8 kg   SpO2 98%   BMI 30.90 kg/m   Physical Exam  Constitutional: He appears well-developed and well-nourished. No distress.  HENT:  Head: Normocephalic and atraumatic.  Eyes: Conjunctivae are normal. Right eye exhibits no discharge. Left eye exhibits no discharge.  Neck: Neck supple.  Cardiovascular: Normal rate, regular rhythm and normal heart sounds. Exam reveals no gallop and no friction rub.  No murmur heard. Pulmonary/Chest: Effort normal and breath sounds normal. No respiratory distress.  Abdominal: Soft. He exhibits no distension. There is tenderness.  Diffuse tenderness, somewhat worse in the lower  abdomen.  No rebound or guarding.  No distention.  Musculoskeletal: He exhibits no edema or tenderness.  Neurological: He is alert.  Skin: Skin is warm and dry.  Psychiatric: He has a normal mood and affect. His behavior is normal. Thought content normal.  Nursing note and vitals reviewed.    ED Treatments / Results  Labs (all labs ordered are listed, but only abnormal results are displayed) Labs Reviewed  COMPREHENSIVE METABOLIC PANEL - Abnormal; Notable for the following components:      Result Value   Total Bilirubin 1.4 (*)    All other components within normal limits  URINALYSIS, ROUTINE W REFLEX MICROSCOPIC - Abnormal; Notable for  the following components:   Ketones, ur 5 (*)    All other components within normal limits  LIPASE, BLOOD  CBC    EKG None  Radiology Ct Abdomen Pelvis W Contrast  Result Date: 06/09/2018 CLINICAL DATA:  52 year old male with abdominal pain and nausea. EXAM: CT ABDOMEN AND PELVIS WITH CONTRAST TECHNIQUE: Multidetector CT imaging of the abdomen and pelvis was performed using the standard protocol following bolus administration of intravenous contrast. CONTRAST:  163m ISOVUE-300 IOPAMIDOL (ISOVUE-300) INJECTION 61% COMPARISON:  CT of the abdomen pelvis dated 04/04/2017 and MRI dated 05/16/2017 FINDINGS: Lower chest: The visualized lung bases are clear. No intra-abdominal free air or free fluid. Hepatobiliary: Subcentimeter hepatic hypodense lesion is too small to characterize but likely a cyst. The liver is otherwise unremarkable. No intrahepatic biliary ductal dilatation. The gallbladder is unremarkable. Pancreas: Unremarkable. No pancreatic ductal dilatation or surrounding inflammatory changes. Spleen: Normal in size without focal abnormality. Adrenals/Urinary Tract: Adrenal glands are unremarkable. Kidneys are normal, without renal calculi, focal lesion, or hydronephrosis. Bladder is unremarkable. Stomach/Bowel: Stomach is within normal limits.  Appendix appears normal. No evidence of bowel wall thickening, distention, or inflammatory changes. Vascular/Lymphatic: No significant vascular findings are present. No enlarged abdominal or pelvic lymph nodes. Reproductive: The prostate and seminal vesicles are grossly unremarkable. Other: Small fat containing umbilical hernia. Musculoskeletal: No acute or significant osseous findings. IMPRESSION: No acute intra-abdominal or pelvic pathology. Electronically Signed   By: AAnner CreteM.D.   On: 06/09/2018 00:02    Procedures Procedures (including critical care time)  Medications Ordered in ED Medications  lactated ringers bolus 1,000 mL ( Intravenous Rate/Dose Verify 06/08/18 2306)  iopamidol (ISOVUE-300) 61 % injection 100 mL (has no administration in time range)  ondansetron (ZOFRAN) injection 4 mg (4 mg Intravenous Given 06/08/18 2304)     Initial Impression / Assessment and Plan / ED Course  I have reviewed the triage vital signs and the nursing notes.  Pertinent labs & imaging results that were available during my care of the patient were reviewed by me and considered in my medical decision making (see chart for details).     52year old male with abdominal pain.  Acute on chronic.  He says his pain is mostly upper abdomen but he is actually more tender in his lower abdomen on exam.  Work-up unremarkable.  His labs are essentially normal.  CT of the abdomen and pelvis w/o explanatory pathology.   Final Clinical Impressions(s) / ED Diagnoses   Final diagnoses:  Abdominal pain, unspecified abdominal location    ED Discharge Orders    None       KVirgel Manifold MD 06/10/18 [phone]

## 2018-06-09 MED ORDER — ONDANSETRON 4 MG PO TBDP: 4.0000 mg | ORAL_TABLET | Freq: Three times a day (TID) | ORAL | 0 refills | Status: DC | PRN

## 2018-06-09 MED ORDER — TRAMADOL HCL 50 MG PO TABS: 50.0000 mg | ORAL_TABLET | Freq: Four times a day (QID) | ORAL | 0 refills | Status: DC | PRN

## 2018-06-09 MED ORDER — HYDROMORPHONE HCL 1 MG/ML IJ SOLN
0.7500 mg | Freq: Once | INTRAMUSCULAR | Status: DC
  Filled 2018-06-09: qty 1

## 2018-06-11 ENCOUNTER — Other Ambulatory Visit: Payer: Self-pay

## 2018-06-11 ENCOUNTER — Encounter (HOSPITAL_COMMUNITY): Payer: Self-pay | Admitting: *Deleted

## 2018-06-12 ENCOUNTER — Encounter: Payer: Self-pay | Admitting: Gastroenterology

## 2018-06-12 ENCOUNTER — Ambulatory Visit: Payer: BC Managed Care – PPO | Admitting: Gastroenterology

## 2018-06-12 VITALS — BP 106/70 | HR 76 | Ht 66.0 in | Wt 193.0 lb

## 2018-06-12 DIAGNOSIS — R1011 Right upper quadrant pain: Secondary | ICD-10-CM | POA: Diagnosis not present

## 2018-06-12 DIAGNOSIS — R0789 Other chest pain: Secondary | ICD-10-CM | POA: Diagnosis not present

## 2018-06-12 DIAGNOSIS — R12 Heartburn: Secondary | ICD-10-CM | POA: Diagnosis not present

## 2018-06-12 MED ORDER — BUPIVACAINE LIPOSOME 1.3 % IJ SUSP
20.0000 mL | Freq: Once | INTRAMUSCULAR | Status: DC
  Filled 2018-06-12: qty 20

## 2018-06-12 NOTE — Progress Notes (Signed)
Reviewed hibiclens shower instructions with patient.  Patient came by and picked up hibiclens soap x 2 with printed hibiclens shower instructions.

## 2018-06-12 NOTE — Patient Instructions (Signed)
If you are age 52 or older, your body mass index should be between 23-30. Your Body mass index is 31.15 kg/m. If this is out of the aforementioned range listed, please consider follow up with your Primary Care Provider.  If you are age 34 or younger, your body mass index should be between 19-25. Your Body mass index is 31.15 kg/m. If this is out of the aformentioned range listed, please consider follow up with your Primary Care Provider.   Follow up as needed.   It was a pleasure to see you today!  Dr. Myrtie Neither

## 2018-06-12 NOTE — Progress Notes (Signed)
- Preparing for Surgery Before surgery, you can play an important role.  Because skin is not sterile, your skin needs to be as free of germs as possible.  You can reduce the number of germs on your skin by washing with CHG (chlorahexidine gluconate) soap before surgery.  CHG is an antiseptic cleaner which kills germs and bonds with the skin to continue killing germs even after washing. Please DO NOT use if you have an allergy to CHG or antibacterial soaps.  If your skin becomes reddened/irritated stop using the CHG and inform your nurse when you arrive at Short Stay. Do not shave (including legs and underarms) for at least 48 hours prior to the first CHG shower.  You may shave your face/neck. Please follow these instructions carefully:  1.  Shower with CHG Soap the night before surgery and the  morning of Surgery.  2.  If you choose to wash your hair, wash your hair first as usual with your  normal  shampoo.  3.  After you shampoo, rinse your hair and body thoroughly to remove the  shampoo.                           4.  Use CHG as you would any other liquid soap.  You can apply chg directly  to the skin and wash                       Gently with a scrungie or clean washcloth.  5.  Apply the CHG Soap to your body ONLY FROM THE NECK DOWN.   Do not use on face/ open                           Wound or open sores. Avoid contact with eyes, ears mouth and genitals (private parts).                       Wash face,  Genitals (private parts) with your normal soap.             6.  Wash thoroughly, paying special attention to the area where your surgery  will be performed.  7.  Thoroughly rinse your body with warm water from the neck down.  8.  DO NOT shower/wash with your normal soap after using and rinsing off  the CHG Soap.                9.  Pat yourself dry with a clean towel.            10.  Wear clean pajamas.            11.  Place clean sheets on your bed the night of your first shower and  do not  sleep with pets. Day of Surgery : Do not apply any lotions/deodorants the morning of surgery.  Please wear clean clothes to the hospital/surgery center.  FAILURE TO FOLLOW THESE INSTRUCTIONS MAY RESULT IN THE CANCELLATION OF YOUR SURGERY PATIENT SIGNATURE_________________________________  NURSE SIGNATURE__________________________________  ________________________________________________________________________  

## 2018-06-12 NOTE — Progress Notes (Signed)
[facility] Progress Note  Chief Complaint: Right upper quadrant pain  Subjective  History:  Gary Weaver had an episode of unexplained pancreatitis last year.  He then saw our clinic on a couple occasions afterwards for intermittent right upper quadrant pain with fatty food.  No stones in the gallbladder or bile duct on MRI/MRCP October 2018, and there was a subsequent normal [facility] HIDA scan.  He continued to have episodes and requested a surgical referral, which was made earlier this month.  He apparently saw Dr. gross is scheduled for cholecystectomy tomorrow.  Gary Weaver was in the emergency department 4 days ago with abdominal pain that had increased in intensity.  ED physician found to be more tender in the lower abdomen, CT scan was unremarkable. Upper endoscopy May 2019 normal except for some mild gastric antral erythema; biopsy was negative for H. pylori or intestinal metaplasia. He had a colonoscopy in August 2009 by Dr. Chales Weaver in [place], done for chronic diarrhea.  It was a complete exam to the terminal ileum, normal except for diverticulosis and internal hemorrhoids.   Marks symptoms have behaved much the same as before, with a constellation of intermittent right upper quadrant pain after meals, chronic heartburn (especially if he eats late in the evening), feelings of fullness and upper abdominal bloating.  He also lifted something heavy about a week ago and since then has had pain in his left upper quadrant/lower chest wall, and that is part of why he came to the ED several days ago.  He says that pain then set off all his other upper digestive symptoms.  He remains "mostly" on a gluten-free diet because he was given a diagnosis of celiac sprue many years ago, the details of which are uncertain.  He had no visible mucosal scalloping on his recent EGD.  ROS: Cardiovascular: Chest wall pain No dyspnea Dysuria  The patient's Past Medical, Family and Social History were reviewed and are on  file in the EMR.  Objective:  Med list reviewed  Current Outpatient Medications:  .  albuterol (PROVENTIL HFA;VENTOLIN HFA) 108 (90 Base) MCG/ACT inhaler, Inhale 2 puffs into the lungs every 6 (six) hours as needed for wheezing or shortness of breath., Disp: , Rfl:  .  cholecalciferol (VITAMIN D) 1000 units tablet, Take 4,000 Units by mouth daily., Disp: , Rfl:  .  cyclobenzaprine (FLEXERIL) 5 MG tablet, Take 1 tablet (5 mg total) by mouth at bedtime. (Patient taking differently: Take 5 mg by mouth 3 (three) times daily as needed for muscle spasms. ), Disp: 30 tablet, Rfl: 11 .  fexofenadine (ALLEGRA) 180 MG tablet, Take 180 mg by mouth daily., Disp: , Rfl:  .  gabapentin (NEURONTIN) 300 MG capsule, Take 300-2,400 mg by mouth 3 (three) times daily as needed (pain)., Disp: , Rfl:  .  gabapentin (NEURONTIN) 600 MG tablet, Take 1 tablet (600 mg total) by mouth 3 (three) times daily., Disp: 90 tablet, Rfl: 11 .  hydrOXYzine (VISTARIL) 25 MG capsule, Take 25 mg by mouth daily. , Disp: , Rfl:  .  metoCLOPramide (REGLAN) 5 MG tablet, Take 5 mg by mouth every 6 (six) hours as needed for nausea or vomiting. , Disp: , Rfl:  .  omeprazole (PRILOSEC) 40 MG capsule, Take 40 mg by mouth daily., Disp: , Rfl:  .  ondansetron (ZOFRAN ODT) 4 MG disintegrating tablet, Take 1 tablet (4 mg total) by mouth every 8 (eight) hours as needed for nausea or vomiting. (Patient taking differently: Take  4 mg by mouth at bedtime. ), Disp: 20 tablet, Rfl: 0 .  traMADol (ULTRAM) 50 MG tablet, Take 1 tablet (50 mg total) by mouth every 6 (six) hours as needed. (Patient taking differently: Take 50 mg by mouth at bedtime. ), Disp: 12 tablet, Rfl: 0 .  valsartan (DIOVAN) 160 MG tablet, Take 80 mg by mouth daily., Disp: , Rfl:   Current Facility-Administered Medications:  .  0.9 %  sodium chloride infusion, 500 mL, Intravenous, Once, Danis, Gary Blower, MD  Facility-Administered Medications Ordered in Other Visits:  .  [START ON  06/13/2018] bupivacaine liposome (EXPAREL) 1.3 % injection 266 mg, 20 mL, Infiltration, Once, Gross, Gary Spare, MD   Vital signs in last 24 hrs: Vitals:   06/12/18 0935  BP: 106/70  Pulse: 76    Physical Exam  He is well appearing, uncomfortable with chest wall pain.  HEENT: sclera anicteric, oral mucosa moist without lesions  Neck: supple, no thyromegaly, JVD or lymphadenopathy  Cardiac: RRR without murmurs, S1S2 heard, no peripheral edema  Pulm: clear to auscultation bilaterally, normal RR and effort noted.  Left anterior chest wall tenderness.  Xiphoid tenderness as before  Abdomen: soft, no tenderness, with active bowel sounds. No guarding or palpable hepatosplenomegaly.  Skin; warm and dry, no jaundice or rash  Recent Labs:  CBC Latest Ref Rng & Units 06/08/2018 05/07/2017  WBC 4.0 - 10.5 K/uL 6.6 5.3  Hemoglobin 13.0 - 17.0 g/dL 16.1 09.6  Hematocrit 04.5 - 52.0 % 42.9 42.1  Platelets 150 - 400 K/uL 278 219.0   CMP Latest Ref Rng & Units 06/08/2018 05/10/2017 05/07/2017  Glucose 70 - 99 mg/dL 95 - 409(W)  BUN 6 - 20 mg/dL 13 - 13  Creatinine 1.19 - 1.24 mg/dL 1.47 - 8.29  Sodium 562 - 145 mmol/L 142 - 143  Potassium 3.5 - 5.1 mmol/L 4.3 - 4.7  Chloride 98 - 111 mmol/L 106 - 106  CO2 22 - 32 mmol/L 26 - 30  Calcium 8.9 - 10.3 mg/dL 9.6 - 13.0  Total Protein 6.5 - 8.1 g/dL 7.5 6.7 -  Total Bilirubin 0.3 - 1.2 mg/dL 8.6(V) 1.1 -  Alkaline Phos 38 - 126 U/L 47 42 -  AST 15 - 41 U/L 19 13 -  ALT 0 - 44 U/L 21 15 -      Radiologic studies: CT abdomen and pelvis report 06/08/2018 on file, images personally reviewed.   @ASSESSMENTPLANBEGIN @ Assessment: Encounter Diagnoses  Name Primary?  . RUQ pain Yes  . Heartburn   . Chest wall pain    His symptom complex remains somewhat puzzling.  I believe he does have biliary colic, but I suspect there is a functional overlay to symptoms.  He currently has some musculoskeletal chest wall pain that may have flared up this  recent pain episode.  He has cholecystectomy planned for tomorrow.  I suspect this will improve his right upper quadrant postprandial pain, but he will most likely have persistent reflux and bloating.  This may lead to a prolonged symptomatic recovery after surgery.  He can follow-up with me afterwards.   Total time 20 minutes, over half spent face-to-face with patient in counseling and coordination of care.   Charlie Pitter III

## 2018-06-13 ENCOUNTER — Other Ambulatory Visit: Payer: Self-pay

## 2018-06-13 ENCOUNTER — Ambulatory Visit (HOSPITAL_COMMUNITY): Payer: BC Managed Care – PPO | Admitting: Certified Registered Nurse Anesthetist

## 2018-06-13 ENCOUNTER — Encounter (HOSPITAL_COMMUNITY): Payer: Self-pay | Admitting: Certified Registered Nurse Anesthetist

## 2018-06-13 ENCOUNTER — Ambulatory Visit (HOSPITAL_COMMUNITY): Payer: BC Managed Care – PPO

## 2018-06-13 ENCOUNTER — Encounter (HOSPITAL_COMMUNITY)
Admission: RE | Disposition: A | Discharge status: Home / Self Care | Payer: Self-pay | Source: Ambulatory Visit | Attending: Surgery

## 2018-06-13 ENCOUNTER — Ambulatory Visit (HOSPITAL_COMMUNITY)
Admission: RE | Admit: 2018-06-13 | Discharge: 2018-06-13 | Disposition: A | Discharge status: Home / Self Care | Payer: BC Managed Care – PPO | Source: Ambulatory Visit | Attending: Surgery | Admitting: Surgery

## 2018-06-13 DIAGNOSIS — M1990 Unspecified osteoarthritis, unspecified site: Secondary | ICD-10-CM | POA: Insufficient documentation

## 2018-06-13 DIAGNOSIS — Z7951 Long term (current) use of inhaled steroids: Secondary | ICD-10-CM | POA: Insufficient documentation

## 2018-06-13 DIAGNOSIS — K259 Gastric ulcer, unspecified as acute or chronic, without hemorrhage or perforation: Secondary | ICD-10-CM | POA: Insufficient documentation

## 2018-06-13 DIAGNOSIS — J45909 Unspecified asthma, uncomplicated: Secondary | ICD-10-CM | POA: Insufficient documentation

## 2018-06-13 DIAGNOSIS — K828 Other specified diseases of gallbladder: Secondary | ICD-10-CM | POA: Insufficient documentation

## 2018-06-13 DIAGNOSIS — R0683 Snoring: Secondary | ICD-10-CM | POA: Diagnosis not present

## 2018-06-13 DIAGNOSIS — Z8042 Family history of malignant neoplasm of prostate: Secondary | ICD-10-CM | POA: Insufficient documentation

## 2018-06-13 DIAGNOSIS — Z8261 Family history of arthritis: Secondary | ICD-10-CM | POA: Insufficient documentation

## 2018-06-13 DIAGNOSIS — R208 Other disturbances of skin sensation: Secondary | ICD-10-CM | POA: Diagnosis not present

## 2018-06-13 DIAGNOSIS — Z836 Family history of other diseases of the respiratory system: Secondary | ICD-10-CM | POA: Insufficient documentation

## 2018-06-13 DIAGNOSIS — M5441 Lumbago with sciatica, right side: Secondary | ICD-10-CM | POA: Diagnosis not present

## 2018-06-13 DIAGNOSIS — K8010 Calculus of gallbladder with chronic cholecystitis without obstruction: Secondary | ICD-10-CM | POA: Diagnosis present

## 2018-06-13 DIAGNOSIS — Z79899 Other long term (current) drug therapy: Secondary | ICD-10-CM | POA: Diagnosis not present

## 2018-06-13 DIAGNOSIS — Z8052 Family history of malignant neoplasm of bladder: Secondary | ICD-10-CM | POA: Insufficient documentation

## 2018-06-13 DIAGNOSIS — Z419 Encounter for procedure for purposes other than remedying health state, unspecified: Secondary | ICD-10-CM

## 2018-06-13 DIAGNOSIS — Z8249 Family history of ischemic heart disease and other diseases of the circulatory system: Secondary | ICD-10-CM | POA: Diagnosis not present

## 2018-06-13 DIAGNOSIS — R011 Cardiac murmur, unspecified: Secondary | ICD-10-CM | POA: Insufficient documentation

## 2018-06-13 DIAGNOSIS — F419 Anxiety disorder, unspecified: Secondary | ICD-10-CM | POA: Insufficient documentation

## 2018-06-13 DIAGNOSIS — G4700 Insomnia, unspecified: Secondary | ICD-10-CM | POA: Insufficient documentation

## 2018-06-13 DIAGNOSIS — I10 Essential (primary) hypertension: Secondary | ICD-10-CM | POA: Insufficient documentation

## 2018-06-13 DIAGNOSIS — R002 Palpitations: Secondary | ICD-10-CM | POA: Insufficient documentation

## 2018-06-13 DIAGNOSIS — M791 Myalgia, unspecified site: Secondary | ICD-10-CM | POA: Insufficient documentation

## 2018-06-13 DIAGNOSIS — Z87891 Personal history of nicotine dependence: Secondary | ICD-10-CM | POA: Insufficient documentation

## 2018-06-13 DIAGNOSIS — K9041 Non-celiac gluten sensitivity: Secondary | ICD-10-CM | POA: Insufficient documentation

## 2018-06-13 DIAGNOSIS — F519 Sleep disorder not due to a substance or known physiological condition, unspecified: Secondary | ICD-10-CM | POA: Diagnosis not present

## 2018-06-13 DIAGNOSIS — R200 Anesthesia of skin: Secondary | ICD-10-CM | POA: Insufficient documentation

## 2018-06-13 DIAGNOSIS — K812 Acute cholecystitis with chronic cholecystitis: Secondary | ICD-10-CM

## 2018-06-13 DIAGNOSIS — M542 Cervicalgia: Secondary | ICD-10-CM | POA: Diagnosis not present

## 2018-06-13 DIAGNOSIS — E785 Hyperlipidemia, unspecified: Secondary | ICD-10-CM | POA: Insufficient documentation

## 2018-06-13 DIAGNOSIS — K219 Gastro-esophageal reflux disease without esophagitis: Secondary | ICD-10-CM | POA: Insufficient documentation

## 2018-06-13 DIAGNOSIS — K429 Umbilical hernia without obstruction or gangrene: Secondary | ICD-10-CM | POA: Insufficient documentation

## 2018-06-13 DIAGNOSIS — K861 Other chronic pancreatitis: Secondary | ICD-10-CM | POA: Insufficient documentation

## 2018-06-13 DIAGNOSIS — Z8371 Family history of colonic polyps: Secondary | ICD-10-CM | POA: Insufficient documentation

## 2018-06-13 DIAGNOSIS — Z818 Family history of other mental and behavioral disorders: Secondary | ICD-10-CM | POA: Insufficient documentation

## 2018-06-13 DIAGNOSIS — F329 Major depressive disorder, single episode, unspecified: Secondary | ICD-10-CM | POA: Insufficient documentation

## 2018-06-13 DIAGNOSIS — Z800 Family history of malignant neoplasm of digestive organs: Secondary | ICD-10-CM | POA: Insufficient documentation

## 2018-06-13 DIAGNOSIS — M5442 Lumbago with sciatica, left side: Secondary | ICD-10-CM | POA: Diagnosis not present

## 2018-06-13 DIAGNOSIS — K859 Acute pancreatitis without necrosis or infection, unspecified: Secondary | ICD-10-CM | POA: Diagnosis not present

## 2018-06-13 DIAGNOSIS — K8012 Calculus of gallbladder with acute and chronic cholecystitis without obstruction: Secondary | ICD-10-CM | POA: Insufficient documentation

## 2018-06-13 HISTORY — DX: Unspecified asthma, uncomplicated: J45.909

## 2018-06-13 HISTORY — DX: Unspecified osteoarthritis, unspecified site: M19.90

## 2018-06-13 HISTORY — DX: Anxiety disorder, unspecified: F41.9

## 2018-06-13 HISTORY — PX: LAPAROSCOPIC CHOLECYSTECTOMY SINGLE SITE WITH INTRAOPERATIVE CHOLANGIOGRAM: SHX6538

## 2018-06-13 HISTORY — PX: UMBILICAL HERNIA REPAIR: SHX196

## 2018-06-13 HISTORY — DX: Pneumonia, unspecified organism: J18.9

## 2018-06-13 SURGERY — LAPAROSCOPIC CHOLECYSTECTOMY SINGLE SITE WITH INTRAOPERATIVE CHOLANGIOGRAM
Anesthesia: General

## 2018-06-13 MED ORDER — HYDROMORPHONE HCL 1 MG/ML IJ SOLN
INTRAMUSCULAR | Status: AC
  Filled 2018-06-13: qty 1

## 2018-06-13 MED ORDER — DEXAMETHASONE SODIUM PHOSPHATE 4 MG/ML IJ SOLN
INTRAMUSCULAR | Status: DC | PRN
  Administered 2018-06-13: 10 mg via INTRAVENOUS

## 2018-06-13 MED ORDER — EPINEPHRINE PF 1 MG/ML IJ SOLN
INTRAMUSCULAR | Status: AC
  Filled 2018-06-13: qty 1

## 2018-06-13 MED ORDER — SUGAMMADEX SODIUM 200 MG/2ML IV SOLN
INTRAVENOUS | Status: DC | PRN
  Administered 2018-06-13: 300 mg via INTRAVENOUS

## 2018-06-13 MED ORDER — CHLORHEXIDINE GLUCONATE CLOTH 2 % EX PADS: 6.0000 | MEDICATED_PAD | Freq: Once | CUTANEOUS | Status: DC

## 2018-06-13 MED ORDER — ONDANSETRON HCL 4 MG/2ML IJ SOLN
INTRAMUSCULAR | Status: DC | PRN
  Administered 2018-06-13: 4 mg via INTRAVENOUS

## 2018-06-13 MED ORDER — HYDROMORPHONE HCL 1 MG/ML IJ SOLN
0.2500 mg | INTRAMUSCULAR | Status: DC | PRN
  Administered 2018-06-13 (×2): 0.5 mg via INTRAVENOUS

## 2018-06-13 MED ORDER — METRONIDAZOLE IN NACL 5-0.79 MG/ML-% IV SOLN
500.0000 mg | INTRAVENOUS | Status: AC
  Administered 2018-06-13: 500 mg via INTRAVENOUS
  Filled 2018-06-13: qty 100

## 2018-06-13 MED ORDER — PROMETHAZINE HCL 25 MG/ML IJ SOLN: 6.2500 mg | INTRAMUSCULAR | Status: DC | PRN

## 2018-06-13 MED ORDER — LIDOCAINE 2% (20 MG/ML) 5 ML SYRINGE
INTRAMUSCULAR | Status: DC | PRN
  Administered 2018-06-13: 60 mg via INTRAVENOUS

## 2018-06-13 MED ORDER — MIDAZOLAM HCL 2 MG/2ML IJ SOLN
INTRAMUSCULAR | Status: AC
  Filled 2018-06-13: qty 2

## 2018-06-13 MED ORDER — BUPIVACAINE-EPINEPHRINE 0.25% -1:200000 IJ SOLN: INTRAMUSCULAR | Status: DC | PRN

## 2018-06-13 MED ORDER — LACTATED RINGERS IV SOLN
INTRAVENOUS | Status: DC
  Administered 2018-06-13 (×2): via INTRAVENOUS

## 2018-06-13 MED ORDER — TRAMADOL HCL 50 MG PO TABS: 50.0000 mg | ORAL_TABLET | Freq: Four times a day (QID) | ORAL | 0 refills | Status: DC | PRN

## 2018-06-13 MED ORDER — ONDANSETRON HCL 4 MG/2ML IJ SOLN
INTRAMUSCULAR | Status: AC
  Filled 2018-06-13: qty 2

## 2018-06-13 MED ORDER — IOPAMIDOL (ISOVUE-300) INJECTION 61%
INTRAVENOUS | Status: AC
  Filled 2018-06-13: qty 50

## 2018-06-13 MED ORDER — EPINEPHRINE PF 1 MG/ML IJ SOLN
INTRAMUSCULAR | Status: DC | PRN
  Administered 2018-06-13: .3 mL

## 2018-06-13 MED ORDER — BUPIVACAINE HCL (PF) 0.25 % IJ SOLN
INTRAMUSCULAR | Status: AC
  Filled 2018-06-13: qty 60

## 2018-06-13 MED ORDER — BUPIVACAINE LIPOSOME 1.3 % IJ SUSP
INTRAMUSCULAR | Status: DC | PRN
  Administered 2018-06-13: 20 mL

## 2018-06-13 MED ORDER — STERILE WATER FOR IRRIGATION IR SOLN
Status: DC | PRN
  Administered 2018-06-13: 1000 mL

## 2018-06-13 MED ORDER — LACTATED RINGERS IV SOLN
INTRAVENOUS | Status: AC | PRN
  Administered 2018-06-13: 3000 mL

## 2018-06-13 MED ORDER — ROCURONIUM BROMIDE 10 MG/ML (PF) SYRINGE
PREFILLED_SYRINGE | INTRAVENOUS | Status: DC | PRN
  Administered 2018-06-13: 60 mg via INTRAVENOUS

## 2018-06-13 MED ORDER — FENTANYL CITRATE (PF) 250 MCG/5ML IJ SOLN
INTRAMUSCULAR | Status: AC
  Filled 2018-06-13: qty 5

## 2018-06-13 MED ORDER — GABAPENTIN 300 MG PO CAPS
300.0000 mg | ORAL_CAPSULE | ORAL | Status: AC
  Administered 2018-06-13: 300 mg via ORAL
  Filled 2018-06-13: qty 1

## 2018-06-13 MED ORDER — SUGAMMADEX SODIUM 200 MG/2ML IV SOLN
INTRAVENOUS | Status: AC
  Filled 2018-06-13: qty 4

## 2018-06-13 MED ORDER — PROPOFOL 10 MG/ML IV BOLUS
INTRAVENOUS | Status: AC
  Filled 2018-06-13: qty 20

## 2018-06-13 MED ORDER — ROCURONIUM BROMIDE 10 MG/ML (PF) SYRINGE
PREFILLED_SYRINGE | INTRAVENOUS | Status: AC
  Filled 2018-06-13: qty 10

## 2018-06-13 MED ORDER — PHENYLEPHRINE 40 MCG/ML (10ML) SYRINGE FOR IV PUSH (FOR BLOOD PRESSURE SUPPORT)
PREFILLED_SYRINGE | INTRAVENOUS | Status: DC | PRN
  Administered 2018-06-13: 80 ug via INTRAVENOUS

## 2018-06-13 MED ORDER — KETOROLAC TROMETHAMINE 30 MG/ML IJ SOLN
30.0000 mg | Freq: Once | INTRAMUSCULAR | Status: AC | PRN
  Administered 2018-06-13: 30 mg via INTRAVENOUS

## 2018-06-13 MED ORDER — DEXAMETHASONE SODIUM PHOSPHATE 10 MG/ML IJ SOLN
INTRAMUSCULAR | Status: AC
  Filled 2018-06-13: qty 1

## 2018-06-13 MED ORDER — MIDAZOLAM HCL 5 MG/5ML IJ SOLN
INTRAMUSCULAR | Status: DC | PRN
  Administered 2018-06-13: 2 mg via INTRAVENOUS

## 2018-06-13 MED ORDER — KETOROLAC TROMETHAMINE 30 MG/ML IJ SOLN
INTRAMUSCULAR | Status: AC
  Filled 2018-06-13: qty 1

## 2018-06-13 MED ORDER — PROPOFOL 10 MG/ML IV BOLUS
INTRAVENOUS | Status: DC | PRN
  Administered 2018-06-13: 160 mg via INTRAVENOUS

## 2018-06-13 MED ORDER — SODIUM CHLORIDE 0.9 % IR SOLN
Status: DC | PRN
  Administered 2018-06-13: 7.5 mL

## 2018-06-13 MED ORDER — 0.9 % SODIUM CHLORIDE (POUR BTL) OPTIME
TOPICAL | Status: DC | PRN
  Administered 2018-06-13: 1000 mL

## 2018-06-13 MED ORDER — BUPIVACAINE HCL (PF) 0.25 % IJ SOLN
INTRAMUSCULAR | Status: DC | PRN
  Administered 2018-06-13: 60 mL

## 2018-06-13 MED ORDER — CELECOXIB 200 MG PO CAPS
200.0000 mg | ORAL_CAPSULE | ORAL | Status: AC
  Administered 2018-06-13: 200 mg via ORAL
  Filled 2018-06-13: qty 1

## 2018-06-13 MED ORDER — LIDOCAINE 2% (20 MG/ML) 5 ML SYRINGE
INTRAMUSCULAR | Status: AC
  Filled 2018-06-13: qty 5

## 2018-06-13 MED ORDER — IOPAMIDOL (ISOVUE-300) INJECTION 61%
INTRAVENOUS | Status: DC | PRN
  Administered 2018-06-13: 7.5 mL

## 2018-06-13 MED ORDER — CEFAZOLIN SODIUM-DEXTROSE 2-4 GM/100ML-% IV SOLN
2.0000 g | INTRAVENOUS | Status: AC
  Administered 2018-06-13: 2 g via INTRAVENOUS
  Filled 2018-06-13: qty 100

## 2018-06-13 MED ORDER — FENTANYL CITRATE (PF) 100 MCG/2ML IJ SOLN
INTRAMUSCULAR | Status: DC | PRN
  Administered 2018-06-13 (×2): 50 ug via INTRAVENOUS
  Administered 2018-06-13: 100 ug via INTRAVENOUS
  Administered 2018-06-13: 50 ug via INTRAVENOUS

## 2018-06-13 MED ORDER — ACETAMINOPHEN 500 MG PO TABS
1000.0000 mg | ORAL_TABLET | ORAL | Status: AC
  Administered 2018-06-13: 1000 mg via ORAL
  Filled 2018-06-13: qty 2

## 2018-06-13 SURGICAL SUPPLY — 56 items
APPLIER CLIP 5 13 M/L LIGAMAX5 (MISCELLANEOUS) ×3
BINDER ABDOMINAL 12 ML 46-62 (SOFTGOODS) IMPLANT
CABLE HIGH FREQUENCY MONO STRZ (ELECTRODE) ×3 IMPLANT
CHLORAPREP W/TINT 26ML (MISCELLANEOUS) ×3 IMPLANT
CLIP APPLIE 5 13 M/L LIGAMAX5 (MISCELLANEOUS) ×1 IMPLANT
CLOSURE WOUND 1/2 X4 (GAUZE/BANDAGES/DRESSINGS) ×2
CONT SPEC 4OZ CLIKSEAL STRL BL (MISCELLANEOUS) ×3 IMPLANT
COVER MAYO STAND STRL (DRAPES) ×3 IMPLANT
COVER SURGICAL LIGHT HANDLE (MISCELLANEOUS) ×3 IMPLANT
COVER WAND RF STERILE (DRAPES) ×3 IMPLANT
DECANTER SPIKE VIAL GLASS SM (MISCELLANEOUS) ×3 IMPLANT
DEVICE SECURE STRAP 25 ABSORB (INSTRUMENTS) IMPLANT
DEVICE TROCAR PUNCTURE CLOSURE (ENDOMECHANICALS) ×3 IMPLANT
DRAIN CHANNEL 19F RND (DRAIN) IMPLANT
DRAPE C-ARM 42X120 X-RAY (DRAPES) ×3 IMPLANT
DRAPE WARM FLUID 44X44 (DRAPE) ×3 IMPLANT
DRSG TEGADERM 2-3/8X2-3/4 SM (GAUZE/BANDAGES/DRESSINGS) ×9 IMPLANT
DRSG TEGADERM 4X4.75 (GAUZE/BANDAGES/DRESSINGS) ×3 IMPLANT
ELECT PENCIL ROCKER SW 15FT (MISCELLANEOUS) ×3 IMPLANT
ELECT REM PT RETURN 15FT ADLT (MISCELLANEOUS) ×3 IMPLANT
ENDOLOOP SUT PDS II  0 18 (SUTURE) ×2
ENDOLOOP SUT PDS II 0 18 (SUTURE) ×1 IMPLANT
EVACUATOR SILICONE 100CC (DRAIN) IMPLANT
FILTER STRAW (MISCELLANEOUS) ×3 IMPLANT
GAUZE SPONGE 2X2 8PLY STRL LF (GAUZE/BANDAGES/DRESSINGS) ×1 IMPLANT
GLOVE ECLIPSE 8.0 STRL XLNG CF (GLOVE) ×3 IMPLANT
GLOVE INDICATOR 8.0 STRL GRN (GLOVE) ×3 IMPLANT
GOWN STRL REUS W/TWL XL LVL3 (GOWN DISPOSABLE) ×6 IMPLANT
IRRIG SUCT STRYKERFLOW 2 WTIP (MISCELLANEOUS) ×3
IRRIGATION SUCT STRKRFLW 2 WTP (MISCELLANEOUS) ×1 IMPLANT
KIT BASIN OR (CUSTOM PROCEDURE TRAY) ×3 IMPLANT
MARKER SKIN DUAL TIP RULER LAB (MISCELLANEOUS) ×3 IMPLANT
NEEDLE SPNL 22GX3.5 QUINCKE BK (NEEDLE) IMPLANT
PAD POSITIONING PINK XL (MISCELLANEOUS) ×3 IMPLANT
POSITIONER SURGICAL ARM (MISCELLANEOUS) ×3 IMPLANT
POUCH RETRIEVAL ECOSAC 10 (ENDOMECHANICALS) ×1 IMPLANT
POUCH RETRIEVAL ECOSAC 10MM (ENDOMECHANICALS) ×2
SCISSORS LAP 5X35 DISP (ENDOMECHANICALS) ×3 IMPLANT
SET CHOLANGIOGRAPH MIX (MISCELLANEOUS) ×3 IMPLANT
SHEARS HARMONIC ACE PLUS 36CM (ENDOMECHANICALS) ×3 IMPLANT
SLEEVE ADV FIXATION 5X100MM (TROCAR) ×3 IMPLANT
SPONGE GAUZE 2X2 STER 10/PKG (GAUZE/BANDAGES/DRESSINGS) ×2
STRIP CLOSURE SKIN 1/2X4 (GAUZE/BANDAGES/DRESSINGS) ×4 IMPLANT
SUT MNCRL AB 4-0 PS2 18 (SUTURE) ×3 IMPLANT
SUT PDS AB 1 CT1 27 (SUTURE) ×12 IMPLANT
SUT PROLENE 1 CT 1 30 (SUTURE) IMPLANT
SYR 20CC LL (SYRINGE) ×3 IMPLANT
SYR 27GX1/2 1ML LL SAFETY (SYRINGE) ×3 IMPLANT
TOWEL OR 17X26 10 PK STRL BLUE (TOWEL DISPOSABLE) ×3 IMPLANT
TOWEL OR NON WOVEN STRL DISP B (DISPOSABLE) ×3 IMPLANT
TRAY LAPAROSCOPIC (CUSTOM PROCEDURE TRAY) ×3 IMPLANT
TROCAR ADV FIXATION 11X100MM (TROCAR) IMPLANT
TROCAR ADV FIXATION 5X100MM (TROCAR) ×3 IMPLANT
TROCAR BLADELESS OPT 5 100 (ENDOMECHANICALS) ×3 IMPLANT
TROCAR BLADELESS OPT 5 150 (ENDOMECHANICALS) ×3 IMPLANT
TUBING INSUF HEATED (TUBING) ×3 IMPLANT

## 2018-06-13 NOTE — Transfer of Care (Signed)
Immediate Anesthesia Transfer of Care Note  Patient: Gary Weaver  Procedure(s) Performed: LAPAROSCOPIC CHOLECYSTECTOMY SINGLE SITE WITH INTRAOPERATIVE CHOLANGIOGRAM ERAS PATHWAY (N/A ) LAPAROSCOPIC POSSIBLE OPEN UMBILICAL HERNIA (N/A )  Patient Location: PACU  Anesthesia Type:General  Level of Consciousness: awake, alert , oriented and patient cooperative  Airway & Oxygen Therapy: Patient Spontanous Breathing and Patient connected to face mask oxygen  Post-op Assessment: Report given to RN, Post -op Vital signs reviewed and stable and Patient moving all extremities  Post vital signs: Reviewed and stable  Last Vitals:  Vitals Value Taken Time  BP 147/92 06/13/2018  3:01 PM  Temp    Pulse 100 06/13/2018  3:03 PM  Resp 22 06/13/2018  3:03 PM  SpO2 94 % 06/13/2018  3:03 PM  Vitals shown include unvalidated device data.  Last Pain:  Vitals:   06/13/18 1003  TempSrc: Oral         Complications: No apparent anesthesia complications

## 2018-06-13 NOTE — Op Note (Signed)
06/13/2018  PATIENT:  Gary Weaver  52 y.o. male  Patient Care Team: Lillard Anes, MD as PCP - General (Family Medicine) Michael Boston, MD as Consulting Physician (General Surgery) Danis, Kirke Corin, MD as Consulting Physician (Gastroenterology) Marijo File Revonda Standard., MD as Consulting Physician (Cardiology)  PRE-OPERATIVE DIAGNOSIS:    Acute on Chronic Calculus Cholecystitis  Umbilical hernia  POST-OPERATIVE DIAGNOSIS:   Acute on Chronic Calculus Cholecystitis Umbilical hernia  PROCEDURE:  SINGLE SITE Laparoscopic cholecystectomy with intraoperative cholangiogram  SURGEON:  Adin Hector, MD, FACS.  ASSISTANT: OR Staff   ANESTHESIA:    General with endotracheal intubation Local anesthetic as a field block  EBL:  (See Anesthesia Intraoperative Record) Total I/O In: 1000 [I.V.:1000] Out: 10 [Blood:10]  Delay start of Pharmacological VTE agent (>24hrs) due to surgical blood loss or risk of bleeding:  no  DRAINS: None   SPECIMEN: Gallbladder    DISPOSITION OF SPECIMEN:  PATHOLOGY  COUNTS:  YES  PLAN OF CARE: Discharge to home after PACU  PATIENT DISPOSITION:  PACU - hemodynamically stable.  INDICATION: Pleasant gentleman with episodes of pain concerning for biliary colic.  Accelerating symptoms with dwindling p.o. tolerance.  I offered cholecystectomy.  The anatomy & physiology of hepatobiliary & pancreatic function was discussed.  The pathophysiology of gallbladder dysfunction was discussed.  Natural history risks without surgery was discussed.   I feel the risks of no intervention will lead to serious problems that outweigh the operative risks; therefore, I recommended cholecystectomy to remove the pathology.  I explained laparoscopic techniques with possible need for an open approach.  Probable cholangiogram to evaluate the bilary tract was explained as well.    Risks such as bleeding, infection, abscess, leak, injury to other organs, need for  further treatment, heart attack, death, and other risks were discussed.  I noted a good likelihood this will help address the problem.  Possibility that this will not correct all abdominal symptoms was explained.  Goals of post-operative recovery were discussed as well.  We will work to minimize complications.  An educational handout further explaining the pathology and treatment options was given as well.  Questions were answered.  The patient expresses understanding & wishes to proceed with surgery.  OR FINDINGS: Chronic adhesions and white changes on gallbladder consistent with chronic cholecystitis.  Gallbladder with some edema and distention suspicious for early acute cholecystitis as well.  Narrowed but intact biliary system.  No choledocholithiasis or other concerns.  Liver: normal  DESCRIPTION:   The patient was identified & brought in the operating room. The patient was positioned supine with arms tucked. SCDs were active during the entire case. The patient underwent general anesthesia without any difficulty.  The abdomen was prepped and draped in a sterile fashion. A Surgical Timeout confirmed our plan.  I made a transverse curvilinear incision through the superior umbilical fold.  I placed a 49m long port through the supraumbilical fascia using a modified Hassan cutdown technique with umbilical stalk fascial countertraction. I began carbon dioxide insufflation.  No change in end tidal CO2 measurement.   Camera inspection revealed no injury. There were no adhesions to the anterior abdominal wall supraumbilically.  I proceeded to continue with single site technique. I placed a #5 port in left upper aspect of the wound. I placed a 5 mm atraumatic grasper in the right inferior aspect of the wound.  I turned attention to the right upper quadrant.  Gallbladder was pale and edematous.  I was able to  grasp and elevate the gallbladder.  It had moderate adhesions of greater omentum to it.  The  gallbladder fundus was elevated cephalad.  I had to decompress the gallbladder to better grasp it and aspirated thickened bile but no empyema.  I freed adhesions to the ventral surface of the gallbladder off carefully.  I freed the peritoneal coverings between the gallbladder and the liver on the posteriolateral and anteriomedial walls. I alternated between Harmonic & blunt Maryland dissection to help get a good critical view of the cystic artery and cystic duct.  did further dissection to free 80% of the gallbladder off the liver bed to get a good critical view of the infundibulum and cystic duct. I dissected out the cystic artery; and, after getting a good 360 view, ligated the anterior & posterior branches of the cystic artery close on the infundibulum using the Harmonic ultrasonic dissection.  I skeletonized the cystic duct.  I placed a clip on the infundibulum. I did a partial cystic duct-otomy and ensured patency. I placed a 5 [place] cholangiocatheter through a puncture site at the right subcostal ridge of the abdominal wall and directed it into the cystic duct.  While fluoroscopy set up, I were freed the remaining few attachments of the gallbladder to the liver edge.  We ran a cholangiogram with dilute radio-opaque contrast and continuous fluoroscopy. Contrast flowed from a side branch consistent with cystic duct cannulization. Contrast flowed up the common hepatic duct into the right and left intrahepatic chains out to secondary radicals. Contrast flowed down the common bile duct easily across the normal ampulla into the duodenum.  The biliary system was narrow but otherwise was consistent with a normal cholangiogram.  I removed the cholangiocatheter.  The cystic duct itself was rather edematous despite the narrow lumen.  I ligated with a 0 PDS Endoloop around the gallbladder into them to the base of the cystic duct.  I then placed clips on the cystic duct.  I completed cystic duct transection.  I  placed the gallbladder inside and eco-sac bag.  I then did copious irrigation.  3 L total with clear return.  There was good hemostasis on the gallbladder fossa of the liver and elsewhere. I inspected the rest of the abdomen & detected no injury nor bleeding elsewhere.  I removed the gallbladder out the umbilical hernia.  I freed the umbilical stalk off to the fascia to expose the 2 cm umbilical hernia.  Excised some redundant fat.  I closed the fascia of the umbilical hernia transversely using #1 PDS interrupted stitches.  Re-tacked the umbilical stalk down with 0 Vicryl suture.  I excised the redundant umbilical skin I have a more flat concave bellybutton.  I closed the skin using 4-0 monocryl stitch.  Sterile dressing was applied. The patient was extubated & arrived in the PACU in stable condition..  I had discussed postoperative care with the patient in the holding area. I discussed operative findings, updated the patient's status, discussed probable steps to recovery, and gave postoperative recommendations to the patient's spouse.  Recommendations were made.  Questions were answered.  She expressed understanding & appreciation.  Adin Hector, M.D., F.A.C.S. Gastrointestinal and Minimally Invasive Surgery [facility] [address] [place], [place] [address] [phone] Main / Paging  06/13/2018 2:49 PM

## 2018-06-13 NOTE — Discharge Instructions (Signed)
LAPAROSCOPIC SURGERY: POST OP INSTRUCTIONS ° °###################################################################### ° °EAT °Gradually transition to a high fiber diet with a fiber supplement over the next few weeks after discharge.  Start with a pureed / full liquid diet (see below) ° °WALK °Walk an hour a day.  Control your pain to do that.   ° °CONTROL PAIN °Control pain so that you can walk, sleep, tolerate sneezing/coughing, go up/down stairs. ° °HAVE A BOWEL MOVEMENT DAILY °Keep your bowels regular to avoid problems.  OK to try a laxative to override constipation.  OK to use an antidairrheal to slow down diarrhea.  Call if not better after 2 tries ° °CALL IF YOU HAVE PROBLEMS/CONCERNS °Call if you are still struggling despite following these instructions. °Call if you have concerns not answered by these instructions ° °###################################################################### ° ° ° °1. DIET: Follow a light bland diet the first 24 hours after arrival home, such as soup, liquids, crackers, etc.  Be sure to include lots of fluids daily.  Avoid fast food or heavy meals as your are more likely to get nauseated.  Eat a low fat the next few days after surgery.   °2. Take your usually prescribed home medications unless otherwise directed. °3. PAIN CONTROL: °a. Pain is best controlled by a usual combination of three different methods TOGETHER: °i. Ice/Heat °ii. Over the counter pain medication °iii. Prescription pain medication °b. Most patients will experience some swelling and bruising around the incisions.  Ice packs or heating pads (30-60 minutes up to 6 times a day) will help. Use ice for the first few days to help decrease swelling and bruising, then switch to heat to help relax tight/sore spots and speed recovery.  Some people prefer to use ice alone, heat alone, alternating between ice & heat.  Experiment to what works for you.  Swelling and bruising can take several weeks to resolve.   °c. It is  helpful to take an over-the-counter pain medication regularly for the first few weeks.  Choose one of the following that works best for you: °i. Naproxen (Aleve, etc)  Two 220mg tabs twice a day °ii. Ibuprofen (Advil, etc) Three 200mg tabs four times a day (every meal & bedtime) °iii. Acetaminophen (Tylenol, etc) 500-650mg four times a day (every meal & bedtime) °d. A  prescription for pain medication (such as oxycodone, hydrocodone, etc) should be given to you upon discharge.  Take your pain medication as prescribed.  °i. If you are having problems/concerns with the prescription medicine (does not control pain, nausea, vomiting, rash, itching, etc), please call us [phone] to see if we need to switch you to a different pain medicine that will work better for you and/or control your side effect better. °ii. If you need a refill on your pain medication, please contact your pharmacy.  They will contact our office to request authorization. Prescriptions will not be filled after 5 pm or on week-ends. °4. Avoid getting constipated.  Between the surgery and the pain medications, it is common to experience some constipation.  Increasing fluid intake and taking a fiber supplement (such as Metamucil, Citrucel, FiberCon, MiraLax, etc) 1-2 times a day regularly will usually help prevent this problem from occurring.  A mild laxative (prune juice, Milk of Magnesia, MiraLax, etc) should be taken according to package directions if there are no bowel movements after 48 hours.   °5. Watch out for diarrhea.  If you have many loose bowel movements, simplify your diet to bland foods & liquids for   a few days.  Stop any stool softeners and decrease your fiber supplement.  Switching to mild anti-diarrheal medications (Kayopectate, Pepto Bismol) can help.  If this worsens or does not improve, please call us. °6. Wash / shower every day.  You may shower over the dressings as they are waterproof.  Continue to shower over incision(s)  after the dressing is off. °7. Remove your waterproof bandages 5 days after surgery.  You may leave the incision open to air.  You may replace a dressing/Band-Aid to cover the incision for comfort if you wish.  °8. ACTIVITIES as tolerated:   °a. You may resume regular (light) daily activities beginning the next day--such as daily self-care, walking, climbing stairs--gradually increasing activities as tolerated.  If you can walk 30 minutes without difficulty, it is safe to try more intense activity such as jogging, treadmill, bicycling, low-impact aerobics, swimming, etc. °b. Save the most intensive and strenuous activity for last such as sit-ups, heavy lifting, contact sports, etc  Refrain from any heavy lifting or straining until you are off narcotics for pain control.   °c. DO NOT PUSH THROUGH PAIN.  Let pain be your guide: If it hurts to do something, don't do it.  Pain is your body warning you to avoid that activity for another week until the pain goes down. °d. You may drive when you are no longer taking prescription pain medication, you can comfortably wear a seatbelt, and you can safely maneuver your car and apply brakes. °e. You may have sexual intercourse when it is comfortable.  °9. FOLLOW UP in our office °a. Please call [facility] at [phone] to set up an appointment to see your surgeon in the office for a follow-up appointment approximately 2-3 weeks after your surgery. °b. Make sure that you call for this appointment the day you arrive home to insure a convenient appointment time. °10. IF YOU HAVE DISABILITY OR FAMILY LEAVE FORMS, BRING THEM TO THE OFFICE FOR PROCESSING.  DO NOT GIVE THEM TO YOUR DOCTOR. ° ° °WHEN TO CALL US [phone]: °1. Poor pain control °2. Reactions / problems with new medications (rash/itching, nausea, etc)  °3. Fever over 101.5 F (38.5 C) °4. Inability to urinate °5. Nausea and/or vomiting °6. Worsening swelling or bruising °7. Continued bleeding from incision. °8. Increased  pain, redness, or drainage from the incision ° ° The clinic staff is available to answer your questions during regular business hours (8:30am-5pm).  Please don’t hesitate to call and ask to speak to one of our nurses for clinical concerns.  ° If you have a medical emergency, go to the nearest emergency room or call 911. ° A surgeon from [facility] is always on call at the hospitals ° ° °[facility] °[address], [address], [place], [place]  [address] ? °MAIN: [phone] ? TOLL FREE: [phone] ?  °FAX [phone] °[email or web address] ° ° °

## 2018-06-13 NOTE — Anesthesia Procedure Notes (Signed)
Procedure Name: Intubation Date/Time: 06/13/2018 1:33 PM Performed by: Vanessa Port O'Connor, CRNA Pre-anesthesia Checklist: Patient identified, Emergency Drugs available, Suction available and Patient being monitored Patient Re-evaluated:Patient Re-evaluated prior to induction Oxygen Delivery Method: Circle system utilized Preoxygenation: Pre-oxygenation with 100% oxygen Induction Type: IV induction Ventilation: Mask ventilation without difficulty and Oral airway inserted - appropriate to patient size Laryngoscope Size: 2 and Miller Grade View: Grade I Tube type: Oral Tube size: 7.5 mm Number of attempts: 1 Airway Equipment and Method: Stylet Placement Confirmation: ETT inserted through vocal cords under direct vision,  positive ETCO2 and breath sounds checked- equal and bilateral Secured at: 22 cm Tube secured with: Tape Dental Injury: Teeth and Oropharynx as per pre-operative assessment

## 2018-06-13 NOTE — Interval H&P Note (Signed)
History and Physical Interval Note:  06/13/2018 1:13 PM  Gary Weaver  has presented today for surgery, with the diagnosis of Symptomatic biliary colic, probable chronic choleystitis, umbilcal hernia  The various methods of treatment have been discussed with the patient and family. After consideration of risks, benefits and other options for treatment, the patient has consented to  Procedure(s): [place] CHOLANGIOGRAM ERAS PATHWAY (N/A) LAPAROSCOPIC POSSIBLE OPEN UMBILICAL HERNIA (N/A) as a surgical intervention .  The patient's history has been reviewed, patient examined, no change in status, stable for surgery.  I have reviewed the patient's chart and labs.  Questions were answered to the patient's satisfaction.    I have re-reviewed the the patient's records, history, medications, and allergies.  I have re-examined the patient.  I again discussed intraoperative plans and goals of post-operative recovery.  The patient agrees to proceed.  Gary Weaver  [birth year] [id number]  Patient Care Team: Lillard Anes, MD as PCP - General (Family Medicine) Michael Boston, MD as Consulting Physician (General Surgery) Danis, Kirke Corin, MD as Consulting Physician (Gastroenterology) Marijo File Revonda Standard., MD as Consulting Physician (Cardiology)  Patient Active Problem List   Diagnosis Date Noted  . RUQ abdominal pain 11/28/2017  . Gastroesophageal reflux disease 11/28/2017  . Chronic pancreatitis ([place]) 05/09/2017  . Hx of acute pancreatitis 05/09/2017  . Epigastric pain 05/09/2017  . Snoring 11/14/2016  . 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 Decreased attention Span 03/30/2015  . Neck pain 10/22/2014  . Lower back pain 10/22/2014  . Radiculopathy, lumbar region 10/22/2014  . Facial numbness 10/22/2014  . Dysesthesia 10/22/2014  . Multiple joint pain 10/22/2014  . Myalgia 10/22/2014  . Insomnia  10/22/2014  . Palpitations 03/04/2014  . Chest pain 02/09/2014  . HTN (hypertension) 02/09/2014    Past Medical History:  Diagnosis Date  . Anxiety   . Arrhythmia   . Arthritis   . Asthma   . GERD (gastroesophageal reflux disease)   . Gluten intolerance   . Headache   . Hearing loss   . Hyperlipidemia   . Hypertension   . Memory loss   . Pancreatitis   . Pneumonia    hx of   . Vision abnormalities     Past Surgical History:  Procedure Laterality Date  . COLONOSCOPY    . UPPER GASTROINTESTINAL ENDOSCOPY    . WISDOM TOOTH EXTRACTION      Social History   Socioeconomic History  . Marital status: Married    Spouse name: Not on file  . Number of children: 3  . Years of education: Not on file  . Highest education level: Not on file  Occupational History  . Not on file  Social Needs  . Financial resource strain: Not on file  . Food insecurity:    Worry: Not on file    Inability: Not on file  . Transportation needs:    Medical: Not on file    Non-medical: Not on file  Tobacco Use  . Smoking status: Never Smoker  . Smokeless tobacco: Former Systems developer    Types: Chew  Substance and Sexual Activity  . Alcohol use: Not Currently  . Drug use: No  . Sexual activity: Not on file  Lifestyle  . Physical activity:    Days per week: Not on file    Minutes per session: Not on file  . Stress: Not on file  Relationships  . Social connections:  Talks on phone: Not on file    Gets together: Not on file    Attends religious service: Not on file    Active member of club or organization: Not on file    Attends meetings of clubs or organizations: Not on file    Relationship status: Not on file  . Intimate partner violence:    Fear of current or ex partner: Not on file    Emotionally abused: Not on file    Physically abused: Not on file    Forced sexual activity: Not on file  Other Topics Concern  . Not on file  Social History Narrative  . Not on file    Family  History  Problem Relation Age of Onset  . Hypertension Father   . Prostate cancer Father   . Colon polyps Father   . Bladder Cancer Mother   . Colon cancer Maternal Grandmother   . Esophageal cancer Neg Hx   . Rectal cancer Neg Hx   . Stomach cancer Neg Hx   . Pancreatic cancer Neg Hx     Facility-Administered Medications Prior to Admission  Medication Dose Route Frequency Provider Last Rate Last Dose  . 0.9 %  sodium chloride infusion  500 mL Intravenous Once Doran Stabler, MD       Medications Prior to Admission  Medication Sig Dispense Refill Last Dose  . albuterol (PROVENTIL HFA;VENTOLIN HFA) 108 (90 Base) MCG/ACT inhaler Inhale 2 puffs into the lungs every 6 (six) hours as needed for wheezing or shortness of breath.   Past Week at Unknown time  . cholecalciferol (VITAMIN D) 1000 units tablet Take 4,000 Units by mouth daily.   Past Month at Unknown time  . cyclobenzaprine (FLEXERIL) 5 MG tablet Take 1 tablet (5 mg total) by mouth at bedtime. (Patient taking differently: Take 5 mg by mouth 3 (three) times daily as needed for muscle spasms. ) 30 tablet 11 Past Week at Unknown time  . fexofenadine (ALLEGRA) 180 MG tablet Take 180 mg by mouth daily.   06/13/2018 at 0630  . gabapentin (NEURONTIN) 300 MG capsule Take 300-2,400 mg by mouth 3 (three) times daily as needed (pain).   Past Week at Unknown time  . hydrOXYzine (VISTARIL) 25 MG capsule Take 25 mg by mouth daily.    06/12/2018 at Unknown time  . metoCLOPramide (REGLAN) 5 MG tablet Take 5 mg by mouth every 6 (six) hours as needed for nausea or vomiting.    06/12/2018 at Unknown time  . omeprazole (PRILOSEC) 40 MG capsule Take 40 mg by mouth daily.   06/13/2018 at 0630  . ondansetron (ZOFRAN ODT) 4 MG disintegrating tablet Take 1 tablet (4 mg total) by mouth every 8 (eight) hours as needed for nausea or vomiting. (Patient taking differently: Take 4 mg by mouth at bedtime. ) 20 tablet 0 06/12/2018 at Unknown time  . traMADol  (ULTRAM) 50 MG tablet Take 1 tablet (50 mg total) by mouth every 6 (six) hours as needed. (Patient taking differently: Take 50 mg by mouth at bedtime. ) 12 tablet 0 Past Week at Unknown time  . valsartan (DIOVAN) 160 MG tablet Take 80 mg by mouth daily.   06/12/2018 at Unknown time  . gabapentin (NEURONTIN) 600 MG tablet Take 1 tablet (600 mg total) by mouth 3 (three) times daily. 90 tablet 11 Taking    Current Facility-Administered Medications  Medication Dose Route Frequency Provider Last Rate Last Dose  . bupivacaine liposome (EXPAREL) 1.3 %  injection 266 mg  20 mL Infiltration Once Michael Boston, MD      . ceFAZolin (ANCEF) IVPB 2g/100 mL premix  2 g Intravenous On Call to OR Michael Boston, MD       And  . metroNIDAZOLE (FLAGYL) IVPB 500 mg  500 mg Intravenous On Call to OR Michael Boston, MD      . Chlorhexidine Gluconate Cloth 2 % PADS 6 each  6 each Topical Once Michael Boston, MD       And  . Chlorhexidine Gluconate Cloth 2 % PADS 6 each  6 each Topical Once Michael Boston, MD      . lactated ringers infusion   Intravenous Continuous Lynda Rainwater, MD 50 mL/hr at 06/13/18 1033       Allergies  Allergen Reactions  . Eggs Or Egg-Derived Products Diarrhea  . Gluten Meal Nausea And Vomiting  . Latex Rash    Occurs after long use of latex    BP (!) 124/97   Pulse 79   Temp 98.3 F (36.8 C) (Oral)   Resp 16   Ht 5' 6"  (1.676 m)   Wt 86.6 kg   SpO2 99%   BMI 30.83 kg/m   Labs: No results found for this or any previous visit (from the past 48 hour(s)).  Imaging / Studies: Ct Abdomen Pelvis W Contrast  Result Date: 06/09/2018 CLINICAL DATA:  52 year old male with abdominal pain and nausea. EXAM: CT ABDOMEN AND PELVIS WITH CONTRAST TECHNIQUE: Multidetector CT imaging of the abdomen and pelvis was performed using the standard protocol following bolus administration of intravenous contrast. CONTRAST:  177m ISOVUE-300 IOPAMIDOL (ISOVUE-300) INJECTION 61% COMPARISON:  CT of  the abdomen pelvis dated 04/04/2017 and MRI dated 05/16/2017 FINDINGS: Lower chest: The visualized lung bases are clear. No intra-abdominal free air or free fluid. Hepatobiliary: Subcentimeter hepatic hypodense lesion is too small to characterize but likely a cyst. The liver is otherwise unremarkable. No intrahepatic biliary ductal dilatation. The gallbladder is unremarkable. Pancreas: Unremarkable. No pancreatic ductal dilatation or surrounding inflammatory changes. Spleen: Normal in size without focal abnormality. Adrenals/Urinary Tract: Adrenal glands are unremarkable. Kidneys are normal, without renal calculi, focal lesion, or hydronephrosis. Bladder is unremarkable. Stomach/Bowel: Stomach is within normal limits. Appendix appears normal. No evidence of bowel wall thickening, distention, or inflammatory changes. Vascular/Lymphatic: No significant vascular findings are present. No enlarged abdominal or pelvic lymph nodes. Reproductive: The prostate and seminal vesicles are grossly unremarkable. Other: Small fat containing umbilical hernia. Musculoskeletal: No acute or significant osseous findings. IMPRESSION: No acute intra-abdominal or pelvic pathology. Electronically Signed   By: AAnner CreteM.D.   On: 06/09/2018 00:02     .SAdin Hector M.D., F.A.C.S. Gastrointestinal and Minimally Invasive Surgery [facility] [address] [place] [phone] / Paging  06/13/2018 1:13 PM    SAdin Hector

## 2018-06-13 NOTE — Anesthesia Preprocedure Evaluation (Signed)
Anesthesia Evaluation  Patient identified by MRN, date of birth, ID band Patient awake    Reviewed: Allergy & Precautions, NPO status , Patient's Chart, lab work & pertinent test results  Airway Mallampati: II  TM Distance: >3 FB Neck ROM: Full    Dental no notable dental hx.    Pulmonary asthma ,    Pulmonary exam normal breath sounds clear to auscultation       Cardiovascular hypertension, Normal cardiovascular exam Rhythm:Regular Rate:Normal     Neuro/Psych negative neurological ROS  negative psych ROS   GI/Hepatic Neg liver ROS, GERD  ,  Endo/Other  negative endocrine ROS  Renal/GU negative Renal ROS  negative genitourinary   Musculoskeletal negative musculoskeletal ROS (+)   Abdominal   Peds negative pediatric ROS (+)  Hematology negative hematology ROS (+)   Anesthesia Other Findings   Reproductive/Obstetrics negative OB ROS                             Anesthesia Physical Anesthesia Plan  ASA: II  Anesthesia Plan: General   Post-op Pain Management:    Induction: Intravenous  PONV Risk Score and Plan: 2 and Ondansetron, Dexamethasone and Treatment may vary due to age or medical condition  Airway Management Planned: Oral ETT  Additional Equipment:   Intra-op Plan:   Post-operative Plan: Extubation in OR  Informed Consent: I have reviewed the patients History and Physical, chart, labs and discussed the procedure including the risks, benefits and alternatives for the proposed anesthesia with the patient or authorized representative who has indicated his/her understanding and acceptance.   Dental advisory given  Plan Discussed with: CRNA and Surgeon  Anesthesia Plan Comments:         Anesthesia Quick Evaluation

## 2018-06-14 ENCOUNTER — Encounter (HOSPITAL_COMMUNITY): Payer: Self-pay | Admitting: Surgery

## 2018-06-14 NOTE — Anesthesia Postprocedure Evaluation (Signed)
Anesthesia Post Note  Patient: Gary Weaver  Procedure(s) Performed: LAPAROSCOPIC CHOLECYSTECTOMY SINGLE SITE WITH INTRAOPERATIVE CHOLANGIOGRAM ERAS PATHWAY (N/A ) LAPAROSCOPIC POSSIBLE OPEN UMBILICAL HERNIA (N/A )     Patient location during evaluation: PACU Anesthesia Type: General Level of consciousness: awake and alert Pain management: pain level controlled Vital Signs Assessment: post-procedure vital signs reviewed and stable Respiratory status: spontaneous breathing, nonlabored ventilation, respiratory function stable and patient connected to nasal cannula oxygen Cardiovascular status: blood pressure returned to baseline and stable Postop Assessment: no apparent nausea or vomiting Anesthetic complications: no    Last Vitals:  Vitals:   06/13/18 1545 06/13/18 1600  BP: (!) 137/96 (!) 129/92  Pulse: 85 88  Resp: 15 18  Temp:  36.7 C  SpO2: 97% 98%    Last Pain:  Vitals:   06/13/18 1600  TempSrc:   PainSc: 5                  Gearldean Lomanto S

## 2018-06-20 ENCOUNTER — Other Ambulatory Visit: Payer: Self-pay

## 2018-06-20 ENCOUNTER — Other Ambulatory Visit (HOSPITAL_COMMUNITY): Payer: Self-pay | Admitting: Surgery

## 2018-06-20 ENCOUNTER — Ambulatory Visit
Admission: RE | Admit: 2018-06-20 | Discharge: 2018-06-20 | Disposition: A | Discharge status: Home / Self Care | Payer: BC Managed Care – PPO | Source: Ambulatory Visit | Attending: Surgery | Admitting: Surgery

## 2018-06-20 ENCOUNTER — Other Ambulatory Visit: Payer: Self-pay | Admitting: Surgery

## 2018-06-20 ENCOUNTER — Encounter (HOSPITAL_COMMUNITY): Payer: Self-pay | Admitting: General Practice

## 2018-06-20 ENCOUNTER — Inpatient Hospital Stay (HOSPITAL_COMMUNITY)
Admission: AD | Admit: 2018-06-20 | Discharge: 2018-06-24 | DRG: 394 | Disposition: A | Discharge status: Home / Self Care | Payer: BC Managed Care – PPO | Attending: Surgery | Admitting: Surgery

## 2018-06-20 DIAGNOSIS — Z91018 Allergy to other foods: Secondary | ICD-10-CM

## 2018-06-20 DIAGNOSIS — E785 Hyperlipidemia, unspecified: Secondary | ICD-10-CM | POA: Diagnosis present

## 2018-06-20 DIAGNOSIS — K9189 Other postprocedural complications and disorders of digestive system: Principal | ICD-10-CM | POA: Diagnosis present

## 2018-06-20 DIAGNOSIS — Z9049 Acquired absence of other specified parts of digestive tract: Secondary | ICD-10-CM

## 2018-06-20 DIAGNOSIS — K838 Other specified diseases of biliary tract: Secondary | ICD-10-CM | POA: Diagnosis present

## 2018-06-20 DIAGNOSIS — Z800 Family history of malignant neoplasm of digestive organs: Secondary | ICD-10-CM

## 2018-06-20 DIAGNOSIS — K831 Obstruction of bile duct: Secondary | ICD-10-CM

## 2018-06-20 DIAGNOSIS — K8050 Calculus of bile duct without cholangitis or cholecystitis without obstruction: Secondary | ICD-10-CM | POA: Diagnosis not present

## 2018-06-20 DIAGNOSIS — Z8719 Personal history of other diseases of the digestive system: Secondary | ICD-10-CM

## 2018-06-20 DIAGNOSIS — I10 Essential (primary) hypertension: Secondary | ICD-10-CM | POA: Diagnosis present

## 2018-06-20 DIAGNOSIS — K861 Other chronic pancreatitis: Secondary | ICD-10-CM | POA: Diagnosis present

## 2018-06-20 DIAGNOSIS — Z79899 Other long term (current) drug therapy: Secondary | ICD-10-CM

## 2018-06-20 DIAGNOSIS — G4700 Insomnia, unspecified: Secondary | ICD-10-CM | POA: Diagnosis present

## 2018-06-20 DIAGNOSIS — M2550 Pain in unspecified joint: Secondary | ICD-10-CM | POA: Diagnosis present

## 2018-06-20 DIAGNOSIS — K219 Gastro-esophageal reflux disease without esophagitis: Secondary | ICD-10-CM | POA: Diagnosis present

## 2018-06-20 DIAGNOSIS — K668 Other specified disorders of peritoneum: Secondary | ICD-10-CM

## 2018-06-20 DIAGNOSIS — Z87891 Personal history of nicotine dependence: Secondary | ICD-10-CM

## 2018-06-20 DIAGNOSIS — K839 Disease of biliary tract, unspecified: Secondary | ICD-10-CM

## 2018-06-20 DIAGNOSIS — Z8249 Family history of ischemic heart disease and other diseases of the circulatory system: Secondary | ICD-10-CM

## 2018-06-20 DIAGNOSIS — Z91012 Allergy to eggs: Secondary | ICD-10-CM

## 2018-06-20 DIAGNOSIS — Z8052 Family history of malignant neoplasm of bladder: Secondary | ICD-10-CM

## 2018-06-20 DIAGNOSIS — R188 Other ascites: Secondary | ICD-10-CM

## 2018-06-20 DIAGNOSIS — Z91040 Latex allergy status: Secondary | ICD-10-CM

## 2018-06-20 DIAGNOSIS — Z8042 Family history of malignant neoplasm of prostate: Secondary | ICD-10-CM

## 2018-06-20 DIAGNOSIS — Z8371 Family history of colonic polyps: Secondary | ICD-10-CM

## 2018-06-20 DIAGNOSIS — K429 Umbilical hernia without obstruction or gangrene: Secondary | ICD-10-CM | POA: Diagnosis present

## 2018-06-20 DIAGNOSIS — J45909 Unspecified asthma, uncomplicated: Secondary | ICD-10-CM | POA: Diagnosis present

## 2018-06-20 DIAGNOSIS — H9190 Unspecified hearing loss, unspecified ear: Secondary | ICD-10-CM | POA: Diagnosis present

## 2018-06-20 DIAGNOSIS — K812 Acute cholecystitis with chronic cholecystitis: Secondary | ICD-10-CM | POA: Diagnosis present

## 2018-06-20 DIAGNOSIS — M5416 Radiculopathy, lumbar region: Secondary | ICD-10-CM | POA: Diagnosis present

## 2018-06-20 HISTORY — DX: Other ascites: R18.8

## 2018-06-20 MED ORDER — LORAZEPAM 2 MG/ML IJ SOLN: 0.5000 mg | Freq: Three times a day (TID) | INTRAMUSCULAR | Status: DC | PRN

## 2018-06-20 MED ORDER — MAGIC MOUTHWASH: 15.0000 mL | Freq: Four times a day (QID) | ORAL | Status: DC | PRN

## 2018-06-20 MED ORDER — HYDROMORPHONE HCL 1 MG/ML IJ SOLN
0.5000 mg | INTRAMUSCULAR | Status: DC | PRN
  Administered 2018-06-21 (×2): 1 mg via INTRAVENOUS
  Administered 2018-06-21: 2 mg via INTRAVENOUS
  Administered 2018-06-21: 0.5 mg via INTRAVENOUS
  Administered 2018-06-21 – 2018-06-22 (×2): 1 mg via INTRAVENOUS
  Administered 2018-06-22: 2 mg via INTRAVENOUS
  Administered 2018-06-22 (×3): 1 mg via INTRAVENOUS
  Administered 2018-06-23 (×3): 2 mg via INTRAVENOUS
  Administered 2018-06-24: 1 mg via INTRAVENOUS
  Filled 2018-06-20: qty 1
  Filled 2018-06-20: qty 2
  Filled 2018-06-20 (×2): qty 1
  Filled 2018-06-20: qty 2
  Filled 2018-06-20 (×3): qty 1
  Filled 2018-06-20: qty 2
  Filled 2018-06-20: qty 1
  Filled 2018-06-20: qty 2
  Filled 2018-06-20 (×2): qty 1
  Filled 2018-06-20: qty 2

## 2018-06-20 MED ORDER — HYDRALAZINE HCL 20 MG/ML IJ SOLN: 5.0000 mg | INTRAMUSCULAR | Status: DC | PRN

## 2018-06-20 MED ORDER — OXYCODONE HCL 5 MG PO TABS
5.0000 mg | ORAL_TABLET | ORAL | Status: DC | PRN
  Administered 2018-06-20 – 2018-06-24 (×8): 10 mg via ORAL
  Filled 2018-06-20 (×8): qty 2

## 2018-06-20 MED ORDER — ACETAMINOPHEN 325 MG PO TABS: 650.0000 mg | ORAL_TABLET | Freq: Four times a day (QID) | ORAL | Status: DC | PRN

## 2018-06-20 MED ORDER — DIPHENHYDRAMINE HCL 12.5 MG/5ML PO ELIX: 12.5000 mg | ORAL_SOLUTION | Freq: Four times a day (QID) | ORAL | Status: DC | PRN

## 2018-06-20 MED ORDER — ONDANSETRON HCL 4 MG/2ML IJ SOLN
4.0000 mg | Freq: Four times a day (QID) | INTRAMUSCULAR | Status: DC | PRN
  Administered 2018-06-23: 4 mg via INTRAVENOUS

## 2018-06-20 MED ORDER — METOCLOPRAMIDE HCL 5 MG PO TABS
5.0000 mg | ORAL_TABLET | Freq: Four times a day (QID) | ORAL | Status: DC | PRN
  Administered 2018-06-20 – 2018-06-21 (×2): 5 mg via ORAL
  Filled 2018-06-20 (×2): qty 1

## 2018-06-20 MED ORDER — GABAPENTIN 300 MG PO CAPS
300.0000 mg | ORAL_CAPSULE | Freq: Every day | ORAL | Status: AC
  Administered 2018-06-20 – 2018-06-22 (×3): 300 mg via ORAL
  Filled 2018-06-20 (×3): qty 1

## 2018-06-20 MED ORDER — PROCHLORPERAZINE EDISYLATE 10 MG/2ML IJ SOLN: 5.0000 mg | INTRAMUSCULAR | Status: DC | PRN

## 2018-06-20 MED ORDER — LACTATED RINGERS IV BOLUS: 1000.0000 mL | Freq: Three times a day (TID) | INTRAVENOUS | Status: DC | PRN

## 2018-06-20 MED ORDER — LIP MEDEX EX OINT
1.0000 "application " | TOPICAL_OINTMENT | Freq: Two times a day (BID) | CUTANEOUS | Status: DC
  Administered 2018-06-20 – 2018-06-24 (×5): 1 via TOPICAL
  Filled 2018-06-20: qty 7

## 2018-06-20 MED ORDER — ACETAMINOPHEN 650 MG RE SUPP: 650.0000 mg | Freq: Four times a day (QID) | RECTAL | Status: DC | PRN

## 2018-06-20 MED ORDER — METHOCARBAMOL 1000 MG/10ML IJ SOLN
1000.0000 mg | Freq: Four times a day (QID) | INTRAVENOUS | Status: DC | PRN
  Filled 2018-06-20 (×2): qty 10

## 2018-06-20 MED ORDER — SIMETHICONE 80 MG PO CHEW
40.0000 mg | CHEWABLE_TABLET | Freq: Four times a day (QID) | ORAL | Status: DC | PRN
  Administered 2018-06-23: 40 mg via ORAL
  Filled 2018-06-20: qty 1

## 2018-06-20 MED ORDER — METOPROLOL TARTRATE 5 MG/5ML IV SOLN: 5.0000 mg | Freq: Four times a day (QID) | INTRAVENOUS | Status: DC | PRN

## 2018-06-20 MED ORDER — ONDANSETRON 4 MG PO TBDP: 4.0000 mg | ORAL_TABLET | Freq: Four times a day (QID) | ORAL | Status: DC | PRN

## 2018-06-20 MED ORDER — IOPAMIDOL (ISOVUE-300) INJECTION 61%
100.0000 mL | Freq: Once | INTRAVENOUS | Status: AC | PRN
  Administered 2018-06-20: 100 mL via INTRAVENOUS

## 2018-06-20 MED ORDER — LACTATED RINGERS IV SOLN
INTRAVENOUS | Status: DC
  Administered 2018-06-20 – 2018-06-23 (×5): via INTRAVENOUS
  Administered 2018-06-23: 1000 mL via INTRAVENOUS
  Administered 2018-06-23 (×2): via INTRAVENOUS

## 2018-06-20 MED ORDER — DIPHENHYDRAMINE HCL 50 MG/ML IJ SOLN: 12.5000 mg | Freq: Four times a day (QID) | INTRAMUSCULAR | Status: DC | PRN

## 2018-06-20 NOTE — Progress Notes (Signed)
Gary Weaver is a 52 y.o. male patient admitted. Awake, alert - oriented  X 4 - no acute distress noted.  VSS - Blood pressure 120/86, pulse (!) 112, temperature 98.4 F (36.9 C), temperature source Oral, resp. rate (!) 22, height 5\' 6"  (1.676 m), weight 87.1 kg, SpO2 91 %.    IV in place, occlusive dsg intact without redness.  Orientation to room, and floor completed.  Admission INP armband ID verified with patient/family, and in place.   SR up x 2, fall assessment complete, with patient and family able to verbalize understanding of risk associated with falls, and verbalized understanding to call nsg before up out of bed.  Call light within reach, patient able to voice, and demonstrate understanding. No evidence of skin break down noted on exam.  Admission nurse notified of admission.     Will cont to eval and treat per MD orders.  Krystal Clark, RN 06/20/2018 4:27 PM

## 2018-06-20 NOTE — H&P (Signed)
Delete - duplicate

## 2018-06-21 ENCOUNTER — Observation Stay (HOSPITAL_COMMUNITY): Payer: BC Managed Care – PPO

## 2018-06-21 LAB — COMPREHENSIVE METABOLIC PANEL
ALT: 32 U/L (ref 0–44)
AST: 26 U/L (ref 15–41)
Albumin: 3 g/dL — ABNORMAL LOW (ref 3.5–5.0)
Alkaline Phosphatase: 114 U/L (ref 38–126)
Anion gap: 9 (ref 5–15)
BUN: 10 mg/dL (ref 6–20)
CHLORIDE: 98 mmol/L (ref 98–111)
CO2: 24 mmol/L (ref 22–32)
CREATININE: 1.27 mg/dL — AB (ref 0.61–1.24)
Calcium: 8.7 mg/dL — ABNORMAL LOW (ref 8.9–10.3)
GFR calc Af Amer: >60 mL/min (ref >60)
GFR calc non Af Amer: >60 mL/min (ref >60)
Glucose, Bld: 125 mg/dL — ABNORMAL HIGH (ref 70–99)
Potassium: 3.8 mmol/L (ref 3.5–5.1)
SODIUM: 131 mmol/L — AB (ref 135–145)
Total Bilirubin: 1.7 mg/dL — ABNORMAL HIGH (ref 0.3–1.2)
Total Protein: 6.1 g/dL — ABNORMAL LOW (ref 6.5–8.1)

## 2018-06-21 LAB — CBC
HCT: 34.5 % — ABNORMAL LOW (ref 39.0–52.0)
Hemoglobin: 11.4 g/dL — ABNORMAL LOW (ref 13.0–17.0)
MCH: 29.1 pg (ref 26.0–34.0)
MCHC: 33 g/dL (ref 30.0–36.0)
MCV: 88 fL (ref 80.0–100.0)
NRBC: 0 % (ref 0.0–0.2)
PLATELETS: 338 10*3/uL (ref 150–400)
RBC: 3.92 MIL/uL — AB (ref 4.22–5.81)
RDW: 12.3 % (ref 11.5–15.5)
WBC: 12.7 10*3/uL — ABNORMAL HIGH (ref 4.0–10.5)

## 2018-06-21 LAB — PROTIME-INR
INR: 1.13
Prothrombin Time: 14.4 seconds (ref 11.4–15.2)

## 2018-06-21 LAB — GRAM STAIN: Gram Stain: NONE SEEN

## 2018-06-21 MED ORDER — MIDAZOLAM HCL 2 MG/2ML IJ SOLN
INTRAMUSCULAR | Status: AC
  Filled 2018-06-21: qty 2

## 2018-06-21 MED ORDER — FENTANYL CITRATE (PF) 100 MCG/2ML IJ SOLN
INTRAMUSCULAR | Status: AC
  Filled 2018-06-21: qty 2

## 2018-06-21 MED ORDER — PANTOPRAZOLE SODIUM 40 MG PO TBEC
40.0000 mg | DELAYED_RELEASE_TABLET | Freq: Every day | ORAL | Status: DC
  Administered 2018-06-21 – 2018-06-24 (×3): 40 mg via ORAL
  Filled 2018-06-21 (×3): qty 1

## 2018-06-21 MED ORDER — LIDOCAINE HCL (PF) 1 % IJ SOLN
INTRAMUSCULAR | Status: AC
  Filled 2018-06-21: qty 30

## 2018-06-21 MED ORDER — MIDAZOLAM HCL 2 MG/2ML IJ SOLN
INTRAMUSCULAR | Status: AC | PRN
  Administered 2018-06-21 (×2): 0.5 mg via INTRAVENOUS
  Administered 2018-06-21: 1 mg via INTRAVENOUS

## 2018-06-21 MED ORDER — FENTANYL CITRATE (PF) 100 MCG/2ML IJ SOLN
INTRAMUSCULAR | Status: AC | PRN
  Administered 2018-06-21 (×4): 25 ug via INTRAVENOUS

## 2018-06-21 MED ORDER — LORATADINE 10 MG PO TABS
10.0000 mg | ORAL_TABLET | Freq: Every day | ORAL | Status: DC
  Administered 2018-06-22 – 2018-06-24 (×2): 10 mg via ORAL
  Filled 2018-06-21 (×2): qty 1

## 2018-06-21 MED ORDER — HYDROXYZINE HCL 25 MG PO TABS
25.0000 mg | ORAL_TABLET | Freq: Every day | ORAL | Status: DC
  Administered 2018-06-22 – 2018-06-24 (×2): 25 mg via ORAL
  Filled 2018-06-21 (×2): qty 1

## 2018-06-21 MED ORDER — ALBUTEROL SULFATE (2.5 MG/3ML) 0.083% IN NEBU: 3.0000 mL | INHALATION_SOLUTION | Freq: Four times a day (QID) | RESPIRATORY_TRACT | Status: DC | PRN

## 2018-06-21 MED ORDER — IRBESARTAN 150 MG PO TABS
150.0000 mg | ORAL_TABLET | Freq: Every day | ORAL | Status: DC
  Administered 2018-06-21 – 2018-06-24 (×3): 150 mg via ORAL
  Filled 2018-06-21 (×3): qty 1

## 2018-06-21 MED ORDER — VITAMIN D 25 MCG (1000 UNIT) PO TABS
4000.0000 [IU] | ORAL_TABLET | Freq: Every day | ORAL | Status: DC
  Administered 2018-06-22 – 2018-06-24 (×2): 4000 [IU] via ORAL

## 2018-06-21 MED ORDER — TRAMADOL HCL 50 MG PO TABS
50.0000 mg | ORAL_TABLET | Freq: Four times a day (QID) | ORAL | Status: DC | PRN
  Administered 2018-06-24: 100 mg via ORAL
  Filled 2018-06-21: qty 2

## 2018-06-21 MED ORDER — LACTATED RINGERS IV BOLUS
1000.0000 mL | Freq: Once | INTRAVENOUS | Status: AC
  Administered 2018-06-21: 1000 mL via INTRAVENOUS

## 2018-06-21 MED ORDER — SODIUM CHLORIDE 0.9% FLUSH
5.0000 mL | Freq: Three times a day (TID) | INTRAVENOUS | Status: DC
  Administered 2018-06-21 – 2018-06-24 (×9): 5 mL

## 2018-06-21 NOTE — Plan of Care (Signed)
  Problem: Education: Goal: Knowledge of General Education information will improve Description Including pain rating scale, medication(s)/side effects and non-pharmacologic comfort measures Outcome: Progressing   Problem: Health Behavior/Discharge Planning: Goal: Ability to manage health-related needs will improve Outcome: Progressing   Problem: Clinical Measurements: Goal: Ability to maintain clinical measurements within normal limits will improve Outcome: Progressing Goal: Will remain free from infection Outcome: Progressing Goal: Respiratory complications will improve Outcome: Progressing Goal: Cardiovascular complication will be avoided Outcome: Progressing   Problem: Activity: Goal: Risk for activity intolerance will decrease Outcome: Progressing   Problem: Nutrition: Goal: Adequate nutrition will be maintained Outcome: Progressing   Problem: Coping: Goal: Level of anxiety will decrease Outcome: Progressing   Problem: Elimination: Goal: Will not experience complications related to bowel motility Outcome: Progressing Goal: Will not experience complications related to urinary retention Outcome: Progressing   Problem: Pain Managment: Goal: General experience of comfort will improve Outcome: Progressing   Problem: Safety: Goal: Ability to remain free from injury will improve Outcome: Progressing   Problem: Skin Integrity: Goal: Risk for impaired skin integrity will decrease Outcome: Progressing   Sonny Masters, RN 06/21/2018

## 2018-06-21 NOTE — Consult Note (Signed)
Chief Complaint: Patient was seen in consultation today for abdominal fluid collection drain placement at the request of Dr Gordy Savers  Supervising Physician: Malachy Moan  Patient Status: [facility] - In-pt  History of Present Illness: Gary Weaver is a 52 y.o. male   Cholecystectomy last week 10/31 Increasing discomfort-- abd pain  CT yesterday: IMPRESSION: 1. Interval cholecystectomy. Large perihepatic fluid collection along the dome of the liver extending anteriorly along the medial segment of the left hepatic lobe concerning for a biloma. Secondarily infected fluid collection cannot be excluded.  Admitted last pm-- now request for drain placement Dr Fredia Sorrow reviewed imaging and approves procedure    Past Medical History:  Diagnosis Date  . Anxiety   . Arrhythmia   . Arthritis   . Asthma   . GERD (gastroesophageal reflux disease)   . Gluten intolerance   . Headache   . Hearing loss   . Hyperlipidemia   . Hypertension   . Memory loss   . Pancreatitis   . Pneumonia    hx of   . Vision abnormalities     Past Surgical History:  Procedure Laterality Date  . COLONOSCOPY    . LAPAROSCOPIC CHOLECYSTECTOMY SINGLE SITE WITH INTRAOPERATIVE CHOLANGIOGRAM N/A 06/13/2018   Procedure: LAPAROSCOPIC CHOLECYSTECTOMY SINGLE SITE WITH INTRAOPERATIVE CHOLANGIOGRAM ERAS PATHWAY;  Surgeon: Karie Soda, MD;  Location: [facility] ORS;  Service: General;  Laterality: N/A;  . UMBILICAL HERNIA REPAIR N/A 06/13/2018   Procedure: LAPAROSCOPIC POSSIBLE OPEN UMBILICAL HERNIA;  Surgeon: Karie Soda, MD;  Location: [facility] ORS;  Service: General;  Laterality: N/A;  . UPPER GASTROINTESTINAL ENDOSCOPY    . WISDOM TOOTH EXTRACTION      Allergies: Eggs or egg-derived products; Gluten meal; and Latex  Medications: Prior to Admission medications   Medication Sig Start Date End Date Taking? Authorizing Provider  albuterol (PROVENTIL HFA;VENTOLIN HFA) 108 (90 Base) MCG/ACT inhaler Inhale 2  puffs into the lungs every 6 (six) hours as needed for wheezing or shortness of breath.   Yes [provider]  cholecalciferol (VITAMIN D) 1000 units tablet Take 4,000 Units by mouth daily.   Yes [provider]  cyclobenzaprine (FLEXERIL) 5 MG tablet Take 1 tablet (5 mg total) by mouth at bedtime. Patient taking differently: Take 5 mg by mouth 3 (three) times daily as needed for muscle spasms.  11/14/16  Yes Sater, Pearletha Furl, MD  fexofenadine (ALLEGRA) 180 MG tablet Take 180 mg by mouth daily.   Yes [provider]  gabapentin (NEURONTIN) 300 MG capsule Take 300-2,100 mg by mouth 3 (three) times daily as needed (pain).    Yes [provider]  hydrOXYzine (VISTARIL) 25 MG capsule Take 25 mg by mouth daily.    Yes [provider]  metoCLOPramide (REGLAN) 5 MG tablet Take 5 mg by mouth every 6 (six) hours as needed for nausea or vomiting.    Yes [provider]  omeprazole (PRILOSEC) 40 MG capsule Take 40 mg by mouth daily.   Yes [provider]  ondansetron (ZOFRAN ODT) 4 MG disintegrating tablet Take 1 tablet (4 mg total) by mouth every 8 (eight) hours as needed for nausea or vomiting. Patient taking differently: Take 4 mg by mouth at bedtime.  06/09/18  Yes Upstill, Melvenia Beam, PA-C  traMADol (ULTRAM) 50 MG tablet Take 1-2 tablets (50-100 mg total) by mouth every 6 (six) hours as needed for severe pain. 06/13/18  Yes Karie Soda, MD  valsartan (DIOVAN) 160 MG tablet Take 80 mg by mouth  daily.   Yes [provider]     Family History  Problem Relation Age of Onset  . Hypertension Father   . Prostate cancer Father   . Colon polyps Father   . Bladder Cancer Mother   . Colon cancer Maternal Grandmother   . Esophageal cancer Neg Hx   . Rectal cancer Neg Hx   . Stomach cancer Neg Hx   . Pancreatic cancer Neg Hx     Social History   Socioeconomic History  . Marital status: Married    Spouse name: Not on file  . Number of  children: 3  . Years of education: Not on file  . Highest education level: Not on file  Occupational History  . Not on file  Social Needs  . Financial resource strain: Not on file  . Food insecurity:    Worry: Not on file    Inability: Not on file  . Transportation needs:    Medical: Not on file    Non-medical: Not on file  Tobacco Use  . Smoking status: Never Smoker  . Smokeless tobacco: Former Neurosurgeon    Types: Chew  Substance and Sexual Activity  . Alcohol use: Not Currently  . Drug use: No  . Sexual activity: Not on file  Lifestyle  . Physical activity:    Days per week: Not on file    Minutes per session: Not on file  . Stress: Not on file  Relationships  . Social connections:    Talks on phone: Not on file    Gets together: Not on file    Attends religious service: Not on file    Active member of club or organization: Not on file    Attends meetings of clubs or organizations: Not on file    Relationship status: Not on file  Other Topics Concern  . Not on file  Social History Narrative  . Not on file     Review of Systems: A 12 point ROS discussed and pertinent positives are indicated in the HPI above.  All other systems are negative.  Review of Systems  Constitutional: Positive for activity change. Negative for fatigue and fever.  Respiratory: Negative for shortness of breath.   Cardiovascular: Negative for chest pain.  Gastrointestinal: Positive for abdominal pain.  Psychiatric/Behavioral: Negative for behavioral problems and confusion.    Vital Signs: BP 118/83 (BP Location: Left Arm)   Pulse 72   Temp 98.9 F (37.2 C) (Oral)   Resp 16   Ht 5\' 6"  (1.676 m)   Wt 192 lb (87.1 kg)   SpO2 99%   BMI 30.99 kg/m   Physical Exam  Constitutional: He is oriented to person, place, and time.  Cardiovascular: Normal rate, regular rhythm and normal heart sounds.  Pulmonary/Chest: Effort normal and breath sounds normal.  Abdominal: Soft. There is tenderness.    Musculoskeletal: Normal range of motion.  Neurological: He is alert and oriented to person, place, and time.  Skin: Skin is warm and dry.  Psychiatric: He has a normal mood and affect. His behavior is normal. Judgment and thought content normal.  Vitals reviewed.   Imaging: Dg Cholangiogram Operative  Result Date: 06/13/2018 CLINICAL DATA:  Cholecystectomy EXAM: INTRAOPERATIVE CHOLANGIOGRAM TECHNIQUE: Cholangiographic images from the C-arm fluoroscopic device were submitted for interpretation post-operatively. Please see the procedural report for the amount of contrast and the fluoroscopy time utilized. COMPARISON:  CT 06/08/2018 FINDINGS: Diffusely narrow extrahepatic biliary tree without mucosal irregularity or obstruction, presumably physiologic.  No persistent filling defects in the common duct. Intrahepatic ducts are incompletely visualized, appearing decompressed centrally. Contrast passes into the duodenum. : Negative for retained common duct stone. Electronically Signed   By: Corlis Leak M.D.   On: 06/13/2018 14:41   Ct Abdomen Pelvis W Contrast  Result Date: 06/20/2018 CLINICAL DATA:  Right upper quadrant and mid abdominal pain. Recent cholecystectomy 06/13/2018 EXAM: CT ABDOMEN AND PELVIS WITH CONTRAST TECHNIQUE: Multidetector CT imaging of the abdomen and pelvis was performed using the standard protocol following bolus administration of intravenous contrast. CONTRAST:  ISOVUE-300 IOPAMIDOL (ISOVUE-300) INJECTION 61% COMPARISON:  06/08/2018 FINDINGS: Lower chest: Trace right pleural effusion with atelectasis. Hepatobiliary: No focal hepatic mass. Interval cholecystectomy. Large perihepatic fluid collection along the dome of the liver extending anteriorly along the medial segment of the left hepatic lobe measuring approximately 3.6 x 8.6 x 9 cm. Secondarily infected fluid collection cannot be excluded. Pancreas: Unremarkable. No pancreatic ductal dilatation or surrounding inflammatory  changes. Spleen: Normal in size without focal abnormality. Adrenals/Urinary Tract: Adrenal glands are unremarkable. Kidneys are normal, without renal calculi, focal lesion, or hydronephrosis. Bladder is unremarkable. Stomach/Bowel: Stomach is within normal limits. No evidence of bowel wall thickening, distention, or inflammatory changes. Large amount of stool throughout the colon. Vascular/Lymphatic: No significant vascular findings are present. No enlarged abdominal or pelvic lymph nodes. Reproductive: Prostate is unremarkable. Other: No abdominal wall hernia or abnormality. No abdominopelvic ascites. Musculoskeletal: No acute or significant osseous findings. IMPRESSION: 1. Interval cholecystectomy. Large perihepatic fluid collection along the dome of the liver extending anteriorly along the medial segment of the left hepatic lobe concerning for a biloma. Secondarily infected fluid collection cannot be excluded. Electronically Signed   By: Elige Ko   On: 06/20/2018 14:40   Ct Abdomen Pelvis W Contrast  Result Date: 06/09/2018 CLINICAL DATA:  52 year old male with abdominal pain and nausea. EXAM: CT ABDOMEN AND PELVIS WITH CONTRAST TECHNIQUE: Multidetector CT imaging of the abdomen and pelvis was performed using the standard protocol following bolus administration of intravenous contrast. CONTRAST:  ISOVUE-300 IOPAMIDOL (ISOVUE-300) INJECTION 61% COMPARISON:  CT of the abdomen pelvis dated 04/04/2017 and MRI dated 05/16/2017 FINDINGS: Lower chest: The visualized lung bases are clear. No intra-abdominal free air or free fluid. Hepatobiliary: Subcentimeter hepatic hypodense lesion is too small to characterize but likely a cyst. The liver is otherwise unremarkable. No intrahepatic biliary ductal dilatation. The gallbladder is unremarkable. Pancreas: Unremarkable. No pancreatic ductal dilatation or surrounding inflammatory changes. Spleen: Normal in size without focal abnormality. Adrenals/Urinary Tract:  Adrenal glands are unremarkable. Kidneys are normal, without renal calculi, focal lesion, or hydronephrosis. Bladder is unremarkable. Stomach/Bowel: Stomach is within normal limits. Appendix appears normal. No evidence of bowel wall thickening, distention, or inflammatory changes. Vascular/Lymphatic: No significant vascular findings are present. No enlarged abdominal or pelvic lymph nodes. Reproductive: The prostate and seminal vesicles are grossly unremarkable. Other: Small fat containing umbilical hernia. Musculoskeletal: No acute or significant osseous findings. IMPRESSION: No acute intra-abdominal or pelvic pathology. Electronically Signed   By: Elgie Collard M.D.   On: 06/09/2018 00:02    Labs:  CBC: Recent Labs    06/08/18 2007 06/21/18 0212  WBC 6.6 12.7*  HGB 14.4 11.4*  HCT 42.9 34.5*  PLT 278 338    COAGS: Recent Labs    06/21/18 0212  INR 1.13    BMP: Recent Labs    06/08/18 2007 06/21/18 0212  NA 142 131*  K 4.3 3.8  CL 106 98  CO2  26 24  GLUCOSE 95 125*  BUN 13 10  CALCIUM 9.6 8.7*  CREATININE 1.19 1.27*  GFRNONAA >60 >60  GFRAA >60 >60    LIVER FUNCTION TESTS: Recent Labs    06/08/18 2007 06/21/18 0212  BILITOT 1.4* 1.7*  AST 19 26  ALT 21 32  ALKPHOS 47 114  PROT 7.5 6.1*  ALBUMIN 4.7 3.0*    TUMOR MARKERS: No results for input(s): AFPTM, CEA, CA199, CHROMGRNA in the last 8760 hours.  Assessment and Plan:  Cholecystectomy 10/31 Now with abd pain Imaging shows perihepatic collection Scheduled for drain placement Risks and benefits discussed with the patient including bleeding, infection, damage to adjacent structures, bowel perforation/fistula connection, and sepsis.  All of the patient's questions were answered, patient is agreeable to proceed. Consent signed and in chart.   Thank you for this interesting consult.  I greatly enjoyed meeting Yulian Gosney and look forward to participating in their care.  A copy of this report  was sent to the requesting provider on this date.  Electronically Signed: Robet Leu, PA-C 06/21/2018, 8:18 AM   I spent a total of 40 Minutes    in face to face in clinical consultation, greater than 50% of which was counseling/coordinating care for abd fluid collection drain placement

## 2018-06-21 NOTE — Progress Notes (Signed)
[place] [id number] [birth year]  CARE TEAM:  PCP: Lillard Anes, MD  Outpatient Care Team: Patient Care Team: Lillard Anes, MD as PCP - General (Family Medicine) Michael Boston, MD as Consulting Physician (General Surgery) [place], Kirke Corin, MD as Consulting Physician (Gastroenterology) Marijo File Revonda Standard., MD as Consulting Physician (Cardiology)  Inpatient Treatment Team: Treatment Team: Attending Provider: Michael Boston, MD; Registered Nurse: Forestine Chute, RN; Technician: Rolan Bucco, NT; Technician: Gasper Sells, [place]   Problem List:   Principal Problem:   Intra-abdominal fluid collection Active Problems:   Radiculopathy, lumbar region   Multiple joint pain   Insomnia   Chronic pancreatitis ([place])   Acute on chronic cholecystitis s/p lap cholecystectomy 73/53/2992   Umbilical hernia s/p primary repair 06/13/2018      * No surgery found [facility] Stay = 0 days  Assessment  Right upper quadrant abdominal fluid collection.  Seroma versus biloma.  Plan:  Percutaneous drainage of fluid collection.  Leave drain in place.  Seems like a decent window to do under ultrasound.  I discussed with Dr. Kathlene Cote yesterday.  He will relay to Dr. Laurence Ferrari who is on for interventional today.  If fluid collection seems serous and patient is feeling much better, can discharge later in the day.  If there is concern for biloma, anticipate gastrology consultation for probably ERCP/stenting for internal protection and external percutaneous drainage.    Pain control.  -VTE prophylaxis- SCDs, etc -mobilize as tolerated to help recovery  20 minutes spent in review, evaluation, examination, counseling, and coordination of care.  More than 50% of that time was spent in counseling.  06/21/2018    Subjective: (Chief complaint)  Pain better controlled.  Decreased appetite but no nausea or vomiting.    Resting in bed comfortably.   Was able to walk in the hallways last night  Objective:  Vital signs:  Vitals:   06/20/18 1700 06/20/18 2136 06/21/18 0159 06/21/18 0458  BP: 116/79 118/88  118/83  Pulse: 99 91  72  Resp:  20  16  Temp:  (!) 100.4 F (38 C) 99.6 F (37.6 C) 98.9 F (37.2 C)  TempSrc:  Oral Oral Oral  SpO2:  95%  99%  Weight:      Height:        Last BM Date: 06/19/18  Intake/Output   Yesterday:  11/07 0701 - 11/08 0700 In: 1095.1 [P.O.:480; I.V.:615.1] Out: 900 [Urine:900] This shift:  Total I/O In: 1095.1 [P.O.:480; I.V.:615.1] Out: 900 [Urine:900]  Bowel function:  Flatus: YES  BM:  No  Drain: (No drain)   Physical Exam:  General: Pt awake/alert/oriented x4 in mild acute distress Eyes: PERRL, normal EOM.  Sclera clear.  No icterus Neuro: CN II-XII intact w/o focal sensory/motor deficits. Lymph: No head/neck/groin lymphadenopathy Psych:  No delerium/psychosis/paranoia HENT: Normocephalic, Mucus membranes moist.  No thrush Neck: Supple, No tracheal deviation Chest: No chest wall pain w good excursion CV:  Pulses intact.  Regular rhythm MS: Normal AROM mjr joints.  No obvious deformity  Abdomen: Soft.  Mildy distended.  Tenderness at Epigastric and right upper quadrant regions..  No evidence of peritonitis.  No incarcerated hernias.  Ext:   No deformity.  No mjr edema.  No cyanosis Skin: No petechiae / purpura  Results:   Labs: Results for orders placed or performed during the hospital encounter of 06/20/18 (from the past 48 hour(s))  CBC     Status: Abnormal  Collection Time: 06/21/18  2:12 AM  Result Value Ref Range   WBC 12.7 (H) 4.0 - 10.5 K/uL   RBC 3.92 (L) 4.22 - 5.81 MIL/uL   Hemoglobin 11.4 (L) 13.0 - 17.0 g/dL   HCT 34.5 (L) 39.0 - 52.0 %   MCV 88.0 80.0 - 100.0 fL   MCH 29.1 26.0 - 34.0 pg   MCHC 33.0 30.0 - 36.0 g/dL   RDW 12.3 11.5 - 15.5 %   Platelets 338 150 - 400 K/uL   nRBC 0.0 0.0 - 0.2 %    Comment: Performed at [facility],  [place] [address], [place], [place] [address]  Comprehensive metabolic panel     Status: Abnormal   Collection Time: 06/21/18  2:12 AM  Result Value Ref Range   Sodium 131 (L) 135 - 145 mmol/L   Potassium 3.8 3.5 - 5.1 mmol/L   Chloride 98 98 - 111 mmol/L   CO2 24 22 - 32 mmol/L   Glucose, Bld 125 (H) 70 - 99 mg/dL   BUN 10 6 - 20 mg/dL   Creatinine, Ser 1.27 (H) 0.61 - 1.24 mg/dL   Calcium 8.7 (L) 8.9 - 10.3 mg/dL   Total Protein 6.1 (L) 6.5 - 8.1 g/dL   Albumin 3.0 (L) 3.5 - 5.0 g/dL   AST 26 15 - 41 U/L   ALT 32 0 - 44 U/L   Alkaline Phosphatase 114 38 - 126 U/L   Total Bilirubin 1.7 (H) 0.3 - 1.2 mg/dL   GFR calc non Af Amer >60 >60 mL/min   GFR calc Af Amer >60 >60 mL/min    Comment: (NOTE) The eGFR has been calculated using the CKD EPI equation. This calculation has not been validated in all clinical situations. eGFR's persistently <60 mL/min signify possible Chronic Kidney Disease.    Anion gap 9 5 - 15    Comment: Performed at [facility] [address], [place], [place] [address]  Protime-INR     Status: None   Collection Time: 06/21/18  2:12 AM  Result Value Ref Range   Prothrombin Time 14.4 11.4 - 15.2 seconds   INR 1.13     Comment: Performed at [facility] [address] [place], [place] [address]    Imaging / Studies: Ct Abdomen Pelvis W Contrast  Result Date: 06/20/2018 CLINICAL DATA:  Right upper quadrant and mid abdominal pain. Recent cholecystectomy 06/13/2018 EXAM: CT ABDOMEN AND PELVIS WITH CONTRAST TECHNIQUE: Multidetector CT imaging of the abdomen and pelvis was performed using the standard protocol following bolus administration of intravenous contrast. CONTRAST:  160m ISOVUE-300 IOPAMIDOL (ISOVUE-300) INJECTION 61% COMPARISON:  06/08/2018 FINDINGS: Lower chest: Trace right pleural effusion with atelectasis. Hepatobiliary: No focal hepatic mass. Interval cholecystectomy. Large perihepatic fluid collection along the dome of the liver extending  anteriorly along the medial segment of the left hepatic lobe measuring approximately 3.6 x 8.6 x 9 cm. Secondarily infected fluid collection cannot be excluded. Pancreas: Unremarkable. No pancreatic ductal dilatation or surrounding inflammatory changes. Spleen: Normal in size without focal abnormality. Adrenals/Urinary Tract: Adrenal glands are unremarkable. Kidneys are normal, without renal calculi, focal lesion, or hydronephrosis. Bladder is unremarkable. Stomach/Bowel: Stomach is within normal limits. No evidence of bowel wall thickening, distention, or inflammatory changes. Large amount of stool throughout the colon. Vascular/Lymphatic: No significant vascular findings are present. No enlarged abdominal or pelvic lymph nodes. Reproductive: Prostate is unremarkable. Other: No abdominal wall hernia or abnormality. No abdominopelvic ascites. Musculoskeletal: No  acute or significant osseous findings. IMPRESSION: 1. Interval cholecystectomy. Large perihepatic fluid collection along the dome of the liver extending anteriorly along the medial segment of the left hepatic lobe concerning for a biloma. Secondarily infected fluid collection cannot be excluded. Electronically Signed   By: Kathreen Devoid   On: 06/20/2018 14:40    Medications / Allergies: per chart  Antibiotics: Anti-infectives (From admission, onward)   None        Note: Portions of this report may have been transcribed using voice recognition software. Every effort was made to ensure accuracy; however, inadvertent computerized transcription errors may be present.   Any transcriptional errors that result from this process are unintentional.     Adin Hector, MD, FACS, MASCRS Gastrointestinal and Minimally Invasive Surgery    [address], [place], [place] [address] [phone] Main / Paging [phone] Fax

## 2018-06-21 NOTE — Plan of Care (Signed)
  Problem: Education: Goal: Knowledge of General Education information will improve Description Including pain rating scale, medication(s)/side effects and non-pharmacologic comfort measures Outcome: Progressing   Problem: Health Behavior/Discharge Planning: Goal: Ability to manage health-related needs will improve Outcome: Progressing   Problem: Clinical Measurements: Goal: Ability to maintain clinical measurements within normal limits will improve Outcome: Progressing Goal: Will remain free from infection Outcome: Progressing Goal: Respiratory complications will improve Outcome: Progressing Goal: Cardiovascular complication will be avoided Outcome: Progressing   Problem: Activity: Goal: Risk for activity intolerance will decrease Outcome: Progressing   Problem: Nutrition: Goal: Adequate nutrition will be maintained Outcome: Progressing   Problem: Coping: Goal: Level of anxiety will decrease Outcome: Progressing   Problem: Elimination: Goal: Will not experience complications related to bowel motility Outcome: Progressing Goal: Will not experience complications related to urinary retention Outcome: Progressing   Problem: Pain Managment: Goal: General experience of comfort will improve Outcome: Progressing   Problem: Safety: Goal: Ability to remain free from injury will improve Outcome: Progressing   Problem: Skin Integrity: Goal: Risk for impaired skin integrity will decrease Outcome: Progressing   Hillel Card C Neya Creegan, RN 06/21/2018  

## 2018-06-21 NOTE — H&P (Signed)
Gary Weaver  [birth year] [id number]  CARE TEAM:  PCP: Lillard Anes, MD  Outpatient Care Team: Patient Care Team: Lillard Anes, MD as PCP - General (Family Medicine) Michael Boston, MD as Consulting Physician (General Surgery) Wink, Kirke Corin, MD as Consulting Physician (Gastroenterology) Marijo File Revonda Standard., MD as Consulting Physician (Cardiology)  Inpatient Treatment Team: Treatment Team: Attending Provider: Michael Boston, MD; Registered Nurse: Forestine Chute, RN; Technician: Rolan Bucco, NT; Technician: Gasper Sells, [place]; Rounding Team: Dorthy Cooler Radiology, MD; Registered Nurse: Debby Bud, RN; Respiratory Therapist: Philomena Doheny, RRT    This patient is a 52 y.o.male who presents today for surgical evaluation at the request of self.   Chief complaint / Reason for evaluation: Abdominal pain  Patient with episodes of abdominal pain.  Found to have biliary colic and chronic cholecystitis.  Worsening symptoms.  Surgery moved up sooner.  Underwent urgent cholecystectomy last week.  Was able to go home same day.  Has had some increasing discomfort.  Decreased appetite.  No real nausea or vomiting.  Moving bowels.  Struggles with fair pain control and chronic low back and lumbar pain.  Because of persistent symptoms, laboratory blood work done.  White count not significantly elevated.  Bilirubin and alkaline phosphatase borderline elevated.  CT scan done today.  Large simple fluid collection on the dome of the liver noted.  Not really at the gallbladder fossa.  Not classic for an abscess.  However causing some pressure on the liver.  Most likely explains his pain and discomfort.  We offered percutaneous drainage.  Interventional radiology has no time to do this until next week.  Due to the patient's discomfort and to rule out biloma, will urgently admit in the hopes of being able to be done sooner.  Discussed with  interventional radiology with Dr. Kathlene Cote.  He believes there is a better opportunity to do this at [facility].  He will discuss with his colleague, Dr. Jacqulynn Cadet   Assessment  Gary Weaver  52 y.o. 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  Plan:  Admit.  IV fluids.  Nausea control.  Pain control.  Percutaneous drainage of fluid collection.  Looks like there is a right subcostal window.  Probably can do just under ultrasound.  Discussed with Dr. Kathlene Cote with interventional radiology.  He will discuss with his colleague, Dr. Vivia Budge, who would be able to do the procedure at [facility] tomorrow.  Repeat lab work in the morning.  Hold off on any antibiotics for now unless abscess drained.  If this is consistent with a biloma fluid collection, most likely will need to have gastroenterology consulted for ERCP and stenting.  This would allow internal/external drainage allow a biloma to close.  VTE prophylaxis- SCDs, etc. hold off on Lovenox until procedure done.  Patient up and mobile, so I do not think it is a very high DVT risk  Mobilize as tolerated to help recovery  45 minutes spent in review, evaluation, examination, counseling, and coordination of care.  More than 50% of that time was  spent in counseling.  Adin Hector, MD, FACS, MASCRS Gastrointestinal and Minimally Invasive Surgery    [address], [place], [place] [address] [phone] Main / Paging [phone] Fax   06/21/2018      Past Medical History:  Diagnosis  Date  . Anxiety   . Arrhythmia   . Arthritis   . Asthma   . GERD (gastroesophageal reflux disease)   . Gluten intolerance   . Headache   . Hearing loss   . Hyperlipidemia   . Hypertension   . Memory loss   . Pancreatitis   . Pneumonia    hx of   . Vision abnormalities     Past Surgical History:  Procedure Laterality Date  . COLONOSCOPY    . LAPAROSCOPIC CHOLECYSTECTOMY SINGLE SITE WITH INTRAOPERATIVE CHOLANGIOGRAM N/A 06/13/2018   Procedure: LAPAROSCOPIC CHOLECYSTECTOMY SINGLE SITE WITH INTRAOPERATIVE CHOLANGIOGRAM ERAS PATHWAY;  Surgeon: Michael Boston, MD;  Location: [facility];  Service: General;  Laterality: N/A;  . UMBILICAL HERNIA REPAIR N/A 06/13/2018   Procedure: LAPAROSCOPIC POSSIBLE OPEN UMBILICAL HERNIA;  Surgeon: Michael Boston, MD;  Location: [facility];  Service: General;  Laterality: N/A;  . UPPER GASTROINTESTINAL ENDOSCOPY    . WISDOM TOOTH EXTRACTION      Social History   Socioeconomic History  . Marital status: Married    Spouse name: Not on file  . Number of children: 3  . Years of education: Not on file  . Highest education level: Not on file  Occupational History  . Not on file  Social Needs  . Financial resource strain: Not on file  . Food insecurity:    Worry: Not on file    Inability: Not on file  . Transportation needs:    Medical: Not on file    Non-medical: Not on file  Tobacco Use  . Smoking status: Never Smoker  . Smokeless tobacco: Former Systems developer    Types: Chew  Substance and Sexual Activity  . Alcohol use: Not Currently  . Drug use: No  . Sexual activity: Not on file  Lifestyle  . Physical activity:    Days per week: Not on file    Minutes per session: Not on file  . Stress: Not on file  Relationships  . Social connections:    Talks on phone: Not on file    Gets together: Not on file    Attends religious service: Not on file    Active member of club or organization: Not on file    Attends meetings of clubs or organizations: Not on  file    Relationship status: Not on file  . Intimate partner violence:    Fear of current or ex partner: Not on file    Emotionally abused: Not on file    Physically abused: Not on file    Forced sexual activity: Not on file  Other Topics Concern  . Not on file  Social History Narrative  . Not on file    Family History  Problem Relation Age of Onset  . Hypertension Father   . Prostate cancer Father   . Colon polyps Father   . Bladder Cancer Mother   . Colon cancer Maternal Grandmother   . Esophageal cancer Neg Hx   . Rectal cancer Neg Hx   . Stomach cancer Neg Hx   . Pancreatic cancer Neg Hx     Current Facility-Administered Medications  Medication Dose Route Frequency Provider Last Rate Last Dose  . acetaminophen (  TYLENOL) tablet 650 mg  650 mg Oral Q6H PRN Michael Boston, MD       Or  . acetaminophen (TYLENOL) suppository 650 mg  650 mg Rectal Q6H PRN Michael Boston, MD      . albuterol (PROVENTIL) (2.5 MG/3ML) 0.083% nebulizer solution 3 mL  3 mL Inhalation Q6H PRN Michael Boston, MD      . cholecalciferol (VITAMIN D3) tablet 4,000 Units  4,000 Units Oral Daily Michael Boston, MD      . diphenhydrAMINE (BENADRYL) 12.5 MG/5ML elixir 12.5 mg  12.5 mg Oral Q6H PRN Michael Boston, MD       Or  . diphenhydrAMINE (BENADRYL) injection 12.5 mg  12.5 mg Intravenous Q6H PRN Michael Boston, MD      . gabapentin (NEURONTIN) capsule 300 mg  300 mg Oral Ardeen Fillers, MD   300 mg at 06/20/18 2150  . hydrALAZINE (APRESOLINE) injection 5-10 mg  5-10 mg Intravenous Q4H PRN Michael Boston, MD      . HYDROmorphone (DILAUDID) injection 0.5-2 mg  0.5-2 mg Intravenous Q1H PRN Michael Boston, MD   0.5 mg at 06/21/18 1610  . hydrOXYzine (ATARAX/VISTARIL) tablet 25 mg  25 mg Oral Daily Michael Boston, MD      . irbesartan (AVAPRO) tablet 150 mg  150 mg Oral Daily Michael Boston, MD      . lactated ringers bolus 1,000 mL  1,000 mL Intravenous TID PRN Michael Boston, MD      . lactated ringers bolus 1,000  mL  1,000 mL Intravenous Once Michael Boston, MD 983.6 mL/hr at 06/21/18 0649 1,000 mL at 06/21/18 0649  . lactated ringers infusion   Intravenous Continuous Michael Boston, MD 100 mL/hr at 06/21/18 0300    . lip balm (CARMEX) ointment 1 application  1 application Topical BID Michael Boston, MD   1 application at 96/04/54 2002  . loratadine (CLARITIN) tablet 10 mg  10 mg Oral Daily Michael Boston, MD      . LORazepam (ATIVAN) injection 0.5-1 mg  0.5-1 mg Intravenous Q8H PRN Michael Boston, MD      . magic mouthwash  15 mL Oral QID PRN Michael Boston, MD      . methocarbamol (ROBAXIN) 1,000 mg in dextrose 5 % 50 mL IVPB  1,000 mg Intravenous Q6H PRN Michael Boston, MD      . metoCLOPramide (REGLAN) tablet 5 mg  5 mg Oral Q6H PRN Kinsinger, Arta Bruce, MD   5 mg at 06/20/18 2150  . metoprolol tartrate (LOPRESSOR) injection 5 mg  5 mg Intravenous Q6H PRN Michael Boston, MD      . ondansetron (ZOFRAN-ODT) disintegrating tablet 4 mg  4 mg Oral Q6H PRN Michael Boston, MD       Or  . ondansetron [facility]) injection 4 mg  4 mg Intravenous Q6H PRN Michael Boston, MD      . oxyCODONE (Oxy IR/ROXICODONE) immediate release tablet 5-10 mg  5-10 mg Oral Q4H PRN Michael Boston, MD   10 mg at 06/21/18 0504  . pantoprazole (PROTONIX) EC tablet 40 mg  40 mg Oral Daily Michael Boston, MD      . prochlorperazine (COMPAZINE) injection 5-10 mg  5-10 mg Intravenous Q4H PRN Michael Boston, MD      . simethicone (MYLICON) chewable tablet 40 mg  40 mg Oral Q6H PRN Michael Boston, MD      . traMADol Veatrice Bourbon) tablet 50-100 mg  50-100 mg Oral Q6H PRN Michael Boston, MD  Allergies  Allergen Reactions  . Eggs Or Egg-Derived Products Diarrhea  . Gluten Meal Nausea And Vomiting  . Latex Rash    Occurs after long use of latex    ROS:   All other systems reviewed & are negative except per HPI or as noted below: Constitutional:  No fevers, chills, sweats.  Weight stable Eyes:  No vision changes, No discharge HENT:  No sore  throats, nasal drainage Lymph: No neck swelling, No bruising easily Pulmonary:  No cough, productive sputum CV: No orthopnea, PND  Patient walks 30 minutes for about 1 miles without difficulty.  No exertional chest/neck/shoulder/arm pain. GI: No personal nor family history of GI/colon cancer, inflammatory bowel disease, irritable bowel syndrome, allergy such as Celiac Sprue, dietary/dairy problems, colitis, ulcers nor gastritis.  No recent sick contacts/gastroenteritis.  No travel outside the country.  No changes in diet. Renal: No UTIs, No hematuria Genital:  No drainage, bleeding, masses Musculoskeletal: No severe joint pain.  Good ROM major joints Skin:  No sores or lesions.  No rashes Heme/Lymph:  No easy bleeding.  No swollen lymph nodes Neuro: No focal weakness/numbness.  No seizures Psych: No suicidal ideation.  No hallucinations  BP 118/83 (BP Location: Left Arm)   Pulse 72   Temp 98.9 F (37.2 C) (Oral)   Resp 16   Ht 5' 6"  (1.676 m)   Wt 87.1 kg   SpO2 99%   BMI 30.99 kg/m   Physical Exam: General: Pt awake/alert/oriented x4 in mild major acute distress Eyes: PERRL, normal EOM. Sclera nonicteric Neuro: CN II-XII intact w/o focal sensory/motor deficits. Lymph: No head/neck/groin lymphadenopathy Psych:  No delerium/psychosis/paranoia HENT: Normocephalic, Mucus membranes moist.  No thrush Neck: Supple, No tracheal deviation Chest: No pain.  Good respiratory excursion. CV:  Pulses intact.  Regular rhythm Abdomen: Soft obese & mildly distended.  Discomfort right upper quadrant epigastric region.  No major guarding.  No peritonitis.  The rest of the abdomen while distended is nontender.  No incarcerated hernias. Gen:  No inguinal hernias.  No inguinal lymphadenopathy.   Ext:  SCDs BLE.  No significant edema.  No cyanosis Skin: No petechiae / purpurea.  No major sores Musculoskeletal: No severe joint pain.  Good ROM major joints   Results:   Labs: Results for orders  placed or performed during the hospital encounter of 06/20/18 (from the past 48 hour(s))  CBC     Status: Abnormal   Collection Time: 06/21/18  2:12 AM  Result Value Ref Range   WBC 12.7 (H) 4.0 - 10.5 K/uL   RBC 3.92 (L) 4.22 - 5.81 MIL/uL   Hemoglobin 11.4 (L) 13.0 - 17.0 g/dL   HCT 34.5 (L) 39.0 - 52.0 %   MCV 88.0 80.0 - 100.0 fL   MCH 29.1 26.0 - 34.0 pg   MCHC 33.0 30.0 - 36.0 g/dL   RDW 12.3 11.5 - 15.5 %   Platelets 338 150 - 400 K/uL   nRBC 0.0 0.0 - 0.2 %    Comment: Performed at [facility], [place] [address], [place], [place] [address]  Comprehensive metabolic panel     Status: Abnormal   Collection Time: 06/21/18  2:12 AM  Result Value Ref Range   Sodium 131 (L) 135 - 145 mmol/L   Potassium 3.8 3.5 - 5.1 mmol/L   Chloride 98 98 - 111 mmol/L   CO2 24 22 - 32 mmol/L   Glucose, Bld 125 (H) 70 - 99 mg/dL   BUN  10 6 - 20 mg/dL   Creatinine, Ser 1.27 (H) 0.61 - 1.24 mg/dL   Calcium 8.7 (L) 8.9 - 10.3 mg/dL   Total Protein 6.1 (L) 6.5 - 8.1 g/dL   Albumin 3.0 (L) 3.5 - 5.0 g/dL   AST 26 15 - 41 U/L   ALT 32 0 - 44 U/L   Alkaline Phosphatase 114 38 - 126 U/L   Total Bilirubin 1.7 (H) 0.3 - 1.2 mg/dL   GFR calc non Af Amer >60 >60 mL/min   GFR calc Af Amer >60 >60 mL/min    Comment: (NOTE) The eGFR has been calculated using the CKD EPI equation. This calculation has not been validated in all clinical situations. eGFR's persistently <60 mL/min signify possible Chronic Kidney Disease.    Anion gap 9 5 - 15    Comment: Performed at [facility] [address] [place], [place] [address]  Protime-INR     Status: None   Collection Time: 06/21/18  2:12 AM  Result Value Ref Range   Prothrombin Time 14.4 11.4 - 15.2 seconds   INR 1.13     Comment: Performed at [facility] [address] [place], [place] [address]    Imaging / Studies: Dg Cholangiogram Operative  Result Date: 06/13/2018 CLINICAL DATA:  Cholecystectomy EXAM: INTRAOPERATIVE  CHOLANGIOGRAM TECHNIQUE: Cholangiographic images from the C-arm fluoroscopic device were submitted for interpretation post-operatively. Please see the procedural report for the amount of contrast and the fluoroscopy time utilized. COMPARISON:  CT 06/08/2018 FINDINGS: Diffusely narrow extrahepatic biliary tree without mucosal irregularity or obstruction, presumably physiologic. No persistent filling defects in the common duct. Intrahepatic ducts are incompletely visualized, appearing decompressed centrally. Contrast passes into the duodenum. : Negative for retained common duct stone. Electronically Signed   By: Lucrezia Europe M.D.   On: 06/13/2018 14:41   Ct Abdomen Pelvis W Contrast  Result Date: 06/20/2018 CLINICAL DATA:  Right upper quadrant and mid abdominal pain. Recent cholecystectomy 06/13/2018 EXAM: CT ABDOMEN AND PELVIS WITH CONTRAST TECHNIQUE: Multidetector CT imaging of the abdomen and pelvis was performed using the standard protocol following bolus administration of intravenous contrast. CONTRAST:  129m ISOVUE-300 IOPAMIDOL (ISOVUE-300) INJECTION 61% COMPARISON:  06/08/2018 FINDINGS: Lower chest: Trace right pleural effusion with atelectasis. Hepatobiliary: No focal hepatic mass. Interval cholecystectomy. Large perihepatic fluid collection along the dome of the liver extending anteriorly along the medial segment of the left hepatic lobe measuring approximately 3.6 x 8.6 x 9 cm. Secondarily infected fluid collection cannot be excluded. Pancreas: Unremarkable. No pancreatic ductal dilatation or surrounding inflammatory changes. Spleen: Normal in size without focal abnormality. Adrenals/Urinary Tract: Adrenal glands are unremarkable. Kidneys are normal, without renal calculi, focal lesion, or hydronephrosis. Bladder is unremarkable. Stomach/Bowel: Stomach is within normal limits. No evidence of bowel wall thickening, distention, or inflammatory changes. Large amount of stool throughout the colon.  Vascular/Lymphatic: No significant vascular findings are present. No enlarged abdominal or pelvic lymph nodes. Reproductive: Prostate is unremarkable. Other: No abdominal wall hernia or abnormality. No abdominopelvic ascites. Musculoskeletal: No acute or significant osseous findings. IMPRESSION: 1. Interval cholecystectomy. Large perihepatic fluid collection along the dome of the liver extending anteriorly along the medial segment of the left hepatic lobe concerning for a biloma. Secondarily infected fluid collection cannot be excluded. Electronically Signed   By: HKathreen Devoid  On: 06/20/2018 14:40   Ct Abdomen Pelvis W Contrast  Result Date: 06/09/2018 CLINICAL DATA:  52year old male with abdominal pain and nausea. EXAM: CT ABDOMEN  AND PELVIS WITH CONTRAST TECHNIQUE: Multidetector CT imaging of the abdomen and pelvis was performed using the standard protocol following bolus administration of intravenous contrast. CONTRAST:  133m ISOVUE-300 IOPAMIDOL (ISOVUE-300) INJECTION 61% COMPARISON:  CT of the abdomen pelvis dated 04/04/2017 and MRI dated 05/16/2017 FINDINGS: Lower chest: The visualized lung bases are clear. No intra-abdominal free air or free fluid. Hepatobiliary: Subcentimeter hepatic hypodense lesion is too small to characterize but likely a cyst. The liver is otherwise unremarkable. No intrahepatic biliary ductal dilatation. The gallbladder is unremarkable. Pancreas: Unremarkable. No pancreatic ductal dilatation or surrounding inflammatory changes. Spleen: Normal in size without focal abnormality. Adrenals/Urinary Tract: Adrenal glands are unremarkable. Kidneys are normal, without renal calculi, focal lesion, or hydronephrosis. Bladder is unremarkable. Stomach/Bowel: Stomach is within normal limits. Appendix appears normal. No evidence of bowel wall thickening, distention, or inflammatory changes. Vascular/Lymphatic: No significant vascular findings are present. No enlarged abdominal or pelvic  lymph nodes. Reproductive: The prostate and seminal vesicles are grossly unremarkable. Other: Small fat containing umbilical hernia. Musculoskeletal: No acute or significant osseous findings. IMPRESSION: No acute intra-abdominal or pelvic pathology. Electronically Signed   By: AAnner CreteM.D.   On: 06/09/2018 00:02    Medications / Allergies: per chart  Antibiotics: Anti-infectives (From admission, onward)   None        Note: Portions of this report may have been transcribed using voice recognition software. Every effort was made to ensure accuracy; however, inadvertent computerized transcription errors may be present.   Any transcriptional errors that result from this process are unintentional.    SAdin Hector MD, FACS, MASCRS Gastrointestinal and Minimally Invasive Surgery    [address] [place] [phone] / Paging [phone]   06/21/2018

## 2018-06-22 DIAGNOSIS — K9189 Other postprocedural complications and disorders of digestive system: Secondary | ICD-10-CM | POA: Diagnosis present

## 2018-06-22 DIAGNOSIS — Z91040 Latex allergy status: Secondary | ICD-10-CM | POA: Diagnosis not present

## 2018-06-22 DIAGNOSIS — Z87891 Personal history of nicotine dependence: Secondary | ICD-10-CM | POA: Diagnosis not present

## 2018-06-22 DIAGNOSIS — H9190 Unspecified hearing loss, unspecified ear: Secondary | ICD-10-CM | POA: Diagnosis present

## 2018-06-22 DIAGNOSIS — K8050 Calculus of bile duct without cholangitis or cholecystitis without obstruction: Secondary | ICD-10-CM | POA: Diagnosis present

## 2018-06-22 DIAGNOSIS — Z8042 Family history of malignant neoplasm of prostate: Secondary | ICD-10-CM | POA: Diagnosis not present

## 2018-06-22 DIAGNOSIS — I10 Essential (primary) hypertension: Secondary | ICD-10-CM | POA: Diagnosis present

## 2018-06-22 DIAGNOSIS — Z9049 Acquired absence of other specified parts of digestive tract: Secondary | ICD-10-CM | POA: Diagnosis not present

## 2018-06-22 DIAGNOSIS — R188 Other ascites: Secondary | ICD-10-CM | POA: Diagnosis not present

## 2018-06-22 DIAGNOSIS — Z8719 Personal history of other diseases of the digestive system: Secondary | ICD-10-CM | POA: Diagnosis not present

## 2018-06-22 DIAGNOSIS — J45909 Unspecified asthma, uncomplicated: Secondary | ICD-10-CM | POA: Diagnosis present

## 2018-06-22 DIAGNOSIS — M5416 Radiculopathy, lumbar region: Secondary | ICD-10-CM | POA: Diagnosis present

## 2018-06-22 DIAGNOSIS — Z800 Family history of malignant neoplasm of digestive organs: Secondary | ICD-10-CM | POA: Diagnosis not present

## 2018-06-22 DIAGNOSIS — Z8371 Family history of colonic polyps: Secondary | ICD-10-CM | POA: Diagnosis not present

## 2018-06-22 DIAGNOSIS — Z91012 Allergy to eggs: Secondary | ICD-10-CM | POA: Diagnosis not present

## 2018-06-22 DIAGNOSIS — K838 Other specified diseases of biliary tract: Secondary | ICD-10-CM | POA: Diagnosis present

## 2018-06-22 DIAGNOSIS — Z91018 Allergy to other foods: Secondary | ICD-10-CM | POA: Diagnosis not present

## 2018-06-22 DIAGNOSIS — Z79899 Other long term (current) drug therapy: Secondary | ICD-10-CM | POA: Diagnosis not present

## 2018-06-22 DIAGNOSIS — E785 Hyperlipidemia, unspecified: Secondary | ICD-10-CM | POA: Diagnosis present

## 2018-06-22 DIAGNOSIS — Z8249 Family history of ischemic heart disease and other diseases of the circulatory system: Secondary | ICD-10-CM | POA: Diagnosis not present

## 2018-06-22 DIAGNOSIS — K839 Disease of biliary tract, unspecified: Secondary | ICD-10-CM

## 2018-06-22 DIAGNOSIS — K219 Gastro-esophageal reflux disease without esophagitis: Secondary | ICD-10-CM | POA: Diagnosis present

## 2018-06-22 DIAGNOSIS — K861 Other chronic pancreatitis: Secondary | ICD-10-CM | POA: Diagnosis present

## 2018-06-22 DIAGNOSIS — Z8052 Family history of malignant neoplasm of bladder: Secondary | ICD-10-CM | POA: Diagnosis not present

## 2018-06-22 MED ORDER — SODIUM CHLORIDE 0.9 % IV SOLN
1.5000 g | Freq: Once | INTRAVENOUS | Status: AC
  Administered 2018-06-23: 1.5 g via INTRAVENOUS
  Filled 2018-06-22: qty 1.5

## 2018-06-22 NOTE — Progress Notes (Signed)
06/22/2018  Pt. Had an active day, he has been moving adlib and ambulating within his room frequently. Pt. Also ambulated in hallway multiple times today with ease.  Mr. Summer has good understanding of the JP drain in his abdomen and expresses when it needs to be drained. He also notifies nursing staff when he has used the urinal for output measurement. Patient is in good spirits, though is frustrated with liquid diet at this time and expresses readiness for a regular diet.  Kendell Bane, Student-RN

## 2018-06-22 NOTE — Progress Notes (Signed)
Gary Weaver [id number] [birth year]  CARE TEAM:  PCP: Lillard Anes, MD  Outpatient Care Team: Patient Care Team: Lillard Anes, MD as PCP - General (Family Medicine) Michael Boston, MD as Consulting Physician (General Surgery) Bolivar, Kirke Corin, MD as Consulting Physician (Gastroenterology) Marijo File Revonda Standard., MD as Consulting Physician (Cardiology)  Inpatient Treatment Team: Treatment Team: Attending Provider: Michael Boston, MD; Registered Nurse: Forestine Chute, RN; Technician: Rolan Bucco, NT; Technician: Gasper Sells, [place]; Rounding Team: Dorthy Cooler Radiology, MD; Respiratory Therapist: Philomena Doheny, RRT; Registered Nurse: Dagoberto Ligas, RN   Problem List:   Principal Problem:   Intra-abdominal fluid collection Active Problems:   Radiculopathy, lumbar region   Multiple joint pain   Insomnia   Chronic pancreatitis ([place])   Acute on chronic cholecystitis s/p lap cholecystectomy 19/62/2297   Umbilical hernia s/p primary repair 06/13/2018  S/p lap chole Assessment  Right upper quadrant abdominal fluid collection.  Perc drain.  Plan:  Looks bilious, but bilirubin from fluid pending.  Will consult GI.    Pain control.  -VTE prophylaxis- SCDs, etc -mobilize as tolerated to help recovery  06/22/2018    Subjective: (Chief complaint) A bit worse pain today since the drain was placed.  Objective:  Vital signs:  Vitals:   06/21/18 1831 06/21/18 1901 06/21/18 2045 06/22/18 0439  BP: 116/80 (!) 126/91 130/85 106/74  Pulse: (!) 101 (!) 101 (!) 112 86  Resp:   18   Temp:   99.7 F (37.6 C) 99.2 F (37.3 C)  TempSrc:   Oral Oral  SpO2: 94% 95% 93% 94%  Weight:      Height:        Last BM Date: 06/19/18  Intake/Output   Yesterday:  11/08 0701 - 11/09 0700 In: 1900 [I.V.:1890] Out: 785 [Urine:550; Drains:235] This shift:  No intake/output data recorded.  Bowel function:  Flatus: YES  BM:   No  Drain: (No drain)   Physical Exam:  Alert and oriented  Sl jaundiced.   JP bilious. abd soft.     Results:   Labs: Results for orders placed or performed during the hospital encounter of 06/20/18 (from the past 48 hour(s))  CBC     Status: Abnormal   Collection Time: 06/21/18  2:12 AM  Result Value Ref Range   WBC 12.7 (H) 4.0 - 10.5 K/uL   RBC 3.92 (L) 4.22 - 5.81 MIL/uL   Hemoglobin 11.4 (L) 13.0 - 17.0 g/dL   HCT 34.5 (L) 39.0 - 52.0 %   MCV 88.0 80.0 - 100.0 fL   MCH 29.1 26.0 - 34.0 pg   MCHC 33.0 30.0 - 36.0 g/dL   RDW 12.3 11.5 - 15.5 %   Platelets 338 150 - 400 K/uL   nRBC 0.0 0.0 - 0.2 %    Comment: Performed at [facility], [place] [address], [place], [place] [address]  Comprehensive metabolic panel     Status: Abnormal   Collection Time: 06/21/18  2:12 AM  Result Value Ref Range   Sodium 131 (L) 135 - 145 mmol/L   Potassium 3.8 3.5 - 5.1 mmol/L   Chloride 98 98 - 111 mmol/L   CO2 24 22 - 32 mmol/L   Glucose, Bld 125 (H) 70 - 99 mg/dL   BUN 10 6 - 20 mg/dL   Creatinine, Ser 1.27 (H) 0.61 - 1.24 mg/dL   Calcium 8.7 (L) 8.9 - 10.3 mg/dL   Total Protein 6.1 (L)  6.5 - 8.1 g/dL   Albumin 3.0 (L) 3.5 - 5.0 g/dL   AST 26 15 - 41 U/L   ALT 32 0 - 44 U/L   Alkaline Phosphatase 114 38 - 126 U/L   Total Bilirubin 1.7 (H) 0.3 - 1.2 mg/dL   GFR calc non Af Amer >60 >60 mL/min   GFR calc Af Amer >60 >60 mL/min    Comment: (NOTE) The eGFR has been calculated using the CKD EPI equation. This calculation has not been validated in all clinical situations. eGFR's persistently <60 mL/min signify possible Chronic Kidney Disease.    Anion gap 9 5 - 15    Comment: Performed at [facility] [address], [place], [place] [address]  Protime-INR     Status: None   Collection Time: 06/21/18  2:12 AM  Result Value Ref Range   Prothrombin Time 14.4 11.4 - 15.2 seconds   INR 1.13     Comment: Performed at [facility] [address], [place],  [place] [address]  Gram stain     Status: None   Collection Time: 06/21/18  5:24 PM  Result Value Ref Range   Specimen Description PERITONEAL    Special Requests NONE    Gram Stain      NO WBC SEEN NO ORGANISMS SEEN Performed at [facility], [place] [address] [place], [place] [address]    Report Status 06/21/2018 FINAL     Imaging / Studies: Ct Abdomen Pelvis W Contrast  Result Date: 06/20/2018 CLINICAL DATA:  Right upper quadrant and mid abdominal pain. Recent cholecystectomy 06/13/2018 EXAM: CT ABDOMEN AND PELVIS WITH CONTRAST TECHNIQUE: Multidetector CT imaging of the abdomen and pelvis was performed using the standard protocol following bolus administration of intravenous contrast. CONTRAST:  149m ISOVUE-300 IOPAMIDOL (ISOVUE-300) INJECTION 61% COMPARISON:  06/08/2018 FINDINGS: Lower chest: Trace right pleural effusion with atelectasis. Hepatobiliary: No focal hepatic mass. Interval cholecystectomy. Large perihepatic fluid collection along the dome of the liver extending anteriorly along the medial segment of the left hepatic lobe measuring approximately 3.6 x 8.6 x 9 cm. Secondarily infected fluid collection cannot be excluded. Pancreas: Unremarkable. No pancreatic ductal dilatation or surrounding inflammatory changes. Spleen: Normal in size without focal abnormality. Adrenals/Urinary Tract: Adrenal glands are unremarkable. Kidneys are normal, without renal calculi, focal lesion, or hydronephrosis. Bladder is unremarkable. Stomach/Bowel: Stomach is within normal limits. No evidence of bowel wall thickening, distention, or inflammatory changes. Large amount of stool throughout the colon. Vascular/Lymphatic: No significant vascular findings are present. No enlarged abdominal or pelvic lymph nodes. Reproductive: Prostate is unremarkable. Other: No abdominal wall hernia or abnormality. No abdominopelvic ascites. Musculoskeletal: No acute or significant osseous findings. IMPRESSION: 1. Interval  cholecystectomy. Large perihepatic fluid collection along the dome of the liver extending anteriorly along the medial segment of the left hepatic lobe concerning for a biloma. Secondarily infected fluid collection cannot be excluded. Electronically Signed   By: HKathreen Devoid  On: 06/20/2018 14:40   Ct Image Guided Drainage By Percutaneous Catheter  Result Date: 06/21/2018 INDICATION: 52year old male with abdominal pain following cholecystectomy. CT imaging demonstrates a perihepatic fluid collection concerning for bile leak. Patient presents for drain placement. Fluid to be sent for culture and bilirubin. EXAM: CT-guided drain placement, abdominal MEDICATIONS: The patient is currently admitted to the hospital and receiving intravenous antibiotics. The antibiotics were administered within an appropriate time frame prior to the initiation of the procedure. ANESTHESIA/SEDATION: Fentanyl 100 mcg IV; Versed 2 mg  IV Moderate Sedation Time:  21 minutes The patient was continuously monitored during the procedure by the interventional radiology nurse under my direct supervision. COMPLICATIONS: None immediate. PROCEDURE: Informed written consent was obtained from the patient after a thorough discussion of the procedural risks, benefits and alternatives. All questions were addressed. A timeout was performed prior to the initiation of the procedure. A planning axial CT scan was performed. The perihepatic fluid collection has significantly improved since yesterday's examination. However, there is still sufficient fluid for drainage and the patient remains highly symptomatic. Therefore, a suitable skin entry site was selected and marked. The overlying skin was sterilely prepped and draped in the standard fashion using chlorhexidine skin prep. Local anesthesia was attained by infiltration with 1% lidocaine. A small dermatotomy was made. An 18 gauge needle was carefully advanced into the fluid collection. A 0.035 wire was then  advanced over the anterior surface of the liver and into the subdiaphragmatic space. The skin tract was dilated to 12 [place]. A Cook 12 [place] all-purpose drainage catheter modified with additional sideholes was then advanced over the wire and formed. There was copious return of thin brown fluid. A sample was sent for culture and fluid total bilirubin. The catheter was then secured to the skin with 0 Prolene suture and connected to JP bulb suction. Post placement CT imaging demonstrates a well-positioned drain and near complete interval resolution of the perihepatic fluid collection. IMPRESSION: Successful placement of a 12 French drainage catheter modified with additional sideholes into the perihepatic fluid collection. Several 100 cc of thin brown fluid was aspirated. The appearance is suggestive of a bile leak. The fluid was sent for both culture and total bilirubin. Of note, the volume of fluid at the time of the intervention had already improved considerably compared to yesterday's CT scan. This offers some hope that the bile leak has already resolved and that further drain output may be minimal. Signed, Criselda Peaches, MD, RPVI [facility] Radiology Electronically Signed   By: Jacqulynn Cadet M.D.   On: 06/21/2018 17:49    Medications / Allergies: per chart  Antibiotics: Anti-infectives (From admission, onward)   None        Note: Portions of this report may have been transcribed using voice recognition software. Every effort was made to ensure accuracy; however, inadvertent computerized transcription errors may be present.   Any transcriptional errors that result from this process are unintentional.

## 2018-06-22 NOTE — Consult Note (Signed)
Consultation  Referring Provider: No ref. provider found Primary Care Physician:  Abigail Miyamoto, MD Primary Gastroenterologist:  Dr.Danis  Reason for Consultation:   Bile leak   HPI: Gary Weaver is a 52 y.o. male , established with [facility], GI/Dr. Myrtie Neither who was admitted yesterday with worsening abdominal pain , early feeling poorly with malaise and decreased appetite over the previous couple of days.  He had undergone laparoscopic cholecystectomy on 06/13/2018 as well as umbilical hernia repair.  This was done for cholelithiasis, and prior history of pancreatitis and gallstone induced.  Cholangiogram was done and negative Evaluation in the ER on readmission with CT of the abdomen and pelvis shows a large fluid collection on the dome of the liver, not clearly in the gallbladder fossa This measured 9 cm x 8.6 x 3.6 cm, could not exclude secondarily infected fluid collection in the pancreas unremarkable as well as the liver.  There was trace right pleural effusion.  Surgery contacted IR and he underwent CT-guided percutaneous drainage of the fluid collection yesterday.  There were several 100 cc of thin brown fluid aspirated which had appearance consistent with bile.  Fluid has been sent for culture and total bilirubin both of which are pending.  Patient feels much better today with much milder upper abdominal discomfort.  He has been afebrile, no nausea or vomiting  GI is asked to see for ERCP and stent placement.   Past Medical History:  Diagnosis Date  . Anxiety   . Arrhythmia   . Arthritis   . Asthma   . GERD (gastroesophageal reflux disease)   . Gluten intolerance   . Headache   . Hearing loss   . Hyperlipidemia   . Hypertension   . Memory loss   . Pancreatitis   . Pneumonia    hx of   . Vision abnormalities     Past Surgical History:  Procedure Laterality Date  . COLONOSCOPY    . LAPAROSCOPIC CHOLECYSTECTOMY SINGLE SITE WITH INTRAOPERATIVE  CHOLANGIOGRAM N/A 06/13/2018   Procedure: LAPAROSCOPIC CHOLECYSTECTOMY SINGLE SITE WITH INTRAOPERATIVE CHOLANGIOGRAM ERAS PATHWAY;  Surgeon: Karie Soda, MD;  Location: [facility] ORS;  Service: General;  Laterality: N/A;  . UMBILICAL HERNIA REPAIR N/A 06/13/2018   Procedure: LAPAROSCOPIC POSSIBLE OPEN UMBILICAL HERNIA;  Surgeon: Karie Soda, MD;  Location: [facility] ORS;  Service: General;  Laterality: N/A;  . UPPER GASTROINTESTINAL ENDOSCOPY    . WISDOM TOOTH EXTRACTION      Prior to Admission medications   Medication Sig Start Date End Date Taking? Authorizing Provider  albuterol (PROVENTIL HFA;VENTOLIN HFA) 108 (90 Base) MCG/ACT inhaler Inhale 2 puffs into the lungs every 6 (six) hours as needed for wheezing or shortness of breath.   Yes [provider]  cholecalciferol (VITAMIN D) 1000 units tablet Take 4,000 Units by mouth daily.   Yes [provider]  cyclobenzaprine (FLEXERIL) 5 MG tablet Take 1 tablet (5 mg total) by mouth at bedtime. Patient taking differently: Take 5 mg by mouth 3 (three) times daily as needed for muscle spasms.  11/14/16  Yes Sater, Pearletha Furl, MD  fexofenadine (ALLEGRA) 180 MG tablet Take 180 mg by mouth daily.   Yes [provider]  gabapentin (NEURONTIN) 300 MG capsule Take 300-2,100 mg by mouth 3 (three) times daily as needed (pain).    Yes [provider]  hydrOXYzine (VISTARIL) 25 MG capsule Take 25 mg by mouth daily.    Yes [provider]  metoCLOPramide (REGLAN) 5 MG tablet Take  5 mg by mouth every 6 (six) hours as needed for nausea or vomiting.    Yes [provider]  omeprazole (PRILOSEC) 40 MG capsule Take 40 mg by mouth daily.   Yes [provider]  ondansetron (ZOFRAN ODT) 4 MG disintegrating tablet Take 1 tablet (4 mg total) by mouth every 8 (eight) hours as needed for nausea or vomiting. Patient taking differently: Take 4 mg by mouth at bedtime.  06/09/18  Yes Upstill, Melvenia Beam, PA-C  traMADol (ULTRAM) 50  MG tablet Take 1-2 tablets (50-100 mg total) by mouth every 6 (six) hours as needed for severe pain. 06/13/18  Yes Karie Soda, MD  valsartan (DIOVAN) 160 MG tablet Take 80 mg by mouth daily.   Yes [provider]    Current Facility-Administered Medications  Medication Dose Route Frequency Provider Last Rate Last Dose  . acetaminophen (TYLENOL) tablet 650 mg  650 mg Oral Q6H PRN Karie Soda, MD       Or  . acetaminophen (TYLENOL) suppository 650 mg  650 mg Rectal Q6H PRN Karie Soda, MD      . albuterol (PROVENTIL) (2.5 MG/3ML) 0.083% nebulizer solution 3 mL  3 mL Inhalation Q6H PRN Karie Soda, MD      . cholecalciferol (VITAMIN D3) tablet 4,000 Units  4,000 Units Oral Daily Karie Soda, MD   4,000 Units at 06/22/18 1048  . diphenhydrAMINE (BENADRYL) 12.5 MG/5ML elixir 12.5 mg  12.5 mg Oral Q6H PRN Karie Soda, MD       Or  . diphenhydrAMINE (BENADRYL) injection 12.5 mg  12.5 mg Intravenous Q6H PRN Karie Soda, MD      . gabapentin (NEURONTIN) capsule 300 mg  300 mg Oral Laurena Slimmer, MD   300 mg at 06/21/18 2130  . hydrALAZINE (APRESOLINE) injection 5-10 mg  5-10 mg Intravenous Q4H PRN Karie Soda, MD      . HYDROmorphone (DILAUDID) injection 0.5-2 mg  0.5-2 mg Intravenous Q1H PRN Karie Soda, MD   1 mg at 06/22/18 0901  . hydrOXYzine (ATARAX/VISTARIL) tablet 25 mg  25 mg Oral Daily Karie Soda, MD   25 mg at 06/22/18 1049  . irbesartan (AVAPRO) tablet 150 mg  150 mg Oral Daily Karie Soda, MD   150 mg at 06/22/18 1048  . lactated ringers bolus 1,000 mL  1,000 mL Intravenous TID PRN Karie Soda, MD      . lactated ringers infusion   Intravenous Continuous Karie Soda, MD 100 mL/hr at 06/22/18 0300    . lip balm (CARMEX) ointment 1 application  1 application Topical BID Karie Soda, MD   1 application at 06/22/18 1049  . loratadine (CLARITIN) tablet 10 mg  10 mg Oral Daily Karie Soda, MD   10 mg at 06/22/18 1048  . LORazepam (ATIVAN) injection  0.5-1 mg  0.5-1 mg Intravenous Q8H PRN Karie Soda, MD      . magic mouthwash  15 mL Oral QID PRN Karie Soda, MD      . methocarbamol (ROBAXIN) 1,000 mg in dextrose 5 % 50 mL IVPB  1,000 mg Intravenous Q6H PRN Karie Soda, MD      . metoCLOPramide (REGLAN) tablet 5 mg  5 mg Oral Q6H PRN Kinsinger, De Blanch, MD   5 mg at 06/21/18 2130  . metoprolol tartrate (LOPRESSOR) injection 5 mg  5 mg Intravenous Q6H PRN Karie Soda, MD      . ondansetron (ZOFRAN-ODT) disintegrating tablet 4 mg  4 mg Oral Q6H PRN Karie Soda, MD  Or  . ondansetron (ZOFRAN) injection 4 mg  4 mg Intravenous Q6H PRN Karie Soda, MD      . oxyCODONE (Oxy IR/ROXICODONE) immediate release tablet 5-10 mg  5-10 mg Oral Q4H PRN Karie Soda, MD   10 mg at 06/22/18 1048  . pantoprazole (PROTONIX) EC tablet 40 mg  40 mg Oral Daily Karie Soda, MD   40 mg at 06/22/18 1048  . prochlorperazine (COMPAZINE) injection 5-10 mg  5-10 mg Intravenous Q4H PRN Karie Soda, MD      . simethicone [facility]) chewable tablet 40 mg  40 mg Oral Q6H PRN Karie Soda, MD      . sodium chloride flush (NS) 0.9 % injection 5 mL  5 mL Intracatheter Q8H Malachy Moan, MD   5 mL at 06/22/18 0642  . traMADol (ULTRAM) tablet 50-100 mg  50-100 mg Oral Q6H PRN Karie Soda, MD        Allergies as of 06/20/2018 - Review Complete 06/20/2018  Allergen Reaction Noted  . Eggs or egg-derived products Diarrhea 06/11/2018  . Gluten meal Nausea And Vomiting 02/05/2014  . Latex Rash 06/13/2018    Family History  Problem Relation Age of Onset  . Hypertension Father   . Prostate cancer Father   . Colon polyps Father   . Bladder Cancer Mother   . Colon cancer Maternal Grandmother   . Esophageal cancer Neg Hx   . Rectal cancer Neg Hx   . Stomach cancer Neg Hx   . Pancreatic cancer Neg Hx     Social History   Socioeconomic History  . Marital status: Married    Spouse name: Not on file  . Number of children: 3  . Years of  education: Not on file  . Highest education level: Not on file  Occupational History  . Not on file  Social Needs  . Financial resource strain: Not on file  . Food insecurity:    Worry: Not on file    Inability: Not on file  . Transportation needs:    Medical: Not on file    Non-medical: Not on file  Tobacco Use  . Smoking status: Never Smoker  . Smokeless tobacco: Former Neurosurgeon    Types: Chew  Substance and Sexual Activity  . Alcohol use: Not Currently  . Drug use: No  . Sexual activity: Not on file  Lifestyle  . Physical activity:    Days per week: Not on file    Minutes per session: Not on file  . Stress: Not on file  Relationships  . Social connections:    Talks on phone: Not on file    Gets together: Not on file    Attends religious service: Not on file    Active member of club or organization: Not on file    Attends meetings of clubs or organizations: Not on file    Relationship status: Not on file  . Intimate partner violence:    Fear of current or ex partner: Not on file    Emotionally abused: Not on file    Physically abused: Not on file    Forced sexual activity: Not on file  Other Topics Concern  . Not on file  Social History Narrative  . Not on file    Review of Systems: Pertinent positive and negative review of systems were noted in the above HPI section.  All other review of systems was otherwise negative.  Physical Exam: Vital signs in last 24 hours: Temp:  [98.2  F (36.8 C)-99.7 F (37.6 C)] 98.2 F (36.8 C) (11/09 1000) Pulse Rate:  [86-112] 102 (11/09 1000) Resp:  [14-18] 18 (11/08 2045) BP: (106-142)/(74-97) 133/87 (11/09 1000) SpO2:  [91 %-97 %] 95 % (11/09 1000) Last BM Date: 06/19/18 General:   Alert,  Well-developed, well-nourished, pleasant and cooperative in NAD Head:  Normocephalic and atraumatic. Eyes:  Sclera clear, no icterus.   Conjunctiva pink. Ears:  Normal auditory acuity. Nose:  No deformity, discharge,  or lesions. Mouth:   No deformity or lesions.   Neck:  Supple; no masses or thyromegaly. Lungs:  Clear throughout to auscultation.   No wheezes, crackles, or rhonchi. Heart:  Regular rate and rhythm; no murmurs, clicks, rubs,  or gallops. Abdomen:  Soft,mildly tender across upper abdomen , BS active,nonpalp mass or hsm.  Cutaneous drain with about 20 cc of greenish-brown bilious appearing material  rectal:  Deferred  Msk:  Symmetrical without gross deformities. . Pulses:  Normal pulses noted. Extremities:  Without clubbing or edema. Neurologic:  Alert and  oriented x4;  grossly normal neurologically. Skin:  Intact without significant lesions or rashes.. Psych:  Alert and cooperative. Normal mood and affect.  Intake/Output from previous day: 11/08 0701 - 11/09 0700 In: 1900 [I.V.:1890] Out: 785 [Urine:550; Drains:235] Intake/Output this shift: No intake/output data recorded.  Lab Results: Recent Labs    06/21/18 0212  WBC 12.7*  HGB 11.4*  HCT 34.5*  PLT 338   BMET Recent Labs    06/21/18 0212  NA 131*  K 3.8  CL 98  CO2 24  GLUCOSE 125*  BUN 10  CREATININE 1.27*  CALCIUM 8.7*   LFT Recent Labs    06/21/18 0212  PROT 6.1*  ALBUMIN 3.0*  AST 26  ALT 32  ALKPHOS 114  BILITOT 1.7*   PT/INR Recent Labs    06/21/18 0212  LABPROT 14.4  INR 1.13      IMPRESSION:   #35 52 year old white male status post laparoscopic cholecystectomy with negative IOC on 06/13/2018, who was readmitted yesterday with worsening upper abdominal pain over the previous few days, decrease in appetite and general malaise. He was found to have a large fluid collection on the dome of the liver by CT  He is now stable status post percutaneous drainage by IR is feeling much better. He drained a total of 235 cc yesterday.  It has been sent for culture and T bili-both are still pending  Fluid collection felt to represent a biloma  #2 history of pancreatitis #3 tonic arthralgias and low back pain #4  GERD #5 hypertension  Plan; Patient may have clear liquids today, n.p.o. past midnight Patient has been scheduled for ERCP with stent placement with Dr. Marca Ancona tomorrow morning 06/23/2018.  Procedure was discussed in detail with the patient and his mother who is at bedside, including indications risks and benefits.  We discussed 5-6% incidence of post ERCP pancreatitis.  He is not currently on antibiotics, will give preprocedure.  Thank you we will follow with you   Gwyneth Fernandez  06/22/2018, 11:59 AM

## 2018-06-23 ENCOUNTER — Encounter (HOSPITAL_COMMUNITY): Payer: Self-pay | Admitting: *Deleted

## 2018-06-23 ENCOUNTER — Inpatient Hospital Stay (HOSPITAL_COMMUNITY): Payer: BC Managed Care – PPO

## 2018-06-23 ENCOUNTER — Encounter (HOSPITAL_COMMUNITY)
Admission: AD | Disposition: A | Discharge status: Home / Self Care | Payer: Self-pay | Source: Home / Self Care | Attending: Surgery

## 2018-06-23 ENCOUNTER — Inpatient Hospital Stay (HOSPITAL_COMMUNITY): Payer: BC Managed Care – PPO | Admitting: Certified Registered"

## 2018-06-23 HISTORY — PX: BILIARY STENT PLACEMENT: SHX5538

## 2018-06-23 HISTORY — PX: SPHINCTEROTOMY: SHX5544

## 2018-06-23 HISTORY — PX: ERCP: SHX5425

## 2018-06-23 SURGERY — ERCP, WITH INTERVENTION IF INDICATED
Anesthesia: General

## 2018-06-23 MED ORDER — DEXAMETHASONE SODIUM PHOSPHATE 4 MG/ML IJ SOLN
INTRAMUSCULAR | Status: DC | PRN
  Administered 2018-06-23: 4 mg via INTRAVENOUS

## 2018-06-23 MED ORDER — FENTANYL CITRATE (PF) 100 MCG/2ML IJ SOLN
INTRAMUSCULAR | Status: DC | PRN
  Administered 2018-06-23: 100 ug via INTRAVENOUS

## 2018-06-23 MED ORDER — LIDOCAINE HCL (CARDIAC) PF 100 MG/5ML IV SOSY
PREFILLED_SYRINGE | INTRAVENOUS | Status: DC | PRN
  Administered 2018-06-23: 100 mg via INTRAVENOUS

## 2018-06-23 MED ORDER — SODIUM CHLORIDE 0.9 % IV SOLN
INTRAVENOUS | Status: DC | PRN
  Administered 2018-06-23: 28 mL

## 2018-06-23 MED ORDER — PHENOL 1.4 % MT LIQD
1.0000 | OROMUCOSAL | Status: DC | PRN
  Administered 2018-06-23: 1 via OROMUCOSAL
  Filled 2018-06-23: qty 177

## 2018-06-23 MED ORDER — PHENYLEPHRINE HCL 10 MG/ML IJ SOLN
INTRAMUSCULAR | Status: DC | PRN
  Administered 2018-06-23: 80 ug via INTRAVENOUS

## 2018-06-23 MED ORDER — SODIUM CHLORIDE 0.9 % IV SOLN: INTRAVENOUS | Status: DC

## 2018-06-23 MED ORDER — IOPAMIDOL (ISOVUE-300) INJECTION 61%
INTRAVENOUS | Status: AC
  Filled 2018-06-23: qty 50

## 2018-06-23 MED ORDER — ONDANSETRON HCL 4 MG/2ML IJ SOLN: 4.0000 mg | Freq: Once | INTRAMUSCULAR | Status: DC | PRN

## 2018-06-23 MED ORDER — INDOMETHACIN 50 MG RE SUPP
RECTAL | Status: DC | PRN
  Administered 2018-06-23: 100 mg via RECTAL

## 2018-06-23 MED ORDER — INDOMETHACIN 50 MG RE SUPP: 100.0000 mg | Freq: Once | RECTAL | Status: DC

## 2018-06-23 MED ORDER — EPHEDRINE SULFATE 50 MG/ML IJ SOLN
INTRAMUSCULAR | Status: DC | PRN
  Administered 2018-06-23: 5 mg via INTRAVENOUS

## 2018-06-23 MED ORDER — PROPOFOL 10 MG/ML IV BOLUS
INTRAVENOUS | Status: DC | PRN
  Administered 2018-06-23: 180 mg via INTRAVENOUS

## 2018-06-23 MED ORDER — INDOMETHACIN 50 MG RE SUPP
RECTAL | Status: AC
  Filled 2018-06-23: qty 2

## 2018-06-23 MED ORDER — LACTATED RINGERS IV SOLN
INTRAVENOUS | Status: DC
  Administered 2018-06-23 – 2018-06-24 (×2): via INTRAVENOUS

## 2018-06-23 MED ORDER — FENTANYL CITRATE (PF) 100 MCG/2ML IJ SOLN
INTRAMUSCULAR | Status: AC
  Filled 2018-06-23: qty 2

## 2018-06-23 MED ORDER — FENTANYL CITRATE (PF) 100 MCG/2ML IJ SOLN: 25.0000 ug | INTRAMUSCULAR | Status: DC | PRN

## 2018-06-23 MED ORDER — GLUCAGON HCL RDNA (DIAGNOSTIC) 1 MG IJ SOLR
INTRAMUSCULAR | Status: AC
  Filled 2018-06-23: qty 1

## 2018-06-23 NOTE — Op Note (Addendum)
ERCP was performed for treatment of bile leak.  Findings: Pancreatic duct was cannulated twice during attempted CBD cannulation. Wire was left in the PD and CBD was cannulated with a double wire technique, wire in the PD was subsequently removed, PD was never injected. Cholangiogram revealed a small caliber CBD of 4 mm without obvious stricture, stones or leak. Surgical clips and drainage catheter were noted. Initial attempts to place a 10 French 7 cm plastic CBD stent,was met with a lot of resistant at distal CBD,likely due to small caliber of CBD. A 7 French 7 cm plastic stent was left in the common bile duct with good flow of bile noted through the stent.   Recommendations: Clear Liquid diet for 6 hours and advance to solids as tolerated. Monitor drainage. Plan repeat ERCP/EGD in 4-8 weeks for removal of stent depending on biliary drainage.  Ronnette Juniper, M.D.

## 2018-06-23 NOTE — Progress Notes (Signed)
Referring Physician(s): Dr. Johney Maine  Supervising Physician: Jacqulynn Cadet  Patient Status:  [facility] - In-pt  Chief Complaint: Follow-up perihepatic fluid collection drain placement 11/8 with Dr. Laurence Ferrari  Subjective:  52 y/o M with PMH significant for anxiety, asthma, GERD, gluten intolerance, hearing loss, HLD, HTN, pancreatitis, biliary colic and chronic cholecystitis s/p cholecystectomy on 06/13/18 with Dr. Johney Maine and was able to return home that day. Unfortunately he developed abdominal pain and loss of appetite for which he was evaluated by Dr. Johney Maine in his office on 11/8. CT was performed that day which showed a large perihepatic fluid collection along the dome of liver extending anteriorly along the medial segment of the left hepatic lobe concerning for biloma. He was admitted for further evaluation and management. IR was consulted for percutaneous drainage of this collection which was performed successfully on 11/8.   Patient states he's doing ok, anxious about stent placement today. He's hopeful that the stent can be placed and his drian can be removed. He does say that his abdominal pain has decreased a lot since placement of abdominal drain but he is still having pain. He is concerned about the amount of output from the drain stating, "they had to empty it at least 20 times so far - some of those times I asked them to though because I don't want it to get full and pop." He otherwise is doing well, having BMs, ambulating without difficulty.   Allergies: Eggs or egg-derived products; Gluten meal; and Latex  Medications: Prior to Admission medications   Medication Sig Start Date End Date Taking? Authorizing Provider  albuterol (PROVENTIL HFA;VENTOLIN HFA) 108 (90 Base) MCG/ACT inhaler Inhale 2 puffs into the lungs every 6 (six) hours as needed for wheezing or shortness of breath.   Yes [provider]  cholecalciferol (VITAMIN D) 1000 units tablet Take 4,000 Units by mouth  daily.   Yes [provider]  cyclobenzaprine (FLEXERIL) 5 MG tablet Take 1 tablet (5 mg total) by mouth at bedtime. Patient taking differently: Take 5 mg by mouth 3 (three) times daily as needed for muscle spasms.  11/14/16  Yes Sater, Nanine Means, MD  fexofenadine (ALLEGRA) 180 MG tablet Take 180 mg by mouth daily.   Yes [provider]  gabapentin (NEURONTIN) 300 MG capsule Take 300-2,100 mg by mouth 3 (three) times daily as needed (pain).    Yes [provider]  hydrOXYzine (VISTARIL) 25 MG capsule Take 25 mg by mouth daily.    Yes [provider]  metoCLOPramide (REGLAN) 5 MG tablet Take 5 mg by mouth every 6 (six) hours as needed for nausea or vomiting.    Yes [provider]  omeprazole (PRILOSEC) 40 MG capsule Take 40 mg by mouth daily.   Yes [provider]  ondansetron (ZOFRAN ODT) 4 MG disintegrating tablet Take 1 tablet (4 mg total) by mouth every 8 (eight) hours as needed for nausea or vomiting. Patient taking differently: Take 4 mg by mouth at bedtime.  06/09/18  Yes Upstill, Nehemiah Settle, PA-C  traMADol (ULTRAM) 50 MG tablet Take 1-2 tablets (50-100 mg total) by mouth every 6 (six) hours as needed for severe pain. 06/13/18  Yes Michael Boston, MD  valsartan (DIOVAN) 160 MG tablet Take 80 mg by mouth daily.   Yes [provider]     Vital Signs: BP 116/90   Pulse 92   Temp (!) 97.5 F (36.4 C)   Resp 14   Ht 5' 6"  (1.676 m)  Wt 192 lb 0.3 oz (87.1 kg)   SpO2 97%   BMI 30.99 kg/m   Physical Exam  Constitutional: He is oriented to person, place, and time. No distress.  Pacing around room during exam, father present during exam.  HENT:  Head: Normocephalic.  Cardiovascular: Normal rate, regular rhythm and normal heart sounds.  Pulmonary/Chest: Effort normal and breath sounds normal.  Abdominal: Soft. He exhibits no distension. There is tenderness (at drain insertion site).  RUQ JP with ~20 cc bilious output present;  insertion site tender but clean, dry, intact without discharge or bleeding.  Neurological: He is alert and oriented to person, place, and time.  Skin: Skin is warm and dry. He is not diaphoretic.  Psychiatric: He has a normal mood and affect. His behavior is normal.  Nursing note and vitals reviewed.   Imaging: Ct Abdomen Pelvis W Contrast  Result Date: 06/20/2018 CLINICAL DATA:  Right upper quadrant and mid abdominal pain. Recent cholecystectomy 06/13/2018 EXAM: CT ABDOMEN AND PELVIS WITH CONTRAST TECHNIQUE: Multidetector CT imaging of the abdomen and pelvis was performed using the standard protocol following bolus administration of intravenous contrast. CONTRAST:  169m ISOVUE-300 IOPAMIDOL (ISOVUE-300) INJECTION 61% COMPARISON:  06/08/2018 FINDINGS: Lower chest: Trace right pleural effusion with atelectasis. Hepatobiliary: No focal hepatic mass. Interval cholecystectomy. Large perihepatic fluid collection along the dome of the liver extending anteriorly along the medial segment of the left hepatic lobe measuring approximately 3.6 x 8.6 x 9 cm. Secondarily infected fluid collection cannot be excluded. Pancreas: Unremarkable. No pancreatic ductal dilatation or surrounding inflammatory changes. Spleen: Normal in size without focal abnormality. Adrenals/Urinary Tract: Adrenal glands are unremarkable. Kidneys are normal, without renal calculi, focal lesion, or hydronephrosis. Bladder is unremarkable. Stomach/Bowel: Stomach is within normal limits. No evidence of bowel wall thickening, distention, or inflammatory changes. Large amount of stool throughout the colon. Vascular/Lymphatic: No significant vascular findings are present. No enlarged abdominal or pelvic lymph nodes. Reproductive: Prostate is unremarkable. Other: No abdominal wall hernia or abnormality. No abdominopelvic ascites. Musculoskeletal: No acute or significant osseous findings. IMPRESSION: 1. Interval cholecystectomy. Large perihepatic fluid  collection along the dome of the liver extending anteriorly along the medial segment of the left hepatic lobe concerning for a biloma. Secondarily infected fluid collection cannot be excluded. Electronically Signed   By: HKathreen Devoid  On: 06/20/2018 14:40   Ct Image Guided Drainage By Percutaneous Catheter  Result Date: 06/21/2018 INDICATION: 52year old male with abdominal pain following cholecystectomy. CT imaging demonstrates a perihepatic fluid collection concerning for bile leak. Patient presents for drain placement. Fluid to be sent for culture and bilirubin. EXAM: CT-guided drain placement, abdominal MEDICATIONS: The patient is currently admitted to the hospital and receiving intravenous antibiotics. The antibiotics were administered within an appropriate time frame prior to the initiation of the procedure. ANESTHESIA/SEDATION: Fentanyl 100 mcg IV; Versed 2 mg IV Moderate Sedation Time:  21 minutes The patient was continuously monitored during the procedure by the interventional radiology nurse under my direct supervision. COMPLICATIONS: None immediate. PROCEDURE: Informed written consent was obtained from the patient after a thorough discussion of the procedural risks, benefits and alternatives. All questions were addressed. A timeout was performed prior to the initiation of the procedure. A planning axial CT scan was performed. The perihepatic fluid collection has significantly improved since yesterday's examination. However, there is still sufficient fluid for drainage and the patient remains highly symptomatic. Therefore, a suitable skin entry site was selected and marked. The overlying skin was sterilely prepped and draped  in the standard fashion using chlorhexidine skin prep. Local anesthesia was attained by infiltration with 1% lidocaine. A small dermatotomy was made. An 18 gauge needle was carefully advanced into the fluid collection. A 0.035 wire was then advanced over the anterior surface of  the liver and into the subdiaphragmatic space. The skin tract was dilated to 12 [place]. A Cook 12 [place] all-purpose drainage catheter modified with additional sideholes was then advanced over the wire and formed. There was copious return of thin brown fluid. A sample was sent for culture and fluid total bilirubin. The catheter was then secured to the skin with 0 Prolene suture and connected to JP bulb suction. Post placement CT imaging demonstrates a well-positioned drain and near complete interval resolution of the perihepatic fluid collection. IMPRESSION: Successful placement of a 12 French drainage catheter modified with additional sideholes into the perihepatic fluid collection. Several 100 cc of thin brown fluid was aspirated. The appearance is suggestive of a bile leak. The fluid was sent for both culture and total bilirubin. Of note, the volume of fluid at the time of the intervention had already improved considerably compared to yesterday's CT scan. This offers some hope that the bile leak has already resolved and that further drain output may be minimal. Signed, Criselda Peaches, MD, RPVI [facility] Electronically Signed   By: Jacqulynn Cadet M.D.   On: 06/21/2018 17:49    Labs:  CBC: Recent Labs    06/08/18 2007 06/21/18 0212  WBC 6.6 12.7*  HGB 14.4 11.4*  HCT 42.9 34.5*  PLT 278 338    COAGS: Recent Labs    06/21/18 0212  INR 1.13    BMP: Recent Labs    06/08/18 2007 06/21/18 0212  NA 142 131*  K 4.3 3.8  CL 106 98  CO2 26 24  GLUCOSE 95 125*  BUN 13 10  CALCIUM 9.6 8.7*  CREATININE 1.19 1.27*  GFRNONAA >60 >60  GFRAA >60 >60    LIVER FUNCTION TESTS: Recent Labs    06/08/18 2007 06/21/18 0212  BILITOT 1.4* 1.7*  AST 19 26  ALT 21 32  ALKPHOS 47 114  PROT 7.5 6.1*  ALBUMIN 4.7 3.0*    Assessment and Plan:  S/p perihepatic fluid collection aspiration and drain placement 11/8 with Dr.  Laurence Ferrari - drain with significant output since placement (275 cc in last 24 hours, 90 cc so far today), output appears bilious, culture so far shows NGTD and bilirubin is pending as well. Patient planned for ERCP with stent placement today per GI.  Afebrile, WBC 12.7 (11/8), not currently on antibiotics.   Continue TID flushes with 3-5 cc NS, record output daily. Discharge per admitting service - IR will continue to follow.  Please call with questions or concerns.   Electronically Signed: Joaquim Nam, PA-C 06/23/2018, 12:35 PM   I spent a total of 15 Minutes at the the patient's bedside AND on the patient's hospital floor or unit, greater than 50% of which was counseling/coordinating care for perihepatic fluid collection drain.

## 2018-06-23 NOTE — H&P (Signed)
  52 year old male with bile leak postcholecystectomy and IR guided drainage placement is scheduled for an ERCP with stent placement today. The risks(pancreatitis, infection, perforation, bleeding, even death) and benefits of the procedure were discussed with the patient in detail.  Kerin Salen, M.D.

## 2018-06-23 NOTE — Anesthesia Procedure Notes (Signed)
Procedure Name: Intubation Date/Time: 06/23/2018 11:06 AM Performed by: Wilder Glade, CRNA Pre-anesthesia Checklist: Patient identified, Emergency Drugs available, Suction available, Patient being monitored and Timeout performed Patient Re-evaluated:Patient Re-evaluated prior to induction Oxygen Delivery Method: Circle system utilized Preoxygenation: Pre-oxygenation with 100% oxygen Induction Type: IV induction Ventilation: Mask ventilation without difficulty Laryngoscope Size: Miller and 2 Grade View: Grade I Tube type: Oral Tube size: 7.5 mm Number of attempts: 1 Airway Equipment and Method: Stylet Placement Confirmation: ETT inserted through vocal cords under direct vision,  positive ETCO2 and breath sounds checked- equal and bilateral Secured at: 23 cm Tube secured with: Tape Dental Injury: Teeth and Oropharynx as per pre-operative assessment

## 2018-06-23 NOTE — Brief Op Note (Signed)
06/20/2018 - 06/23/2018  11:55 AM  PATIENT:  Gary Weaver  52 y.o. male  PRE-OPERATIVE DIAGNOSIS:  Bile leak  POST-OPERATIVE DIAGNOSIS:  sphincterotomy, stent placed  PROCEDURE:  Procedure(s): ENDOSCOPIC RETROGRADE CHOLANGIOPANCREATOGRAPHY (ERCP) (N/A) SPHINCTEROTOMY BILIARY STENT PLACEMENT  SURGEON:  Surgeon(s) and Role:    Ronnette Juniper, MD - Primary  PHYSICIAN ASSISTANT:   ASSISTANTS: Burtis Junes, RN, Hope Parker, Tech  ANESTHESIA:   MAC  EBL:  0 mL   BLOOD ADMINISTERED:none  DRAINS: none   LOCAL MEDICATIONS USED:  NONE  SPECIMEN:  No Specimen  DISPOSITION OF SPECIMEN:  N/A  COUNTS:  YES  TOURNIQUET:  * No tourniquets in log *  DICTATION: .Dragon Dictation  PLAN OF CARE: Admit to inpatient   PATIENT DISPOSITION:  PACU - hemodynamically stable.   Delay start of Pharmacological VTE agent (>24hrs) due to surgical blood loss or risk of bleeding: no

## 2018-06-23 NOTE — Transfer of Care (Signed)
Immediate Anesthesia Transfer of Care Note  Patient: Gary Weaver  Procedure(s) Performed: ENDOSCOPIC RETROGRADE CHOLANGIOPANCREATOGRAPHY (ERCP) (N/A ) SPHINCTEROTOMY BILIARY STENT PLACEMENT  Patient Location: PACU  Anesthesia Type:General  Level of Consciousness: awake, alert , oriented and patient cooperative  Airway & Oxygen Therapy: Patient Spontanous Breathing and Patient connected to nasal cannula oxygen  Post-op Assessment: Report given to RN and Post -op Vital signs reviewed and stable  Post vital signs: Reviewed and stable  Last Vitals:  Vitals Value Taken Time  BP 107/76 06/23/2018 12:14 PM  Temp    Pulse 89 06/23/2018 12:17 PM  Resp 16 06/23/2018 12:17 PM  SpO2 98 % 06/23/2018 12:17 PM  Vitals shown include unvalidated device data.  Last Pain:  Vitals:   06/23/18 1015  TempSrc: Oral  PainSc: 0-No pain      Patients Stated Pain Goal: 3 (06/23/18 0705)  Complications: No apparent anesthesia complications

## 2018-06-23 NOTE — Op Note (Signed)
[facility] Patient Name: Gary Weaver Procedure Date : 06/23/2018 MRN: [id number] Attending MD: Kerin Salen , MD Date of Birth: [birth year] CSN: [id number] Age: 52 Admit Type: Inpatient Procedure:                ERCP Indications:              Bile leak Providers:                Kerin Salen, MD, Norman Clay, RN, Harrington Challenger,                            Technician Referring MD:              Medicines:                Monitored Anesthesia Care Complications:            No immediate complications. Estimated blood loss:                            Minimal. Estimated Blood Loss:     Estimated blood loss was minimal. Procedure:                Pre-Anesthesia Assessment:                           - Prior to the procedure, a History and Physical                            was performed, and patient medications and                            allergies were reviewed. The patient's tolerance of                            previous anesthesia was also reviewed. The risks                            and benefits of the procedure and the sedation                            options and risks were discussed with the patient.                            All questions were answered, and informed consent                            was obtained. Prior Anticoagulants: The patient has                            taken no previous anticoagulant or antiplatelet                            agents. ASA Grade Assessment: II - A patient with                            mild systemic disease. After  reviewing the risks                            and benefits, the patient was deemed in                            satisfactory condition to undergo the procedure.                           After obtaining informed consent, the scope was                            passed under direct vision. Throughout the                            procedure, the patient's blood pressure, pulse, and                            oxygen  saturations were monitored continuously. The                            [id number]-Q180V ([id number]) Olympus ERCP was introduced                            through the mouth, and used to inject contrast into                            and used to inject contrast into the bile duct. The                            ERCP was accomplished without difficulty. The                            patient tolerated the procedure well. Scope In: Scope Out: Findings:      The scout film was normal. The esophagus was successfully intubated       under direct vision. The scope was advanced to a normal major papilla in       the descending duodenum without detailed examination of the pharynx,       larynx and associated structures, and upper GI tract. The upper GI tract       was grossly normal. The pancreatic duct was canulated twice during       attempted CBD canulation. A wire was left in the pancreatic duct and the       bile duct was deeply cannulated with the sphincterotome- double wire       technique.The wire in the pancreatic duct was removed subsequently.       Contrast was injected. I personally interpreted the bile duct images.       There was brisk flow of contrast through the ducts. Image quality was       excellent. Contrast extended to the entire biliary tree. The main bile       duct was normal and was only 4 mm in size, no obvious leak was noted.       Surgical clips and drainage catheter were noted. A straight  Roadrunner       wire was passed into the biliary tree. An 8 mm biliary sphincterotomy       was made with a braided sphincterotome using ERBE electrocautery. There       was no post-sphincterotomy bleeding. A balloon was used to obtained an       obstructive cholangiogram, there were no filling defects, obvious leak       or stricure noted, the CBD was small in caliber. An attempt was made to       place a 10 Fr 7 cm into the CBD however, the stent could not be advanced       in the CBD,  possible due to small caliber of CBD noted on cholangiogram.       Thereafter, One 7 Fr by 7 cm plastic stent with a single external flap       and a single internal flap was placed 6 cm into the common bile duct.       Bile flowed through the stent. The stent was in good position. Impression:               - A biliary sphincterotomy was performed.                           - One plastic stent was placed into the common bile                            duct.                           - The pancreatic duct was canulated twice, wire was                            left in the PD to canulate the CBD but PD was never                            injected. A dose of rectal indomethacin was given                            to the patient. Moderate Sedation:      Patient did not receive moderate sedation for this procedure, but       instead received monitored anesthesia care. Recommendation:           - Clear liquid diet for 6 hours, adavnce to solids                            as tolerated.                           - Repeat ERCP/EGD at appointment to be scheduled                            for removal of plastic CBD stent in 4-8 weeks                            depending on biliary drainage. Procedure Code(s):        --- Professional ---  16109, Endoscopic retrograde                            cholangiopancreatography (ERCP); with placement of                            endoscopic stent into biliary or pancreatic duct,                            including pre- and post-dilation and guide wire                            passage, when performed, including sphincterotomy,                            when performed, each stent                           60454, Endoscopic catheterization of the biliary                            ductal system, radiological supervision and                            interpretation Diagnosis Code(s):        --- Professional ---                            K83.8, Other specified diseases of biliary tract CPT copyright 2018 American Medical Association. All rights reserved. The codes documented in this report are preliminary and upon coder review may  be revised to meet current compliance requirements. Kerin Salen, MD 06/23/2018 11:55:07 AM This report has been signed electronically. Number of Addenda: 0

## 2018-06-23 NOTE — Anesthesia Preprocedure Evaluation (Addendum)
Anesthesia Evaluation  Patient identified by MRN, date of birth, ID band Patient awake    Reviewed: Allergy & Precautions, NPO status , Patient's Chart, lab work & pertinent test results  Airway Mallampati: II  TM Distance: >3 FB Neck ROM: Full    Dental  (+) Dental Advisory Given, Missing   Pulmonary asthma ,    Pulmonary exam normal breath sounds clear to auscultation       Cardiovascular hypertension, Pt. on medications Normal cardiovascular exam Rhythm:Regular Rate:Normal     Neuro/Psych  Headaches, PSYCHIATRIC DISORDERS Anxiety  Neuromuscular disease    GI/Hepatic GERD  Medicated,Bile leak Acute on chronic cholecystitis s/p lap cholecystectomy 06/13/2018   Endo/Other  Obesity   Renal/GU negative Renal ROS     Musculoskeletal  (+) Arthritis ,   Abdominal   Peds  Hematology  (+) Blood dyscrasia, anemia ,   Anesthesia Other Findings Day of surgery medications reviewed with the patient.  Reproductive/Obstetrics                            Anesthesia Physical Anesthesia Plan  ASA: II and emergent  Anesthesia Plan: General   Post-op Pain Management:    Induction: Intravenous  PONV Risk Score and Plan: 2 and Ondansetron, Dexamethasone and Midazolam  Airway Management Planned: Oral ETT  Additional Equipment:   Intra-op Plan:   Post-operative Plan: Extubation in OR  Informed Consent: I have reviewed the patients History and Physical, chart, labs and discussed the procedure including the risks, benefits and alternatives for the proposed anesthesia with the patient or authorized representative who has indicated his/her understanding and acceptance.   Dental advisory given  Plan Discussed with: CRNA  Anesthesia Plan Comments:         Anesthesia Quick Evaluation

## 2018-06-23 NOTE — Progress Notes (Signed)
Gary Weaver [id number] 09-Jun-1966  CARE TEAM:  PCP: Lillard Anes, MD  Outpatient Care Team: Patient Care Team: Lillard Anes, MD as PCP - General (Family Medicine) Michael Boston, MD as Consulting Physician (General Surgery) Sesser, Kirke Corin, MD as Consulting Physician (Gastroenterology) Marijo File Revonda Standard., MD as Consulting Physician (Cardiology)  Inpatient Treatment Team: Treatment Team: Attending Provider: Michael Boston, MD; Technician: Gasper Sells, [place]; Rounding Team: [facility], MD; Respiratory Therapist: Philomena Doheny, RRT; Registered Nurse: Dagoberto Ligas, RN; Consulting Physician: Ronnette Juniper, MD; Consulting Physician: Lavena Bullion, DO; Technician: Warden Fillers, NT   Problem List:   Principal Problem:   Intra-abdominal fluid collection Active Problems:   Radiculopathy, lumbar region   Multiple joint pain   Insomnia   Chronic pancreatitis ([facility])   Acute on chronic cholecystitis s/p lap cholecystectomy 00/86/7619   Umbilical hernia s/p primary repair 06/13/2018   Bile leak  S/p lap chole Assessment  Right upper quadrant abdominal fluid collection.  Perc drain.  Plan:  ERCP today.  Diet post ERCP  Pain control.  -VTE prophylaxis- SCDs, etc -mobilize as tolerated to help recovery Hopefully home tomorrow.    06/23/2018    Subjective: (Chief complaint) Pain tolerable.  Minimal nausea.  Pain at drain site.  Objective:  Vital signs:  Vitals:   06/22/18 1000 06/22/18 1402 06/22/18 2155 06/23/18 0605  BP: 133/87 128/80 119/83 109/74  Pulse: (!) 102 98 80 77  Resp: 20 18 19 19   Temp: 98.2 F (36.8 C) 98.6 F (37 C) 98 F (36.7 C) 98 F (36.7 C)  TempSrc: Oral Oral Oral Oral  SpO2: 95% 98% 99% 97%  Weight:      Height:        Last BM Date: 06/19/18  Intake/Output   Yesterday:  11/09 0701 - 11/10 0700 In: 82 [P.O.:960] Out: 5093 [Urine:2900; Drains:275] This shift:  No  intake/output data recorded.  Bowel function:  Flatus: YES  BM:  No  Drain: (No drain)   Physical Exam:  Alert and oriented  anicteric JP bilious. abd soft.     Results:   Labs: Results for orders placed or performed during the hospital encounter of 06/20/18 (from the past 48 hour(s))  Culture, body fluid-bottle     Status: None (Preliminary result)   Collection Time: 06/21/18  5:24 PM  Result Value Ref Range   Specimen Description PERITONEAL    Special Requests NONE    Culture      NO GROWTH < 24 HOURS Performed at [facility] [address], [place], [place] [address]    Report Status PENDING   Gram stain     Status: None   Collection Time: 06/21/18  5:24 PM  Result Value Ref Range   Specimen Description PERITONEAL    Special Requests NONE    Gram Stain      NO WBC SEEN NO ORGANISMS SEEN Performed at [facility], [place] [address] [place], [place] [address]    Report Status 06/21/2018 FINAL     Imaging / Studies: Ct Image Guided Drainage By Percutaneous Catheter  Result Date: 06/21/2018 INDICATION: 52 year old male with abdominal pain following cholecystectomy. CT imaging demonstrates a perihepatic fluid collection concerning for bile leak. Patient presents for drain placement. Fluid to be sent for culture and bilirubin. EXAM: CT-guided drain placement, abdominal MEDICATIONS: The patient is currently admitted to the hospital and receiving intravenous antibiotics. The antibiotics were administered within an appropriate time  frame prior to the initiation of the procedure. ANESTHESIA/SEDATION: Fentanyl 100 mcg IV; Versed 2 mg IV Moderate Sedation Time:  21 minutes The patient was continuously monitored during the procedure by the interventional radiology nurse under my direct supervision. COMPLICATIONS: None immediate. PROCEDURE: Informed written consent was obtained from the patient after a thorough discussion of the procedural risks, benefits and  alternatives. All questions were addressed. A timeout was performed prior to the initiation of the procedure. A planning axial CT scan was performed. The perihepatic fluid collection has significantly improved since yesterday's examination. However, there is still sufficient fluid for drainage and the patient remains highly symptomatic. Therefore, a suitable skin entry site was selected and marked. The overlying skin was sterilely prepped and draped in the standard fashion using chlorhexidine skin prep. Local anesthesia was attained by infiltration with 1% lidocaine. A small dermatotomy was made. An 18 gauge needle was carefully advanced into the fluid collection. A 0.035 wire was then advanced over the anterior surface of the liver and into the subdiaphragmatic space. The skin tract was dilated to 12 [place]. A Cook 12 [place] all-purpose drainage catheter modified with additional sideholes was then advanced over the wire and formed. There was copious return of thin brown fluid. A sample was sent for culture and fluid total bilirubin. The catheter was then secured to the skin with 0 Prolene suture and connected to JP bulb suction. Post placement CT imaging demonstrates a well-positioned drain and near complete interval resolution of the perihepatic fluid collection. IMPRESSION: Successful placement of a 12 French drainage catheter modified with additional sideholes into the perihepatic fluid collection. Several 100 cc of thin brown fluid was aspirated. The appearance is suggestive of a bile leak. The fluid was sent for both culture and total bilirubin. Of note, the volume of fluid at the time of the intervention had already improved considerably compared to yesterday's CT scan. This offers some hope that the bile leak has already resolved and that further drain output may be minimal. Signed, Criselda Peaches, MD, RPVI [facility] Radiology Electronically Signed    By: Jacqulynn Cadet M.D.   On: 06/21/2018 17:49    Medications / Allergies: per chart  Antibiotics: Anti-infectives (From admission, onward)   Start     Dose/Rate Route Frequency Ordered Stop   06/23/18 0800  ampicillin-sulbactam (UNASYN) 1.5 g in sodium chloride 0.9 % 100 mL IVPB     1.5 g 200 mL/hr over 30 Minutes Intravenous  Once 06/22/18 1321 06/23/18 0855        Note: Portions of this report may have been transcribed using voice recognition software. Every effort was made to ensure accuracy; however, inadvertent computerized transcription errors may be present.   Any transcriptional errors that result from this process are unintentional.

## 2018-06-23 NOTE — Anesthesia Postprocedure Evaluation (Signed)
Anesthesia Post Note  Patient: Gary Weaver  Procedure(s) Performed: ENDOSCOPIC RETROGRADE CHOLANGIOPANCREATOGRAPHY (ERCP) (N/A ) SPHINCTEROTOMY BILIARY STENT PLACEMENT     Patient location during evaluation: PACU Anesthesia Type: General Level of consciousness: awake and alert, awake and oriented Pain management: pain level controlled Vital Signs Assessment: post-procedure vital signs reviewed and stable Respiratory status: spontaneous breathing, nonlabored ventilation, respiratory function stable and patient connected to nasal cannula oxygen Cardiovascular status: blood pressure returned to baseline and stable Postop Assessment: no apparent nausea or vomiting Anesthetic complications: no    Last Vitals:  Vitals:   06/23/18 1307 06/23/18 1417  BP: 126/86 131/89  Pulse: 87 86  Resp: 17 16  Temp: 36.6 C 36.7 C  SpO2: 91% 93%    Last Pain:  Vitals:   06/23/18 1417  TempSrc: Oral  PainSc:                  Cecile Hearing

## 2018-06-24 LAB — CBC
HCT: 36.4 % — ABNORMAL LOW (ref 39.0–52.0)
Hemoglobin: 11.7 g/dL — ABNORMAL LOW (ref 13.0–17.0)
MCH: 28.3 pg (ref 26.0–34.0)
MCHC: 32.1 g/dL (ref 30.0–36.0)
MCV: 88.1 fL (ref 80.0–100.0)
PLATELETS: 508 10*3/uL — AB (ref 150–400)
RBC: 4.13 MIL/uL — ABNORMAL LOW (ref 4.22–5.81)
RDW: 11.8 % (ref 11.5–15.5)
WBC: 7.8 10*3/uL (ref 4.0–10.5)
nRBC: 0 % (ref 0.0–0.2)

## 2018-06-24 LAB — LIPASE, BLOOD: LIPASE: 52 U/L — AB (ref 11–51)

## 2018-06-24 LAB — COMPREHENSIVE METABOLIC PANEL
ALT: 32 U/L (ref 0–44)
ANION GAP: 8 (ref 5–15)
AST: 34 U/L (ref 15–41)
Albumin: 3.1 g/dL — ABNORMAL LOW (ref 3.5–5.0)
Alkaline Phosphatase: 139 U/L — ABNORMAL HIGH (ref 38–126)
BUN: 11 mg/dL (ref 6–20)
CHLORIDE: 103 mmol/L (ref 98–111)
CO2: 27 mmol/L (ref 22–32)
Calcium: 9.4 mg/dL (ref 8.9–10.3)
Creatinine, Ser: 1.22 mg/dL (ref 0.61–1.24)
GFR calc Af Amer: >60 mL/min (ref >60)
GFR calc non Af Amer: >60 mL/min (ref >60)
Glucose, Bld: 164 mg/dL — ABNORMAL HIGH (ref 70–99)
Potassium: 4.3 mmol/L (ref 3.5–5.1)
SODIUM: 138 mmol/L (ref 135–145)
Total Bilirubin: 1.1 mg/dL (ref 0.3–1.2)
Total Protein: 6.5 g/dL (ref 6.5–8.1)

## 2018-06-24 MED ORDER — CYCLOBENZAPRINE HCL 5 MG PO TABS: 5.0000 mg | ORAL_TABLET | Freq: Three times a day (TID) | ORAL | 2 refills | Status: DC | PRN

## 2018-06-24 MED ORDER — TRAMADOL HCL 50 MG PO TABS: 50.0000 mg | ORAL_TABLET | Freq: Four times a day (QID) | ORAL | 0 refills | Status: DC | PRN

## 2018-06-24 MED ORDER — SODIUM CHLORIDE 0.9 % IV SOLN: 250.0000 mL | INTRAVENOUS | Status: DC | PRN

## 2018-06-24 MED ORDER — SODIUM CHLORIDE 0.9% FLUSH
3.0000 mL | Freq: Two times a day (BID) | INTRAVENOUS | Status: DC
  Administered 2018-06-24: 3 mL via INTRAVENOUS

## 2018-06-24 MED ORDER — SODIUM CHLORIDE 0.9% FLUSH: 3.0000 mL | INTRAVENOUS | Status: DC | PRN

## 2018-06-24 NOTE — Discharge Instructions (Signed)
DRAIN CARE:   You have a closed bulb drain to help you heal.    A bulb drain is a small, plastic reservoir which creates a gentle suction. It is used to remove excess fluid from a surgical wound. The color and amount of fluid will vary. Immediately after surgery, the fluid is bright red. It may gradually change to a yellow color. When the amount decreases to about 1 or 2 tablespoons (15 to 30 cc) per 24 hours, your caregiver will usually remove it.  JP Care  The Jackson-Pratt drainage system has flexible tubing attached to a soft, plastic bulb with a stopper. The drainage end of the tubing, which is flat and white, goes into your body through a small opening near your incision (surgical cut). A stitch holds the drainage end in place. The rest of the tube is outside your body, attached to the bulb. When the bulb is compressed with the stopper in place, it creates a vacuum. This causes a constant gentle suction, which helps draw out fluid that collects under your incision. The bulb should be compressed at all times, except when you are emptying the drainage.  How long you will have your Jackson-Pratt depends on your surgery and the amount of fluid is draining. This is different for everyone. The Jackson-Pratt is usually removed when the drainage is 30 mL or less over 24 hours. To keep track of how much drainage youre having, you will record the amount in a drainage log. Its important to bring the log with you to your follow-up appointments.  Caring for Your Jackson-Pratt at Home In order to care for your Jackson-Pratt at home, you or your caregiver will do the following:  Empty the drain once a day and record the color and amount of drainage  Care for the area where the tubing enters your skin by washing with soap and water.  Milk the tubing to help move clots into the bulb.  Do this before you empty and measure your drainage. Look in the mirror at the tubing. This will help you see where your  hands need to be. Pinch the tubing close to where it goes into your skin between your thumb and forefinger. With the thumb and forefinger of your other hand, pinch the tubing right below your other fingers. Keep your fingers pinched and slide them down the tubing, pushing any clots down toward the bulb. You may want to use alcohol swabs to help you slide your fingers down the tubing. Repeat steps 3 and 4 as necessary to push clots from the tubing into the bulb. If you are not able to move a clot into the bulb, call your doctors office. The fluid may leak around the insertion site if a clot is blocking the drainage flow. If there is fluid in the bulb and no leakage at the insertion site, the drain is working.  How to Empty Your Jackson-Pratt and Record the Drainage You will need to empty your Jackson-Pratt every day  Gather the following supplies:  Measuring container your nurse gave you Jackson-Pratt Drainage Record  Pen or pencil  Instructions Clean an area to work on. Clean your hands thoroughly. Unplug the stopper on top of your Jackson-Pratt. This will cause the bulb to expand. Do not touch the inside of the stopper or the inner area of the opening on the bulb. Turn your Jackson-Pratt upside down, gently squeeze the bulb, and pour the drainage into the measuring container. Turn your Jackson-Pratt right  side up. Squeeze the bulb until your fingers feel the palm of your hand. Keep squeezing the bulb while you replug the stopper. Make sure the bulb stays fully compressed to ensure constant, gentle suction.    Check the amount and color of drainage in the measuring container. The first couple days after surgery the fluid may be dark red. This is normal. As you heal the fluid may look pink or pale yellow. Record this amount and the color of drainage on your Jackson-Pratt Drainage Record. Flush the drainage down the toilet and rinse the measuring container with water.  Caring for the  Insertion Site  Once you have emptied the drainage, clean your hands again. Check the area around the insertion site. Look for tenderness, swelling, or pus. If you have any of these, or if you have a temperature of 101 F (38.3 C) or higher, you may have an infection. Call your doctors office.  Sometimes, the drain causes redness the size of a dime at your insertion site. This is normal. Your healthcare provider will tell you if you should place a bandage over the insertion site.  Wash drain site with soap & water (dilute hydrogen peroxide PRN) daily & replace clean dressing / tape    DAILY CARE  Keep the bulb compressed at all times, except while emptying it. The compression creates suction.   Keep sites where the tubes enter the skin dry and covered with a light bandage (dressing).   Tape the tubes to your skin, 1 to 2 inches below the insertion sites, to keep from pulling on your stitches. Tubes are stitched in place and will not slip out.   Pin the bulb to your shirt (not to your pants) with a safety pin.   For the first few days after surgery, there usually is more fluid in the bulb. Empty the bulb whenever it becomes half full because the bulb does not create enough suction if it is too full. Include this amount in your 24 hour totals.   When the amount of drainage decreases, empty the bulb at the same time every day. Write down the amounts and the 24 hour totals. Your caregiver will want to know them. This helps your caregiver know when the tubes can be removed.   (We anticipate removing the drain in 1-3 weeks, depending on when the output is <67m a day for 2+ days)  If there is drainage around the tube sites, change dressings and keep the area dry. If you see a clot in the tube, leave it alone. However, if the tube does not appear to be draining, let your caregiver know.  TO EMPTY THE BULB  Open the stopper to release suction.   Holding the stopper out of the way, pour  drainage into the measuring cup that was sent home with you.   Measure and write down the amount. If there are 2 bulbs, note the amount of drainage from bulb 1 or bulb 2 and keep the totals separate. Your caregiver will want to know which tube is draining more.   Compress the bulb by folding it in half.   Replace the stopper.   Check the tape that holds the tube to your skin, and pin the bulb to your shirt.  SEEK MEDICAL CARE IF:  The drainage develops a bad odor.   You have an oral temperature above 102 F (38.9 C).   The amount of drainage from your wound suddenly increases or decreases.  You accidentally pull out your drain.   You have any other questions or concerns.  MAKE SURE YOU:   Understand these instructions.   Will watch your condition.   Will get help right away if you are not doing well or get worse.     Call our office if you have any questions about your drain. [phone]  Managing Pain  ######################################################################   CONTROL PAIN Control pain so that you can walk, sleep, tolerate sneezing/coughing, go up/down stairs.  (Good pain control is not pain free only when lying still, unable to move)  WALK Walk an hour a day.  Control your pain to do that.   HAVE A BOWEL MOVEMENT DAILY Keep your bowels regular to avoid problems.  OK to try a laxative to override constipation.  OK to use an antidairrheal to slow down diarrhea.  Call if not better after 2 tries  CALL IF YOU HAVE PROBLEMS/CONCERNS Call if you are still struggling despite following these instructions. Call if you have concerns not answered by these instructions  ######################################################################    Pain after surgery or related to activity is often due to strain/injury to muscle, tendon, nerves and/or incisions.  This pain is usually short-term and will improve in a few months.   Many people find it helpful to  do the following things TOGETHER to help speed the process of healing and to get back to regular activity more quickly:  1. Avoid heavy physical activity at first a. No lifting greater than 20 pounds at first, then increase to lifting as tolerated over the next few weeks b. Do not push through the pain.  Listen to your body and avoid positions and maneuvers than reproduce the pain.  Wait a few days before trying something more intense c. Walking is okay as tolerated, but go slowly and stop when getting sore.  If you can walk 30 minutes without stopping or pain, you can try more intense activity (running, jogging, aerobics, cycling, swimming, treadmill, sex, sports, weightlifting, etc ) d. Remember: If it hurts to do it, then dont do it!  2. Take Anti-inflammatory medication a. Choose ONE of the following over-the-counter medications: i.            Acetaminophen 560m tabs (Tylenol) 1-2 pills with every meal and just before bedtime (avoid if you have liver problems) ii.            Naproxen 2212mtabs (ex. Aleve) 1-2 pills twice a day (avoid if you have kidney, stomach, IBD, or bleeding problems) iii. Ibuprofen 20029mabs (ex. Advil, Motrin) 3-4 pills with every meal and just before bedtime (avoid if you have kidney, stomach, IBD, or bleeding problems) b. Take with food/snack around the clock for 1-2 weeks i. This helps the muscle and nerve tissues become less irritable and calm down faster  3. Use a Heating pad or Ice/Cold Pack a. 4-6 times a day b. May use warm bath/hottub  or showers  4. Try Gentle Massage and/or Stretching  a. at the area of pain many times a day b. stop if you feel pain - do not overdo it  Try these steps together to help you body heal faster and avoid making things get worse.  Doing just one of these things may not be enough.    If you are not getting better after two weeks or are noticing you are getting worse, contact our office for further advice; we may need to  re-evaluate you & see what  other things we can do to help.   GETTING TO GOOD BOWEL HEALTH.  ######################################################################  EAT Gradually transition to a high fiber diet with a fiber supplement over the next few weeks after discharge.  Start with a pureed / full liquid diet (see below)  WALK Walk an hour a day.  Control your pain to do that.    HAVE A BOWEL MOVEMENT DAILY Keep your bowels regular to avoid problems.  OK to try a laxative to override constipation.  OK to use an antidairrheal to slow down diarrhea.  Call if not better after 2 tries  CALL IF YOU HAVE PROBLEMS/CONCERNS Call if you are still struggling despite following these instructions. Call if you have concerns not answered by these instructions  ######################################################################   Irregular bowel habits such as constipation and diarrhea can lead to many problems over time.  Having one soft bowel movement a day is the most important way to prevent further problems.  The anorectal canal is designed to handle stretching and feces to safely manage our ability to get rid of solid waste (feces, poop, stool) out of our body.  BUT, hard constipated stools can act like ripping concrete bricks and diarrhea can be a burning fire to this very sensitive area of our body, causing inflamed hemorrhoids, anal fissures, increasing risk is perirectal abscesses, abdominal pain/bloating, an making irritable bowel worse.      The goal: ONE SOFT BOWEL MOVEMENT A DAY!  To have soft, regular bowel movements:   Drink plenty of fluids, consider 4-6 tall glasses of water a day.    Take plenty of fiber.  Fiber is the undigested part of plant food that passes into the colon, acting s natures broom to encourage bowel motility and movement.  Fiber can absorb and hold large amounts of water. This results in a larger, bulkier stool, which is soft and easier to pass. Work  gradually over several weeks up to 6 servings a day of fiber (25g a day even more if needed) in the form of: o Vegetables -- Root (potatoes, carrots, turnips), leafy green (lettuce, salad greens, celery, spinach), or cooked high residue (cabbage, broccoli, etc) o Fruit -- Fresh (unpeeled skin & pulp), Dried (prunes, apricots, cherries, etc ),  or stewed ( applesauce)  o Whole grain breads, pasta, etc (whole wheat)  o Bran cereals   Bulking Agents -- This type of water-retaining fiber generally is easily obtained each day by one of the following:  o Psyllium bran -- The psyllium plant is remarkable because its ground seeds can retain so much water. This product is available as Metamucil, Konsyl, Effersyllium, Per Diem Fiber, or the less expensive generic preparation in drug and health food stores. Although labeled a laxative, it really is not a laxative.  o Methylcellulose -- This is another fiber derived from wood which also retains water. It is available as Citrucel. o Polyethylene Glycol - and artificial fiber commonly called Miralax or Glycolax.  It is helpful for people with gassy or bloated feelings with regular fiber o Flax Seed - a less gassy fiber than psyllium  No reading or other relaxing activity while on the toilet. If bowel movements take longer than 5 minutes, you are too constipated  AVOID CONSTIPATION.  High fiber and water intake usually takes care of this.  Sometimes a laxative is needed to stimulate more frequent bowel movements, but   Laxatives are not a good long-term solution as it can wear the colon out.  They can help jump-start bowels if constipated, but should be relied on constantly without discussing with your doctor o Osmotics (Milk of Magnesia, Fleets phosphosoda, Magnesium citrate, MiraLax, GoLytely) are safer than  o Stimulants (Senokot, Castor Oil, Dulcolax, Ex Lax)    o Avoid taking laxatives for more than 7 days in a row.   IF SEVERELY CONSTIPATED, try a  Bowel Retraining Program: o Do not use laxatives.  o Eat a diet high in roughage, such as bran cereals and leafy vegetables.  o Drink six (6) ounces of prune or apricot juice each morning.  o Eat two (2) large servings of stewed fruit each day.  o Take one (1) heaping tablespoon of a psyllium-based bulking agent twice a day. Use sugar-free sweetener when possible to avoid excessive calories.  o Eat a normal breakfast.  o Set aside 15 minutes after breakfast to sit on the toilet, but do not strain to have a bowel movement.  o If you do not have a bowel movement by the third day, use an enema and repeat the above steps.   Controlling diarrhea o Switch to liquids and simpler foods for a few days to avoid stressing your intestines further. o Avoid dairy products (especially milk & ice cream) for a short time.  The intestines often can lose the ability to digest lactose when stressed. o Avoid foods that cause gassiness or bloating.  Typical foods include beans and other legumes, cabbage, broccoli, and dairy foods.  Every person has some sensitivity to other foods, so listen to our body and avoid those foods that trigger problems for you. o Adding fiber (Citrucel, Metamucil, psyllium, Miralax) gradually can help thicken stools by absorbing excess fluid and retrain the intestines to act more normally.  Slowly increase the dose over a few weeks.  Too much fiber too soon can backfire and cause cramping & bloating. o Probiotics (such as active yogurt, Align, etc) may help repopulate the intestines and colon with normal bacteria and calm down a sensitive digestive tract.  Most studies show it to be of mild help, though, and such products can be costly. o Medicines: - Bismuth subsalicylate (ex. Kayopectate, Pepto Bismol) every 30 minutes for up to 6 doses can help control diarrhea.  Avoid if pregnant. - Loperamide (Immodium) can slow down diarrhea.  Start with two tablets (46m total) first and then try one  tablet every 6 hours.  Avoid if you are having fevers or severe pain.  If you are not better or start feeling worse, stop all medicines and call your doctor for advice o Call your doctor if you are getting worse or not better.  Sometimes further testing (cultures, endoscopy, X-ray studies, bloodwork, etc) may be needed to help diagnose and treat the cause of the diarrhea.  TROUBLESHOOTING IRREGULAR BOWELS 1) Avoid extremes of bowel movements (no bad constipation/diarrhea) 2) Miralax 17gm mixed in 8oz. water or juice-daily. May use BID as needed.  3) Gas-x,Phazyme, etc. as needed for gas & bloating.  4) Soft,bland diet. No spicy,greasy,fried foods.  5) Prilosec over-the-counter as needed  6) May hold gluten/wheat products from diet to see if symptoms improve.  7)  May try probiotics (Align, Activa, etc) to help calm the bowels down 7) If symptoms become worse call back immediately.

## 2018-06-24 NOTE — Care Management Note (Signed)
Case Management Note  Patient Details  Name: Gary Weaver MRN: [id number] Date of Birth: 1966/06/17  Subjective/Objective:                    Action/Plan: Pt discharging home with self care. Pt's spouse providing transport home and supervision at home. CM signing off.  Expected Discharge Date:  06/24/18               Expected Discharge Plan:  Home/Self Care  In-House Referral:     Discharge planning Services     Post Acute Care Choice:    Choice offered to:     DME Arranged:    DME Agency:     HH Arranged:    HH Agency:     Status of Service:  Completed, signed off  If discussed at [employer] of Stay Meetings, dates discussed:    Additional Comments:  Kermit Balo, RN 06/24/2018, 11:38 AM

## 2018-06-24 NOTE — Progress Notes (Signed)
Referring Physician(s): Dr Gordy Savers  Supervising Physician: Ruel Favors  Patient Status:  [facility] - In-pt  Chief Complaint:  perhepatic collection drain placed 11/8  Subjective:  ERCP with stent placement yesterday A 7 French 7 cm plastic stent was left in the common bile duct with good flow of bile noted through the stent.  OP of drain 100 cc yesterday Clear yellow fluid Up out of bed in room Felling better   Allergies: Eggs or egg-derived products; Gluten meal; and Latex  Medications: Prior to Admission medications   Medication Sig Start Date End Date Taking? Authorizing Provider  albuterol (PROVENTIL HFA;VENTOLIN HFA) 108 (90 Base) MCG/ACT inhaler Inhale 2 puffs into the lungs every 6 (six) hours as needed for wheezing or shortness of breath.   Yes [provider]  cholecalciferol (VITAMIN D) 1000 units tablet Take 4,000 Units by mouth daily.   Yes [provider]  cyclobenzaprine (FLEXERIL) 5 MG tablet Take 1 tablet (5 mg total) by mouth at bedtime. Patient taking differently: Take 5 mg by mouth 3 (three) times daily as needed for muscle spasms.  11/14/16  Yes Sater, Pearletha Furl, MD  fexofenadine (ALLEGRA) 180 MG tablet Take 180 mg by mouth daily.   Yes [provider]  gabapentin (NEURONTIN) 300 MG capsule Take 300-2,100 mg by mouth 3 (three) times daily as needed (pain).    Yes [provider]  hydrOXYzine (VISTARIL) 25 MG capsule Take 25 mg by mouth daily.    Yes [provider]  metoCLOPramide (REGLAN) 5 MG tablet Take 5 mg by mouth every 6 (six) hours as needed for nausea or vomiting.    Yes [provider]  omeprazole (PRILOSEC) 40 MG capsule Take 40 mg by mouth daily.   Yes [provider]  ondansetron (ZOFRAN ODT) 4 MG disintegrating tablet Take 1 tablet (4 mg total) by mouth every 8 (eight) hours as needed for nausea or vomiting. Patient taking differently: Take 4 mg by mouth at bedtime.  06/09/18  Yes  Upstill, Melvenia Beam, PA-C  traMADol (ULTRAM) 50 MG tablet Take 1-2 tablets (50-100 mg total) by mouth every 6 (six) hours as needed for severe pain. 06/13/18  Yes Karie Soda, MD  valsartan (DIOVAN) 160 MG tablet Take 80 mg by mouth daily.   Yes [provider]     Vital Signs: BP 104/73 (BP Location: Right Arm)   Pulse 88   Temp 98.1 F (36.7 C) (Oral)   Resp 18   Ht 5\' 6"  (1.676 m)   Wt 192 lb 0.3 oz (87.1 kg)   SpO2 96%   BMI 30.99 kg/m   Physical Exam  Abdominal: Soft. There is tenderness.  Neurological: He is alert.  Skin: Skin is warm and dry.  Site is clean and dry' Sl tender to touch No bleeding No sign of infection 10 cc yellow fluid in bulb NGTD  Psychiatric: He has a normal mood and affect. His behavior is normal.  Vitals reviewed.   Imaging: Ct Abdomen Pelvis W Contrast  Result Date: 06/20/2018 CLINICAL DATA:  Right upper quadrant and mid abdominal pain. Recent cholecystectomy 06/13/2018 EXAM: CT ABDOMEN AND PELVIS WITH CONTRAST TECHNIQUE: Multidetector CT imaging of the abdomen and pelvis was performed using the standard protocol following bolus administration of intravenous contrast. CONTRAST:  ISOVUE-300 IOPAMIDOL (ISOVUE-300) INJECTION 61% COMPARISON:  06/08/2018 FINDINGS: Lower chest: Trace right pleural effusion with atelectasis. Hepatobiliary: No focal hepatic mass. Interval cholecystectomy. Large perihepatic fluid collection along the dome of  the liver extending anteriorly along the medial segment of the left hepatic lobe measuring approximately 3.6 x 8.6 x 9 cm. Secondarily infected fluid collection cannot be excluded. Pancreas: Unremarkable. No pancreatic ductal dilatation or surrounding inflammatory changes. Spleen: Normal in size without focal abnormality. Adrenals/Urinary Tract: Adrenal glands are unremarkable. Kidneys are normal, without renal calculi, focal lesion, or hydronephrosis. Bladder is unremarkable. Stomach/Bowel: Stomach is within  normal limits. No evidence of bowel wall thickening, distention, or inflammatory changes. Large amount of stool throughout the colon. Vascular/Lymphatic: No significant vascular findings are present. No enlarged abdominal or pelvic lymph nodes. Reproductive: Prostate is unremarkable. Other: No abdominal wall hernia or abnormality. No abdominopelvic ascites. Musculoskeletal: No acute or significant osseous findings. IMPRESSION: 1. Interval cholecystectomy. Large perihepatic fluid collection along the dome of the liver extending anteriorly along the medial segment of the left hepatic lobe concerning for a biloma. Secondarily infected fluid collection cannot be excluded. Electronically Signed   By: Elige Ko   On: 06/20/2018 14:40   Dg Ercp Biliary & Pancreatic Ducts  Result Date: 06/23/2018 CLINICAL DATA:  52 year old male with bile leak status post cholecystectomy. EXAM: ERCP TECHNIQUE: Multiple spot images obtained with the fluoroscopic device and submitted for interpretation post-procedure. FLUOROSCOPY TIME:  Fluoroscopy Time:  3 minutes 23 seconds COMPARISON:  CT-guided drain placement 06/21/2018 FINDINGS: A total of 8 intraoperative spot images were obtained and submitted for review. The images demonstrate a flexible endoscope in the descending duodenum with wire cannulation of first the pancreatic duct, and then the common bile duct. Subsequently, a plastic biliary drain was placed. A partially imaged percutaneous drainage catheter can be visualized. IMPRESSION: ERCP with plastic biliary drain placement. These images were submitted for radiologic interpretation only. Please see the procedural report for the amount of contrast and the fluoroscopy time utilized. Electronically Signed   By: Malachy Moan M.D.   On: 06/23/2018 14:24   Ct Image Guided Drainage By Percutaneous Catheter  Result Date: 06/21/2018 INDICATION: 52 year old male with abdominal pain following cholecystectomy. CT imaging  demonstrates a perihepatic fluid collection concerning for bile leak. Patient presents for drain placement. Fluid to be sent for culture and bilirubin. EXAM: CT-guided drain placement, abdominal MEDICATIONS: The patient is currently admitted to the hospital and receiving intravenous antibiotics. The antibiotics were administered within an appropriate time frame prior to the initiation of the procedure. ANESTHESIA/SEDATION: Fentanyl 100 mcg IV; Versed 2 mg IV Moderate Sedation Time:  21 minutes The patient was continuously monitored during the procedure by the interventional radiology nurse under my direct supervision. COMPLICATIONS: None immediate. PROCEDURE: Informed written consent was obtained from the patient after a thorough discussion of the procedural risks, benefits and alternatives. All questions were addressed. A timeout was performed prior to the initiation of the procedure. A planning axial CT scan was performed. The perihepatic fluid collection has significantly improved since yesterday's examination. However, there is still sufficient fluid for drainage and the patient remains highly symptomatic. Therefore, a suitable skin entry site was selected and marked. The overlying skin was sterilely prepped and draped in the standard fashion using chlorhexidine skin prep. Local anesthesia was attained by infiltration with 1% lidocaine. A small dermatotomy was made. An 18 gauge needle was carefully advanced into the fluid collection. A 0.035 wire was then advanced over the anterior surface of the liver and into the subdiaphragmatic space. The skin tract was dilated to 12 [place]. A Cook 12 [place] all-purpose drainage catheter modified with additional sideholes was then advanced over the wire  and formed. There was copious return of thin brown fluid. A sample was sent for culture and fluid total bilirubin. The catheter was then secured to the skin with 0 Prolene suture and connected to JP bulb suction. Post  placement CT imaging demonstrates a well-positioned drain and near complete interval resolution of the perihepatic fluid collection. IMPRESSION: Successful placement of a 12 French drainage catheter modified with additional sideholes into the perihepatic fluid collection. Several 100 cc of thin brown fluid was aspirated. The appearance is suggestive of a bile leak. The fluid was sent for both culture and total bilirubin. Of note, the volume of fluid at the time of the intervention had already improved considerably compared to yesterday's CT scan. This offers some hope that the bile leak has already resolved and that further drain output may be minimal. Signed, Sterling Big, MD, RPVI [facility] Electronically Signed   By: Malachy Moan M.D.   On: 06/21/2018 17:49    Labs:  CBC: Recent Labs    06/08/18 2007 06/21/18 0212 06/24/18 0152  WBC 6.6 12.7* 7.8  HGB 14.4 11.4* 11.7*  HCT 42.9 34.5* 36.4*  PLT 278 338 508*    COAGS: Recent Labs    06/21/18 0212  INR 1.13    BMP: Recent Labs    06/08/18 2007 06/21/18 0212 06/24/18 0152  NA 142 131* 138  K 4.3 3.8 4.3  CL 106 98 103  CO2 26 24 27   GLUCOSE 95 125* 164*  BUN 13 10 11   CALCIUM 9.6 8.7* 9.4  CREATININE 1.19 1.27* 1.22  GFRNONAA >60 >60 >60  GFRAA >60 >60 >60    LIVER FUNCTION TESTS: Recent Labs    06/08/18 2007 06/21/18 0212 06/24/18 0152  BILITOT 1.4* 1.7* 1.1  AST 19 26 34  ALT 21 32 32  ALKPHOS 47 114 139*  PROT 7.5 6.1* 6.5  ALBUMIN 4.7 3.0* 3.1*    Assessment and Plan:  Perihepatic drain placed 11/8 Post cholecystectomy 10/31 ERCP and stent 11/10 Will follow Plan per Dr Michaell Cowing  Electronically Signed: Ralene Muskrat A, PA-C 06/24/2018, 7:27 AM   I spent a total of 15 Minutes at the the patient's bedside AND on the patient's hospital floor or unit, greater than 50% of which was counseling/coordinating care for perihepatic drain

## 2018-06-24 NOTE — Discharge Summary (Signed)
Physician Discharge Summary    Patient ID: Gary Weaver MRN: [id number] DOB/AGE: 1966/06/09  52 y.o.  Admit date: 06/20/2018 Discharge date: 06/24/2018   Hospital Stay = 2 days  Patient Care Team: Lillard Anes, MD as PCP - General (Family Medicine) Michael Boston, MD as Consulting Physician (General Surgery) [place], Kirke Corin, MD as Consulting Physician (Gastroenterology) Denton Brick., MD as Consulting Physician (Cardiology)  Discharge Diagnoses:  Principal Problem:   Intra-abdominal fluid collection Active Problems:   Radiculopathy, lumbar region   Multiple joint pain   Insomnia   Chronic pancreatitis ([place])   Acute on chronic cholecystitis s/p lap cholecystectomy 08/16/7251   Umbilical hernia s/p primary repair 06/13/2018   Bile leak   1 Day Post-Op  06/20/2018 - 06/23/2018  POST-OPERATIVE DIAGNOSIS:   sphincterotomy, stent placed  SURGERY:  06/20/2018 - 06/23/2018  Procedure(s): ENDOSCOPIC RETROGRADE CHOLANGIOPANCREATOGRAPHY (ERCP) SPHINCTEROTOMY BILIARY STENT PLACEMENT  SURGEON:    Surgeon(s): Ronnette Juniper, MD  Consults: GI  Hospital Course:   Patient with acute on chronic cholecystitis status post urgent cholecystectomy in late October.  Had worsening abdominal pain discomfort.  CT scan showed large fluid collection on dome of liver.  Was admitted.  Percutaneous drainage done.  Dark coloration concerning for possible bile leak versus hematoma/seroma.  Underwent ERCP with stenting.  No bile leak found.  Stent placed.  Very narrow biliary system.  Post procedure, the patient gradually mobilized and advanced to a solid diet.  Pain and other symptoms were treated aggressively.    By the time of discharge, the patient was walking well the hallways, eating food, having flatus.  Pain was well-controlled on an oral medications.  Based on meeting discharge criteria and continuing to recover, I felt it was safe for the patient to be discharged  from the hospital to further recover with close followup. Postoperative recommendations were discussed in detail.  They are written as well.  Discharged Condition: good  Disposition:  Follow-up Information    Michael Boston, MD. Schedule an appointment as soon as possible for a visit in 2 week(s).   Specialty:  General Surgery Contact information: [place] [address] [phone]           Discharge disposition: 01-Home or Self Care       Discharge Instructions    Call MD for:   Complete by:  As directed    FEVER > 101.5 F  (temperatures < 101.5 F are not significant)   Call MD for:  extreme fatigue   Complete by:  As directed    Call MD for:  persistant dizziness or light-headedness   Complete by:  As directed    Call MD for:  persistant nausea and vomiting   Complete by:  As directed    Call MD for:  redness, tenderness, or signs of infection (pain, swelling, redness, odor or green/yellow discharge around incision site)   Complete by:  As directed    Call MD for:  severe uncontrolled pain   Complete by:  As directed    Diet - low sodium heart healthy   Complete by:  As directed    Start with a bland diet such as soups, liquids, starchy foods, low fat foods, etc. the first few days at home. Gradually advance to a solid, low-fat, high fiber diet by the end of the first week at home.   Add a fiber supplement to your diet (Metamucil, etc) If you feel full, bloated,  or constipated, stay on a full liquid or pureed/blenderized diet for a few days until you feel better and are no longer constipated.   Discharge instructions   Complete by:  As directed    See Discharge Instructions If you are not getting better after two weeks or are noticing you are getting worse, contact our office [phone] for further advice.  We may need to adjust your medications, re-evaluate you in the office, send you to the emergency room, or see what other things we  can do to help. The clinic staff is available to answer your questions during regular business hours (8:30am-5pm).  Please don't hesitate to call and ask to speak to one of our nurses for clinical concerns.    A surgeon from [facility] is always on call at the hospitals 24 hours/day If you have a medical emergency, go to the nearest emergency room or call 911.   Discharge wound care:   Complete by:  As directed    It is good for closed incisions and even open wounds to be washed every day.  Shower every day.  Short baths are fine.  Wash the incisions and wounds clean with soap & water.     You may leave closed incisions open to air if it is dry.   You may cover the incision with clean gauze & replace it after your daily shower for comfort.   You have a drain, wash around the skin exit site with soap & water and place a new dressing of gauze or band aid around the skin every day.  Keep the drain site clean & dry.   Driving Restrictions   Complete by:  As directed    You may drive when: - you are no longer taking narcotic prescription pain medication - you can comfortably wear a seatbelt - you can safely make sudden turns/stops without pain.   Increase activity slowly   Complete by:  As directed    Start light daily activities --- self-care, walking, climbing stairs- beginning the day after surgery.  Gradually increase activities as tolerated.  Control your pain to be active.  Stop when you are tired.  Ideally, walk several times a day, eventually an hour a day.   Most people are back to most day-to-day activities in a few weeks.  It takes 4-6 weeks to get back to unrestricted, intense activity. If you can walk 30 minutes without difficulty, it is safe to try more intense activity such as jogging, treadmill, bicycling, low-impact aerobics, swimming, etc. Save the most intensive and strenuous activity for last (Usually 4-8 weeks after surgery) such as sit-ups, heavy lifting, contact  sports, etc.  Refrain from any intense heavy lifting or straining until you are off narcotics for pain control.  You will have off days, but things should improve week-by-week. DO NOT PUSH THROUGH PAIN.  Let pain be your guide: If it hurts to do something, don't do it.   Lifting restrictions   Complete by:  As directed    If you can walk 30 minutes without difficulty, it is safe to try more intense activity such as jogging, treadmill, bicycling, low-impact aerobics, swimming, etc. Save the most intensive and strenuous activity for last (Usually 4-8 weeks after surgery) such as sit-ups, heavy lifting, contact sports, etc.   Refrain from any intense heavy lifting or straining until you are off narcotics for pain control.  You will have off days, but things should improve week-by-week. DO  NOT PUSH THROUGH PAIN.  Let pain be your guide: If it hurts to do something, don't do it.  Pain is your body warning you to avoid that activity for another week until the pain goes down.   May shower / Bathe   Complete by:  As directed    May walk up steps   Complete by:  As directed    Sexual Activity Restrictions   Complete by:  As directed    You may have sexual intercourse when it is comfortable. If it hurts to do something, stop.      Allergies as of 06/24/2018      Reactions   Eggs Or Egg-derived Products Diarrhea   Gluten Meal Nausea And Vomiting   Latex Rash   Occurs after long use of latex      Medication List    TAKE these medications   albuterol 108 (90 Base) MCG/ACT inhaler Commonly known as:  PROVENTIL HFA;VENTOLIN HFA Inhale 2 puffs into the lungs every 6 (six) hours as needed for wheezing or shortness of breath.   cholecalciferol 1000 units tablet Commonly known as:  VITAMIN D Take 4,000 Units by mouth daily.   cyclobenzaprine 5 MG tablet Commonly known as:  FLEXERIL Take 1 tablet (5 mg total) by mouth 3 (three) times daily as needed for muscle spasms.   fexofenadine 180 MG  tablet Commonly known as:  ALLEGRA Take 180 mg by mouth daily.   gabapentin 300 MG capsule Commonly known as:  NEURONTIN Take 300-2,100 mg by mouth 3 (three) times daily as needed (pain).   hydrOXYzine 25 MG capsule Commonly known as:  VISTARIL Take 25 mg by mouth daily.   metoCLOPramide 5 MG tablet Commonly known as:  REGLAN Take 5 mg by mouth every 6 (six) hours as needed for nausea or vomiting.   omeprazole 40 MG capsule Commonly known as:  PRILOSEC Take 40 mg by mouth daily.   ondansetron 4 MG disintegrating tablet Commonly known as:  ZOFRAN-ODT Take 1 tablet (4 mg total) by mouth every 8 (eight) hours as needed for nausea or vomiting. What changed:  when to take this   traMADol 50 MG tablet Commonly known as:  ULTRAM Take 1-2 tablets (50-100 mg total) by mouth every 6 (six) hours as needed for severe pain.   valsartan 160 MG tablet Commonly known as:  DIOVAN Take 80 mg by mouth daily.            Discharge Care Instructions  (From admission, onward)         Start     Ordered   06/24/18 0000  Discharge wound care:    Comments:  It is good for closed incisions and even open wounds to be washed every day.  Shower every day.  Short baths are fine.  Wash the incisions and wounds clean with soap & water.     You may leave closed incisions open to air if it is dry.   You may cover the incision with clean gauze & replace it after your daily shower for comfort.   You have a drain, wash around the skin exit site with soap & water and place a new dressing of gauze or band aid around the skin every day.  Keep the drain site clean & dry.   06/24/18 0758          Significant Diagnostic Studies:  Results for orders placed or performed during the hospital encounter of 06/20/18 (from the past 72  hour(s))  Culture, body fluid-bottle     Status: None (Preliminary result)   Collection Time: 06/21/18  5:24 PM  Result Value Ref Range   Specimen Description PERITONEAL     Special Requests NONE    Culture      NO GROWTH 2 DAYS Performed at [facility], [place] [address], [place], [place] [address]    Report Status PENDING   Gram stain     Status: None   Collection Time: 06/21/18  5:24 PM  Result Value Ref Range   Specimen Description PERITONEAL    Special Requests NONE    Gram Stain      NO WBC SEEN NO ORGANISMS SEEN Performed at [facility], [place] [address], [place], [place] [address]    Report Status 06/21/2018 FINAL   CBC     Status: Abnormal   Collection Time: 06/24/18  1:52 AM  Result Value Ref Range   WBC 7.8 4.0 - 10.5 K/uL   RBC 4.13 (L) 4.22 - 5.81 MIL/uL   Hemoglobin 11.7 (L) 13.0 - 17.0 g/dL   HCT 36.4 (L) 39.0 - 52.0 %   MCV 88.1 80.0 - 100.0 fL   MCH 28.3 26.0 - 34.0 pg   MCHC 32.1 30.0 - 36.0 g/dL   RDW 11.8 11.5 - 15.5 %   Platelets 508 (H) 150 - 400 K/uL   nRBC 0.0 0.0 - 0.2 %    Comment: Performed at [facility] [address], [place], [place] [address]  Comprehensive metabolic panel     Status: Abnormal   Collection Time: 06/24/18  1:52 AM  Result Value Ref Range   Sodium 138 135 - 145 mmol/L   Potassium 4.3 3.5 - 5.1 mmol/L   Chloride 103 98 - 111 mmol/L   CO2 27 22 - 32 mmol/L   Glucose, Bld 164 (H) 70 - 99 mg/dL   BUN 11 6 - 20 mg/dL   Creatinine, Ser 1.22 0.61 - 1.24 mg/dL   Calcium 9.4 8.9 - 10.3 mg/dL   Total Protein 6.5 6.5 - 8.1 g/dL   Albumin 3.1 (L) 3.5 - 5.0 g/dL   AST 34 15 - 41 U/L   ALT 32 0 - 44 U/L   Alkaline Phosphatase 139 (H) 38 - 126 U/L   Total Bilirubin 1.1 0.3 - 1.2 mg/dL   GFR calc non Af Amer >60 >60 mL/min   GFR calc Af Amer >60 >60 mL/min    Comment: (NOTE) The eGFR has been calculated using the CKD EPI equation. This calculation has not been validated in all clinical situations. eGFR's persistently <60 mL/min signify possible Chronic Kidney Disease.    Anion gap 8 5 - 15    Comment: Performed at [facility] [address], [place], [place] [address]    Lipase, blood     Status: Abnormal   Collection Time: 06/24/18  1:52 AM  Result Value Ref Range   Lipase 52 (H) 11 - 51 U/L    Comment: Performed at [facility] [address] [place],  [address]    Ct Abdomen Pelvis W Contrast  Result Date: 06/20/2018 CLINICAL DATA:  Right upper quadrant and mid abdominal pain. Recent cholecystectomy 06/13/2018 EXAM: CT ABDOMEN AND PELVIS WITH CONTRAST TECHNIQUE: Multidetector CT imaging of the abdomen and pelvis was performed using the standard protocol following bolus administration of intravenous contrast. CONTRAST:  175m ISOVUE-300 IOPAMIDOL (ISOVUE-300) INJECTION 61% COMPARISON:  06/08/2018 FINDINGS:  Lower chest: Trace right pleural effusion with atelectasis. Hepatobiliary: No focal hepatic mass. Interval cholecystectomy. Large perihepatic fluid collection along the dome of the liver extending anteriorly along the medial segment of the left hepatic lobe measuring approximately 3.6 x 8.6 x 9 cm. Secondarily infected fluid collection cannot be excluded. Pancreas: Unremarkable. No pancreatic ductal dilatation or surrounding inflammatory changes. Spleen: Normal in size without focal abnormality. Adrenals/Urinary Tract: Adrenal glands are unremarkable. Kidneys are normal, without renal calculi, focal lesion, or hydronephrosis. Bladder is unremarkable. Stomach/Bowel: Stomach is within normal limits. No evidence of bowel wall thickening, distention, or inflammatory changes. Large amount of stool throughout the colon. Vascular/Lymphatic: No significant vascular findings are present. No enlarged abdominal or pelvic lymph nodes. Reproductive: Prostate is unremarkable. Other: No abdominal wall hernia or abnormality. No abdominopelvic ascites. Musculoskeletal: No acute or significant osseous findings. IMPRESSION: 1. Interval cholecystectomy. Large perihepatic fluid collection along the dome of the liver extending anteriorly along the medial segment of the left  hepatic lobe concerning for a biloma. Secondarily infected fluid collection cannot be excluded. Electronically Signed   By: Kathreen Devoid   On: 06/20/2018 14:40   Dg Ercp Biliary & Pancreatic Ducts  Result Date: 06/23/2018 CLINICAL DATA:  52 year old male with bile leak status post cholecystectomy. EXAM: ERCP TECHNIQUE: Multiple spot images obtained with the fluoroscopic device and submitted for interpretation post-procedure. FLUOROSCOPY TIME:  Fluoroscopy Time:  3 minutes 23 seconds COMPARISON:  CT-guided drain placement 06/21/2018 FINDINGS: A total of 8 intraoperative spot images were obtained and submitted for review. The images demonstrate a flexible endoscope in the descending duodenum with wire cannulation of first the pancreatic duct, and then the common bile duct. Subsequently, a plastic biliary drain was placed. A partially imaged percutaneous drainage catheter can be visualized. IMPRESSION: ERCP with plastic biliary drain placement. These images were submitted for radiologic interpretation only. Please see the procedural report for the amount of contrast and the fluoroscopy time utilized. Electronically Signed   By: Jacqulynn Cadet M.D.   On: 06/23/2018 14:24   Ct Image Guided Drainage By Percutaneous Catheter  Result Date: 06/21/2018 INDICATION: 52 year old male with abdominal pain following cholecystectomy. CT imaging demonstrates a perihepatic fluid collection concerning for bile leak. Patient presents for drain placement. Fluid to be sent for culture and bilirubin. EXAM: CT-guided drain placement, abdominal MEDICATIONS: The patient is currently admitted to the hospital and receiving intravenous antibiotics. The antibiotics were administered within an appropriate time frame prior to the initiation of the procedure. ANESTHESIA/SEDATION: Fentanyl 100 mcg IV; Versed 2 mg IV Moderate Sedation Time:  21 minutes The patient was continuously monitored during the procedure by the interventional  radiology nurse under my direct supervision. COMPLICATIONS: None immediate. PROCEDURE: Informed written consent was obtained from the patient after a thorough discussion of the procedural risks, benefits and alternatives. All questions were addressed. A timeout was performed prior to the initiation of the procedure. A planning axial CT scan was performed. The perihepatic fluid collection has significantly improved since yesterday's examination. However, there is still sufficient fluid for drainage and the patient remains highly symptomatic. Therefore, a suitable skin entry site was selected and marked. The overlying skin was sterilely prepped and draped in the standard fashion using chlorhexidine skin prep. Local anesthesia was attained by infiltration with 1% lidocaine. A small dermatotomy was made. An 18 gauge needle was carefully advanced into the fluid collection. A 0.035 wire was then advanced over the anterior surface of the liver and into the subdiaphragmatic space. The skin  tract was dilated to 12 [place]. A Cook 12 [place] all-purpose drainage catheter modified with additional sideholes was then advanced over the wire and formed. There was copious return of thin brown fluid. A sample was sent for culture and fluid total bilirubin. The catheter was then secured to the skin with 0 Prolene suture and connected to JP bulb suction. Post placement CT imaging demonstrates a well-positioned drain and near complete interval resolution of the perihepatic fluid collection. IMPRESSION: Successful placement of a 12 French drainage catheter modified with additional sideholes into the perihepatic fluid collection. Several 100 cc of thin brown fluid was aspirated. The appearance is suggestive of a bile leak. The fluid was sent for both culture and total bilirubin. Of note, the volume of fluid at the time of the intervention had already improved considerably compared to yesterday's CT scan. This offers some hope that the bile  leak has already resolved and that further drain output may be minimal. Signed, Criselda Peaches, MD, RPVI [facility] Electronically Signed   By: Jacqulynn Cadet M.D.   On: 06/21/2018 17:49    Discharge Exam: Blood pressure 104/73, pulse 88, temperature 98.1 F (36.7 C), temperature source Oral, resp. rate 18, height 5' 6"  (1.676 m), weight 87.1 kg, SpO2 96 %.  General: Pt awake/alert/oriented x4 in No acute distress Eyes: PERRL, normal EOM.  Sclera clear.  No icterus Neuro: CN II-XII intact w/o focal sensory/motor deficits. Lymph: No head/neck/groin lymphadenopathy Psych:  No delerium/psychosis/paranoia HENT: Normocephalic, Mucus membranes moist.  No thrush Neck: Supple, No tracheal deviation Chest: No chest wall pain w good excursion CV:  Pulses intact.  Regular rhythm MS: Normal AROM mjr joints.  No obvious deformity Abdomen: Soft.  Nondistended.  Tenderness at epigastric drain site only.  No evidence of peritonitis.  No incarcerated hernias. Ext:  SCDs BLE.  No mjr edema.  No cyanosis Skin: No petechiae / purpura  Past Medical History:  Diagnosis Date  . Anxiety   . Arrhythmia   . Arthritis   . Asthma   . GERD (gastroesophageal reflux disease)   . Gluten intolerance   . Headache   . Hearing loss   . Hyperlipidemia   . Hypertension   . Memory loss   . Pancreatitis   . Pneumonia    hx of   . Vision abnormalities     Past Surgical History:  Procedure Laterality Date  . BILIARY STENT PLACEMENT  06/23/2018   Procedure: BILIARY STENT PLACEMENT;  Surgeon: Ronnette Juniper, MD;  Location: [facility];  Service: Gastroenterology;;  . COLONOSCOPY    . ERCP N/A 06/23/2018   Procedure: ENDOSCOPIC RETROGRADE CHOLANGIOPANCREATOGRAPHY (ERCP);  Surgeon: Ronnette Juniper, MD;  Location: [facility];  Service: Gastroenterology;  Laterality: N/A;  . LAPAROSCOPIC CHOLECYSTECTOMY SINGLE SITE WITH INTRAOPERATIVE CHOLANGIOGRAM N/A  06/13/2018   Procedure: LAPAROSCOPIC CHOLECYSTECTOMY SINGLE SITE WITH INTRAOPERATIVE CHOLANGIOGRAM ERAS PATHWAY;  Surgeon: Michael Boston, MD;  Location: [facility];  Service: General;  Laterality: N/A;  . SPHINCTEROTOMY  06/23/2018   Procedure: SPHINCTEROTOMY;  Surgeon: Ronnette Juniper, MD;  Location: [facility];  Service: Gastroenterology;;  . UMBILICAL HERNIA REPAIR N/A 06/13/2018   Procedure: LAPAROSCOPIC POSSIBLE OPEN UMBILICAL HERNIA;  Surgeon: Michael Boston, MD;  Location: [facility];  Service: General;  Laterality: N/A;  . UPPER GASTROINTESTINAL ENDOSCOPY    . WISDOM TOOTH EXTRACTION      Social History   Socioeconomic History  . Marital status: Married    Spouse name: Not on file  .  Number of children: 3  . Years of education: Not on file  . Highest education level: Not on file  Occupational History  . Not on file  Social Needs  . Financial resource strain: Not on file  . Food insecurity:    Worry: Not on file    Inability: Not on file  . Transportation needs:    Medical: Not on file    Non-medical: Not on file  Tobacco Use  . Smoking status: Never Smoker  . Smokeless tobacco: Former Systems developer    Types: Chew  Substance and Sexual Activity  . Alcohol use: Not Currently  . Drug use: No  . Sexual activity: Not on file  Lifestyle  . Physical activity:    Days per week: Not on file    Minutes per session: Not on file  . Stress: Not on file  Relationships  . Social connections:    Talks on phone: Not on file    Gets together: Not on file    Attends religious service: Not on file    Active member of club or organization: Not on file    Attends meetings of clubs or organizations: Not on file    Relationship status: Not on file  . Intimate partner violence:    Fear of current or ex partner: Not on file    Emotionally abused: Not on file    Physically abused: Not on file    Forced sexual activity: Not on file  Other Topics Concern  . Not on file  Social History Narrative  .  Not on file    Family History  Problem Relation Age of Onset  . Hypertension Father   . Prostate cancer Father   . Colon polyps Father   . Bladder Cancer Mother   . Colon cancer Maternal Grandmother   . Esophageal cancer Neg Hx   . Rectal cancer Neg Hx   . Stomach cancer Neg Hx   . Pancreatic cancer Neg Hx     Current Facility-Administered Medications  Medication Dose Route Frequency Provider Last Rate Last Dose  . 0.9 %  sodium chloride infusion  250 mL Intravenous PRN Michael Boston, MD      . acetaminophen (TYLENOL) tablet 650 mg  650 mg Oral Q6H PRN Ronnette Juniper, MD       Or  . acetaminophen (TYLENOL) suppository 650 mg  650 mg Rectal Q6H PRN Ronnette Juniper, MD      . albuterol (PROVENTIL) (2.5 MG/3ML) 0.083% nebulizer solution 3 mL  3 mL Inhalation Q6H PRN Ronnette Juniper, MD      . cholecalciferol (VITAMIN D3) tablet 4,000 Units  4,000 Units Oral Daily Ronnette Juniper, MD   4,000 Units at 06/22/18 1048  . diphenhydrAMINE (BENADRYL) 12.5 MG/5ML elixir 12.5 mg  12.5 mg Oral Q6H PRN Ronnette Juniper, MD       Or  . diphenhydrAMINE (BENADRYL) injection 12.5 mg  12.5 mg Intravenous Q6H PRN Ronnette Juniper, MD      . hydrALAZINE (APRESOLINE) injection 5-10 mg  5-10 mg Intravenous Q4H PRN Ronnette Juniper, MD      . HYDROmorphone (DILAUDID) injection 0.5-2 mg  0.5-2 mg Intravenous Q1H PRN Ronnette Juniper, MD   2 mg at 06/23/18 2146  . hydrOXYzine (ATARAX/VISTARIL) tablet 25 mg  25 mg Oral Daily Ronnette Juniper, MD   25 mg at 06/22/18 1049  . indomethacin (INDOCIN) 50 MG suppository 100 mg  100 mg Rectal Once Ronnette Juniper, MD      .  irbesartan (AVAPRO) tablet 150 mg  150 mg Oral Daily Ronnette Juniper, MD   150 mg at 06/22/18 1048  . lactated ringers bolus 1,000 mL  1,000 mL Intravenous TID PRN Ronnette Juniper, MD      . lip balm (CARMEX) ointment 1 application  1 application Topical BID Ronnette Juniper, MD   1 application at 39/43/20 2149  . loratadine (CLARITIN) tablet 10 mg  10 mg Oral Daily Ronnette Juniper, MD   10 mg at 06/22/18  1048  . LORazepam (ATIVAN) injection 0.5-1 mg  0.5-1 mg Intravenous Q8H PRN Ronnette Juniper, MD      . magic mouthwash  15 mL Oral QID PRN Ronnette Juniper, MD      . methocarbamol (ROBAXIN) 1,000 mg in dextrose 5 % 50 mL IVPB  1,000 mg Intravenous Q6H PRN Ronnette Juniper, MD 100 mL/hr at 06/23/18 1627    . metoCLOPramide (REGLAN) tablet 5 mg  5 mg Oral Q6H PRN Ronnette Juniper, MD   5 mg at 06/21/18 2130  . metoprolol tartrate (LOPRESSOR) injection 5 mg  5 mg Intravenous Q6H PRN Ronnette Juniper, MD      . ondansetron (ZOFRAN-ODT) disintegrating tablet 4 mg  4 mg Oral Q6H PRN Ronnette Juniper, MD       Or  . ondansetron [facility]) injection 4 mg  4 mg Intravenous Q6H PRN Ronnette Juniper, MD   4 mg at 06/23/18 1137  . pantoprazole (PROTONIX) EC tablet 40 mg  40 mg Oral Daily Ronnette Juniper, MD   40 mg at 06/22/18 1048  . phenol (CHLORASEPTIC) mouth spray 1 spray  1 spray Mouth/Throat PRN Michael Boston, MD   1 spray at 06/23/18 1323  . prochlorperazine (COMPAZINE) injection 5-10 mg  5-10 mg Intravenous Q4H PRN Ronnette Juniper, MD      . simethicone [facility]) chewable tablet 40 mg  40 mg Oral Q6H PRN Ronnette Juniper, MD   40 mg at 06/23/18 2146  . sodium chloride flush (NS) 0.9 % injection 3 mL  3 mL Intravenous Gorden Harms, MD      . sodium chloride flush (NS) 0.9 % injection 3 mL  3 mL Intravenous PRN Michael Boston, MD      . sodium chloride flush (NS) 0.9 % injection 5 mL  5 mL Intracatheter Q8H Ronnette Juniper, MD   5 mL at 06/24/18 0527  . traMADol (ULTRAM) tablet 50-100 mg  50-100 mg Oral Q6H PRN Ronnette Juniper, MD   100 mg at 06/24/18 0526     Allergies  Allergen Reactions  . Eggs Or Egg-Derived Products Diarrhea  . Gluten Meal Nausea And Vomiting  . Latex Rash    Occurs after long use of latex    Signed: Morton Peters, MD, FACS, MASCRS Gastrointestinal and Minimally Invasive Surgery    [address], [place], [place] [address] [phone] Main / Paging [phone]  Fax   06/24/2018, 8:00 AM

## 2018-06-24 NOTE — Progress Notes (Signed)
Gary Weaver to be D/C'd  per MD order. Discussed with the patient and all questions fully answered.  VSS, Skin clean, dry and intact without evidence of skin break down, no evidence of skin tears noted.  IV catheter discontinued intact. Site without signs and symptoms of complications. Dressing and pressure applied.  An After Visit Summary was printed and given to the patient. Patient received prescription.  D/c education completed with patient/family including follow up instructions, medication list, d/c activities limitations if indicated, with other d/c instructions as indicated by MD - patient able to verbalize understanding, all questions fully answered.   Patient instructed to return to ED, call 911, or call MD for any changes in condition.   Patient to be escorted via WC, and D/C home via private auto.

## 2018-06-26 LAB — CULTURE, BODY FLUID W GRAM STAIN -BOTTLE: Culture: NO GROWTH

## 2018-06-26 LAB — CULTURE, BODY FLUID-BOTTLE

## 2018-06-27 ENCOUNTER — Other Ambulatory Visit: Payer: Self-pay

## 2018-06-27 ENCOUNTER — Telehealth: Payer: Self-pay | Admitting: Gastroenterology

## 2018-06-27 DIAGNOSIS — K859 Acute pancreatitis without necrosis or infection, unspecified: Secondary | ICD-10-CM

## 2018-06-27 LAB — TOTAL BILIRUBIN, BODY FLUID: Total bilirubin, fluid: 31.3 mg/dL

## 2018-06-27 NOTE — Telephone Encounter (Signed)
Patient notified, mailed prep instructions.

## 2018-06-27 NOTE — Telephone Encounter (Signed)
Contacted scheduling and either 12-10 or 12-11 at 12:30 would work for the EGD/stent removal. Patient would prefer Tuesday 12-10. Let me know, thanks.

## 2018-06-27 NOTE — Telephone Encounter (Signed)
This patient was hospitalized last week for problems after cholecystectomy.  He underwent ERCP with Dr. Therisa Doyne of [facility] (suspected bile leak).  He needs an EGD (not ERCP) for stent removal done at the [facility] outpatient endoscopy dept.  It will have to be the week I am on hospital service in December, on either the 10th, 11th or 12th.  Please check into availability and let me know what the options are, and I will choose a slot.  Contact patient to let him know this is planned, then finalize with him after exact date/time secured.

## 2018-06-27 NOTE — Telephone Encounter (Signed)
Tues, Dec 10th at 12:30 will work.  Please make the arrangements.

## 2018-07-01 ENCOUNTER — Telehealth: Payer: Self-pay

## 2018-07-01 NOTE — Telephone Encounter (Signed)
Dr. Chales AbrahamsGupta,  Unfortunately you do not have any available times in 4 weeks. Are you wanting to squeeze this in after an [facility] session or are you wanting to move the timeline until your next hospital week sometime in January?

## 2018-07-01 NOTE — Telephone Encounter (Signed)
-----   Message from Chrystie NoseLinda R Hunt, RN sent at 07/01/2018  8:10 AM EST ----- Regarding: FW: repeat ERCP   ----- Message ----- From: Lynann BolognaGupta, Rajesh, MD Sent: 06/29/2018   7:39 PM EST To: Chrystie NoseLinda R Hunt, RN, Dianah FieldSarah J Gribbin, PA-C Subject: FW: repeat ERCP                                Hi Bonita QuinLinda Can you put him on [facility]. Will be happy to take care of this Elby Showersaj  ----- Message ----- From: Jonah BlueGribbin, Sarah J, PA-C Sent: 06/24/2018   1:01 PM EST To: Kerin SalenArya Karki, MD, Charlie PitterHenry L Danis III, MD, # Subject: repeat ERCP                                    Hi messaging all to make sure the needed ERCP and ROVs are scheduled.   Had ERCP, stent placement with Dr Marca AnconaKarki on 11/1 (weekend ERCP coverage).   Her op note mentioned repeating study in 4 to 8 weeks to remove stent but did not specify with whom.   Not sure if fup will be with her or with Dr Myrtie Neitheranis. Note sent to avoid pt falling through the cracks as pt has been discharged this AM.    Thanks, Maralyn SagoSarah

## 2018-07-02 NOTE — Telephone Encounter (Signed)
I see  Dr. Myrtie Neitheranis -has already set him up for EGD with stent removal.  Then, we do not need to

## 2018-07-02 NOTE — Telephone Encounter (Signed)
Unable to leave message for patient as "failbox is currently full";

## 2018-07-03 NOTE — Telephone Encounter (Signed)
Called and spoke with patient- patient informed of MD recommendations and was informed of [facility] at 12:30pm; patient verbalized he had "already been told about this" and is still planning on having this procedure done; patient informed to call back if questions/concerns arise;

## 2018-07-04 ENCOUNTER — Other Ambulatory Visit: Payer: Self-pay | Admitting: Surgery

## 2018-07-04 ENCOUNTER — Telehealth: Payer: Self-pay | Admitting: Student

## 2018-07-04 ENCOUNTER — Other Ambulatory Visit: Payer: Self-pay | Admitting: Student

## 2018-07-04 DIAGNOSIS — R188 Other ascites: Secondary | ICD-10-CM

## 2018-07-04 DIAGNOSIS — K7689 Other specified diseases of liver: Secondary | ICD-10-CM

## 2018-07-04 NOTE — Telephone Encounter (Signed)
Received message from patient regarding clogged drain. Patient underwent an image-guided percutaneous perihepatic fluid collection drain placement 06/21/2018 by Dr. Grace IsaacWatts.  Patient states that he believes his drain is clogged. States that when he flushes his drain, not all of it "goes inside" and some stays in the tube. In addition, states that his drain is only yielding about 1 mL of fluid per day. Denies pain associated with drain at this time.  Discussed case with Dr. Fredia SorrowYamagata.  Informed patient that at this time, we believe that his abscess is subsiding. Plan to follow-up in drain clinic next week for CT and possible drain pull. Informed patient that someone from our clinic will call him to set up this appointment. Order placed and Gary BoerVicki from clinic made aware.  All questions answered and concerns addressed. Patient conveys understanding and agrees with plan.  Gary Bogalexandra M [name], PA-C 07/04/2018, 12:13 PM

## 2018-07-10 ENCOUNTER — Other Ambulatory Visit: Payer: BC Managed Care – PPO

## 2018-07-23 ENCOUNTER — Ambulatory Visit (HOSPITAL_COMMUNITY): Payer: BC Managed Care – PPO | Admitting: Anesthesiology

## 2018-07-23 ENCOUNTER — Encounter (HOSPITAL_COMMUNITY)
Admission: RE | Disposition: A | Discharge status: Home / Self Care | Payer: Self-pay | Source: Ambulatory Visit | Attending: Gastroenterology

## 2018-07-23 ENCOUNTER — Other Ambulatory Visit: Payer: Self-pay

## 2018-07-23 ENCOUNTER — Ambulatory Visit (HOSPITAL_COMMUNITY)
Admission: RE | Admit: 2018-07-23 | Discharge: 2018-07-23 | Disposition: A | Discharge status: Home / Self Care | Payer: BC Managed Care – PPO | Source: Ambulatory Visit | Attending: Gastroenterology | Admitting: Gastroenterology

## 2018-07-23 ENCOUNTER — Encounter (HOSPITAL_COMMUNITY): Payer: Self-pay | Admitting: *Deleted

## 2018-07-23 DIAGNOSIS — Z4659 Encounter for fitting and adjustment of other gastrointestinal appliance and device: Secondary | ICD-10-CM | POA: Diagnosis not present

## 2018-07-23 DIAGNOSIS — K859 Acute pancreatitis without necrosis or infection, unspecified: Secondary | ICD-10-CM

## 2018-07-23 DIAGNOSIS — Z9049 Acquired absence of other specified parts of digestive tract: Secondary | ICD-10-CM | POA: Diagnosis not present

## 2018-07-23 DIAGNOSIS — K219 Gastro-esophageal reflux disease without esophagitis: Secondary | ICD-10-CM | POA: Insufficient documentation

## 2018-07-23 DIAGNOSIS — I10 Essential (primary) hypertension: Secondary | ICD-10-CM | POA: Diagnosis not present

## 2018-07-23 DIAGNOSIS — E669 Obesity, unspecified: Secondary | ICD-10-CM | POA: Insufficient documentation

## 2018-07-23 DIAGNOSIS — Z6831 Body mass index (BMI) 31.0-31.9, adult: Secondary | ICD-10-CM | POA: Diagnosis not present

## 2018-07-23 HISTORY — PX: ESOPHAGOGASTRODUODENOSCOPY (EGD) WITH PROPOFOL: SHX5813

## 2018-07-23 HISTORY — PX: STENT REMOVAL: SHX6421

## 2018-07-23 SURGERY — ESOPHAGOGASTRODUODENOSCOPY (EGD) WITH PROPOFOL
Anesthesia: Monitor Anesthesia Care

## 2018-07-23 MED ORDER — PROPOFOL 10 MG/ML IV BOLUS
INTRAVENOUS | Status: AC
  Filled 2018-07-23: qty 40

## 2018-07-23 MED ORDER — PROPOFOL 500 MG/50ML IV EMUL
INTRAVENOUS | Status: DC | PRN
  Administered 2018-07-23 (×2): 20 mg via INTRAVENOUS
  Administered 2018-07-23: 10 mg via INTRAVENOUS
  Administered 2018-07-23: 50 mg via INTRAVENOUS

## 2018-07-23 MED ORDER — SODIUM CHLORIDE 0.9 % IV SOLN: INTRAVENOUS | Status: DC

## 2018-07-23 MED ORDER — PROPOFOL 500 MG/50ML IV EMUL
INTRAVENOUS | Status: DC | PRN
  Administered 2018-07-23: 125 ug/kg/min via INTRAVENOUS

## 2018-07-23 MED ORDER — LACTATED RINGERS IV SOLN
INTRAVENOUS | Status: DC
  Administered 2018-07-23: 11:00:00 via INTRAVENOUS

## 2018-07-23 SURGICAL SUPPLY — 14 items

## 2018-07-23 NOTE — Transfer of Care (Signed)
Immediate Anesthesia Transfer of Care Note  Patient: Gary Weaver  Procedure(s) Performed: ESOPHAGOGASTRODUODENOSCOPY (EGD) WITH PROPOFOL (N/A ) STENT REMOVAL  Patient Location: PACU  Anesthesia Type:MAC  Level of Consciousness: awake, alert  and oriented  Airway & Oxygen Therapy: Patient Spontanous Breathing and Patient connected to nasal cannula oxygen  Post-op Assessment: Report given to RN and Post -op Vital signs reviewed and stable  Post vital signs: Reviewed and stable  Last Vitals:  Vitals Value Taken Time  BP    Temp    Pulse 87 07/23/2018 11:20 AM  Resp 17 07/23/2018 11:20 AM  SpO2 97 % 07/23/2018 11:20 AM  Vitals shown include unvalidated device data.  Last Pain:  Vitals:   07/23/18 1015  TempSrc: Oral  PainSc: 0-No pain         Complications: No apparent anesthesia complications

## 2018-07-23 NOTE — Anesthesia Postprocedure Evaluation (Signed)
Anesthesia Post Note  Patient: Gary Weaver  Procedure(s) Performed: ESOPHAGOGASTRODUODENOSCOPY (EGD) WITH PROPOFOL (N/A ) STENT REMOVAL     Patient location during evaluation: Endoscopy Anesthesia Type: MAC Level of consciousness: awake and alert Pain management: pain level controlled Vital Signs Assessment: post-procedure vital signs reviewed and stable Respiratory status: spontaneous breathing, nonlabored ventilation and respiratory function stable Cardiovascular status: stable and blood pressure returned to baseline Postop Assessment: no apparent nausea or vomiting Anesthetic complications: no    Last Vitals:  Vitals:   07/23/18 1015 07/23/18 1120  BP: 108/83 116/77  Pulse: 90 86  Resp: 17 18  Temp: 36.8 C 36.6 C  SpO2: 95% 97%    Last Pain:  Vitals:   07/23/18 1120  TempSrc: Oral  PainSc: 0-No pain                 Lynda Rainwater

## 2018-07-23 NOTE — H&P (Signed)
  History:  This patient presents for endoscopic testing for recent suspected bile leak. EGD to remove biliary stent  Gary PaganiniMark Edward Votta Referring physician: Abigail MiyamotoPerry, Lawrence Edward, MD  Past Medical History: Past Medical History:  Diagnosis Date  . Anxiety   . Arrhythmia   . Arthritis   . Asthma   . GERD (gastroesophageal reflux disease)   . Gluten intolerance   . Headache   . Hearing loss   . Hyperlipidemia   . Hypertension   . Memory loss   . Pancreatitis   . Pneumonia    hx of   . Vision abnormalities      Past Surgical History: Past Surgical History:  Procedure Laterality Date  . BILIARY STENT PLACEMENT  06/23/2018   Procedure: BILIARY STENT PLACEMENT;  Surgeon: [name], [name], MD;  [facility];  Service: Gastroenterology;;  . COLONOSCOPY    . ERCP N/A 06/23/2018   Procedure: ENDOSCOPIC RETROGRADE CHOLANGIOPANCREATOGRAPHY (ERCP);  Surgeon: [name], [name], MD;  [facility];  Service: Gastroenterology;  Laterality: N/A;  . LAPAROSCOPIC CHOLECYSTECTOMY SINGLE SITE WITH INTRAOPERATIVE CHOLANGIOGRAM N/A 06/13/2018   Procedure: LAPAROSCOPIC CHOLECYSTECTOMY SINGLE SITE WITH INTRAOPERATIVE CHOLANGIOGRAM ERAS PATHWAY;  Surgeon: [name], [name], MD;  Location: [facility] ORS;  Service: General;  Laterality: N/A;  . SPHINCTEROTOMY  06/23/2018   Procedure: SPHINCTEROTOMY;  Surgeon: [name], [name], MD;  [facility];  Service: Gastroenterology;;  . UMBILICAL HERNIA REPAIR N/A 06/13/2018   Procedure: LAPAROSCOPIC POSSIBLE OPEN UMBILICAL HERNIA;  Surgeon: [name], [name], MD;  Location: [facility] ORS;  Service: General;  Laterality: N/A;  . UPPER GASTROINTESTINAL ENDOSCOPY    . WISDOM TOOTH EXTRACTION      Allergies: Allergies  Allergen Reactions  . Eggs Or Egg-Derived Products Diarrhea  . Gluten Meal Nausea And Vomiting  . Latex Rash    Occurs after long use of latex    Outpatient Meds: Current Facility-Administered Medications  Medication Dose Route Frequency  Provider Last Rate Last Dose  . lactated ringers infusion   Intravenous Continuous [name], [name], MD 20 mL/hr at 07/23/18 1031        ___________________________________________________________________ Objective   Exam:  BP 108/83   Pulse 90   Temp 98.2 F (36.8 C) (Oral)   Resp 17   Ht 5\' 6"  (1.676 m)   Wt 87.1 kg   SpO2 95%   BMI 30.99 kg/m    CV: RRR without murmur, S1/S2, no JVD, no peripheral edema  Resp: clear to auscultation bilaterally, normal RR and effort noted  GI: soft, no tenderness, with active bowel sounds. No guarding or palpable organomegaly noted.  Neuro: awake, alert and oriented x 3. Normal gross motor function and fluent speech   Assessment:  Complications of cholecystectomy, possible bile leak treated with ERCP and biliary stent placement.  Plan:  EGD with biliary stent removal.   Charlie PitterHenry L Danis Weaver

## 2018-07-23 NOTE — Interval H&P Note (Signed)
History and Physical Interval Note:  07/23/2018 10:54 AM  Gary Weaver  has presented today for surgery, with the diagnosis of CBD obstruction  The various methods of treatment have been discussed with the patient and family. After consideration of risks, benefits and other options for treatment, the patient has consented to  Procedure(s): ESOPHAGOGASTRODUODENOSCOPY (EGD) WITH PROPOFOL with stent removal (N/A) as a surgical intervention .  The patient's history has been reviewed, patient examined, no change in status, stable for surgery.  I have reviewed the patient's chart and labs.  Questions were answered to the patient's satisfaction.     Nelida Meuse III

## 2018-07-23 NOTE — Op Note (Signed)
[facility] Patient Name: Gary Weaver Procedure Date: 07/23/2018 MRN: [id number] Attending MD: Estill Cotta. Loletha Carrow , MD Date of Birth: 1966/05/09 CSN: [id number] Age: 52 Admit Type: Outpatient Procedure:                Upper GI endoscopy Indications:              Biliary stent removal Providers:                Mallie Mussel L. Loletha Carrow, MD, Carmie End, RN, Elspeth Cho Tech., Technician, Rosario Adie, CRNA Referring MD:             Michael Boston, MD Medicines:                Monitored Anesthesia Care Complications:            No immediate complications. Estimated Blood Loss:     Estimated blood loss: none. Procedure:                Pre-Anesthesia Assessment:                           - Prior to the procedure, a History and Physical                            was performed, and patient medications and                            allergies were reviewed. The patient's tolerance of                            previous anesthesia was also reviewed. The risks                            and benefits of the procedure and the sedation                            options and risks were discussed with the patient.                            All questions were answered, and informed consent                            was obtained. Prior Anticoagulants: The patient has                            taken no previous anticoagulant or antiplatelet                            agents. ASA Grade Assessment: II - A patient with                            mild systemic disease. After reviewing the risks  and benefits, the patient was deemed in                            satisfactory condition to undergo the procedure.                           After obtaining informed consent, the endoscope was                            passed under direct vision. Throughout the                            procedure, the patient's blood pressure, pulse, and                            oxygen saturations were monitored continuously. The                            [id number]-H190 ([id number]) Olympus adult endoscope was                            introduced through the mouth, and advanced to the                            second part of duodenum. The upper GI endoscopy was                            accomplished without difficulty. The patient                            tolerated the procedure well. Scope In: Scope Out: Findings:      The esophagus was normal.      The stomach was normal.      A previously placed plastic stent was seen in the second portion of the       duodenum. Intact stent removal was accomplished with a snare. Impression:               - Normal esophagus.                           - Normal stomach.                           - Plastic stent in the duodenum. Removed. Moderate Sedation:      MAC sedation used Recommendation:           - Patient has a contact number available for                            emergencies. The signs and symptoms of potential                            delayed complications were discussed with the                            patient.  Return to normal activities tomorrow.                            Written discharge instructions were provided to the                            patient.                           - Resume previous diet.                           - Continue present medications. Procedure Code(s):        --- Professional ---                           [id number], Esophagogastroduodenoscopy, flexible,                            transoral; with removal of foreign body(s) Diagnosis Code(s):        --- Professional ---                           Z46.59, Encounter for fitting and adjustment of                            other gastrointestinal appliance and device CPT copyright 2018 American Medical Association. All rights reserved. The codes documented in this report are preliminary and upon coder review may   be revised to meet current compliance requirements. Andric Kerce L. Loletha Carrow, MD 07/23/2018 11:19:20 AM This report has been signed electronically. Number of Addenda: 0

## 2018-07-23 NOTE — Discharge Instructions (Signed)
YOU HAD AN ENDOSCOPIC PROCEDURE TODAY: Refer to the procedure report and other information in the discharge instructions given to you for any specific questions about what was found during the examination. If this information does not answer your questions, please call [place] office at [phone] to clarify.   YOU SHOULD EXPECT: Some feelings of bloating in the abdomen. Passage of more gas than usual. Walking can help get rid of the air that was put into your GI tract during the procedure and reduce the bloating. If you had a lower endoscopy (such as a colonoscopy or flexible sigmoidoscopy) you may notice spotting of blood in your stool or on the toilet paper. Some abdominal soreness may be present for a day or two, also.  DIET: Your first meal following the procedure should be a light meal and then it is ok to progress to your normal diet. A half-sandwich or bowl of soup is an example of a good first meal. Heavy or fried foods are harder to digest and may make you feel nauseous or bloated. Drink plenty of fluids but you should avoid alcoholic beverages for 24 hours. If you had a esophageal dilation, please see attached instructions for diet.    ACTIVITY: Your care partner should take you home directly after the procedure. You should plan to take it easy, moving slowly for the rest of the day. You can resume normal activity the day after the procedure however YOU SHOULD NOT DRIVE, use power tools, machinery or perform tasks that involve climbing or major physical exertion for 24 hours (because of the sedation medicines used during the test).   SYMPTOMS TO REPORT IMMEDIATELY: A gastroenterologist can be reached at any hour. Please call [phone]  for any of the following symptoms:   Following upper endoscopy (EGD, EUS, ERCP, esophageal dilation) Vomiting of blood or coffee ground material  New, significant abdominal pain  New, significant chest pain or pain under the shoulder blades  Painful or  persistently difficult swallowing  New shortness of breath  Black, tarry-looking or red, bloody stools  FOLLOW UP:  If any biopsies were taken you will be contacted by phone or by letter within the next 1-3 weeks. Call [phone]  if you have not heard about the biopsies in 3 weeks.  Please also call with any specific questions about appointments or follow up tests.  

## 2018-07-23 NOTE — Anesthesia Preprocedure Evaluation (Addendum)
Anesthesia Evaluation  Patient identified by MRN, date of birth, ID band Patient awake    Reviewed: Allergy & Precautions, NPO status , Patient's Chart, lab work & pertinent test results  Airway Mallampati: II  TM Distance: >3 FB Neck ROM: Full    Dental  (+) Dental Advisory Given, Missing   Pulmonary asthma ,    Pulmonary exam normal breath sounds clear to auscultation       Cardiovascular hypertension, Pt. on medications Normal cardiovascular exam Rhythm:Regular Rate:Normal     Neuro/Psych  Headaches, PSYCHIATRIC DISORDERS Anxiety  Neuromuscular disease    GI/Hepatic GERD  Medicated,Bile leak Acute on chronic cholecystitis s/p lap cholecystectomy 06/13/2018   Endo/Other  Obesity   Renal/GU negative Renal ROS     Musculoskeletal  (+) Arthritis ,   Abdominal   Peds  Hematology  (+) Blood dyscrasia, anemia ,   Anesthesia Other Findings Day of surgery medications reviewed with the patient.  Reproductive/Obstetrics                             Anesthesia Physical  Anesthesia Plan  ASA: II and emergent  Anesthesia Plan: MAC   Post-op Pain Management:    Induction: Intravenous  PONV Risk Score and Plan: 1 and Treatment may vary due to age or medical condition  Airway Management Planned: Nasal Cannula  Additional Equipment:   Intra-op Plan:   Post-operative Plan:   Informed Consent: I have reviewed the patients History and Physical, chart, labs and discussed the procedure including the risks, benefits and alternatives for the proposed anesthesia with the patient or authorized representative who has indicated his/her understanding and acceptance.   Dental advisory given  Plan Discussed with: CRNA  Anesthesia Plan Comments:         Anesthesia Quick Evaluation

## 2018-07-23 NOTE — Anesthesia Procedure Notes (Signed)
Date/Time: 07/23/2018 10:58 AM Performed by: Thornell MuleStubblefield, Deacon Gadbois G, CRNA Oxygen Delivery Method: Nasal cannula

## 2018-07-25 ENCOUNTER — Encounter (HOSPITAL_COMMUNITY): Payer: Self-pay | Admitting: Gastroenterology

## 2019-05-12 IMAGING — RF DG CHOLANGIOGRAM OPERATIVE
1 series · 4 of 4 positions shown · non-contrast
Comparison: CT 06/08/2018

CLINICAL DATA: Cholecystectomy

EXAM:
INTRAOPERATIVE CHOLANGIOGRAM
TECHNIQUE: Cholangiographic images from the C-arm fluoroscopic device were
submitted for interpretation post-operatively. Please see the
procedural report for the amount of contrast and the fluoroscopy
time utilized.

[Series 1: run · 4 of 41 frames shown]
[frame 7/41]
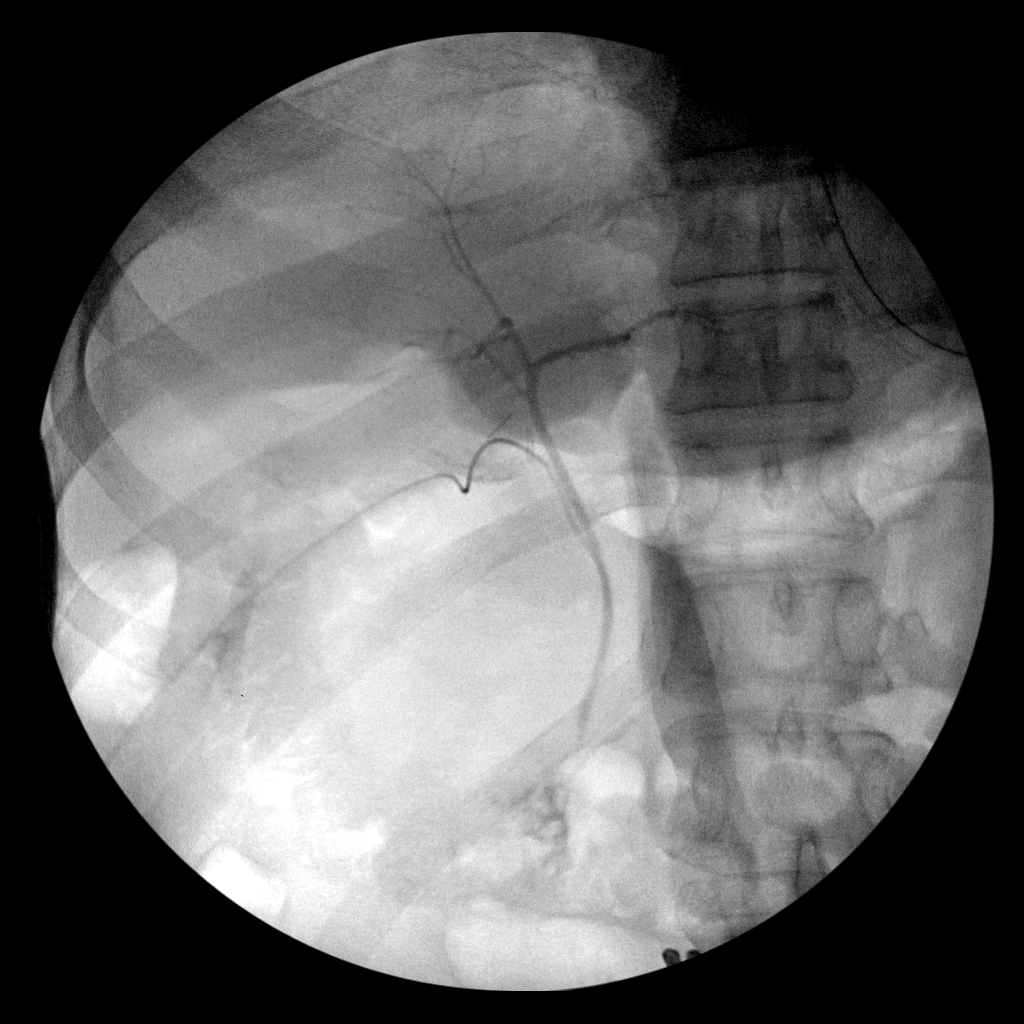
[frame 9/41]
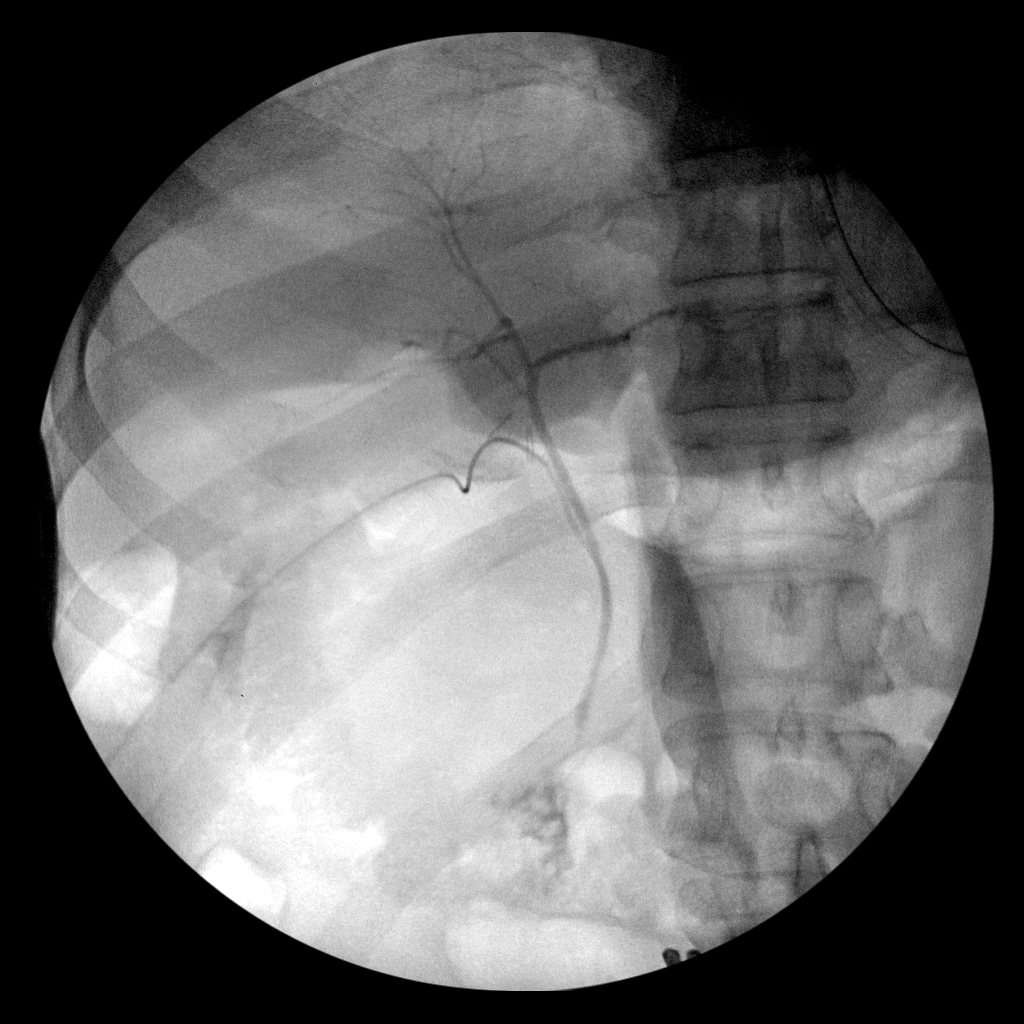
[frame 21/41]
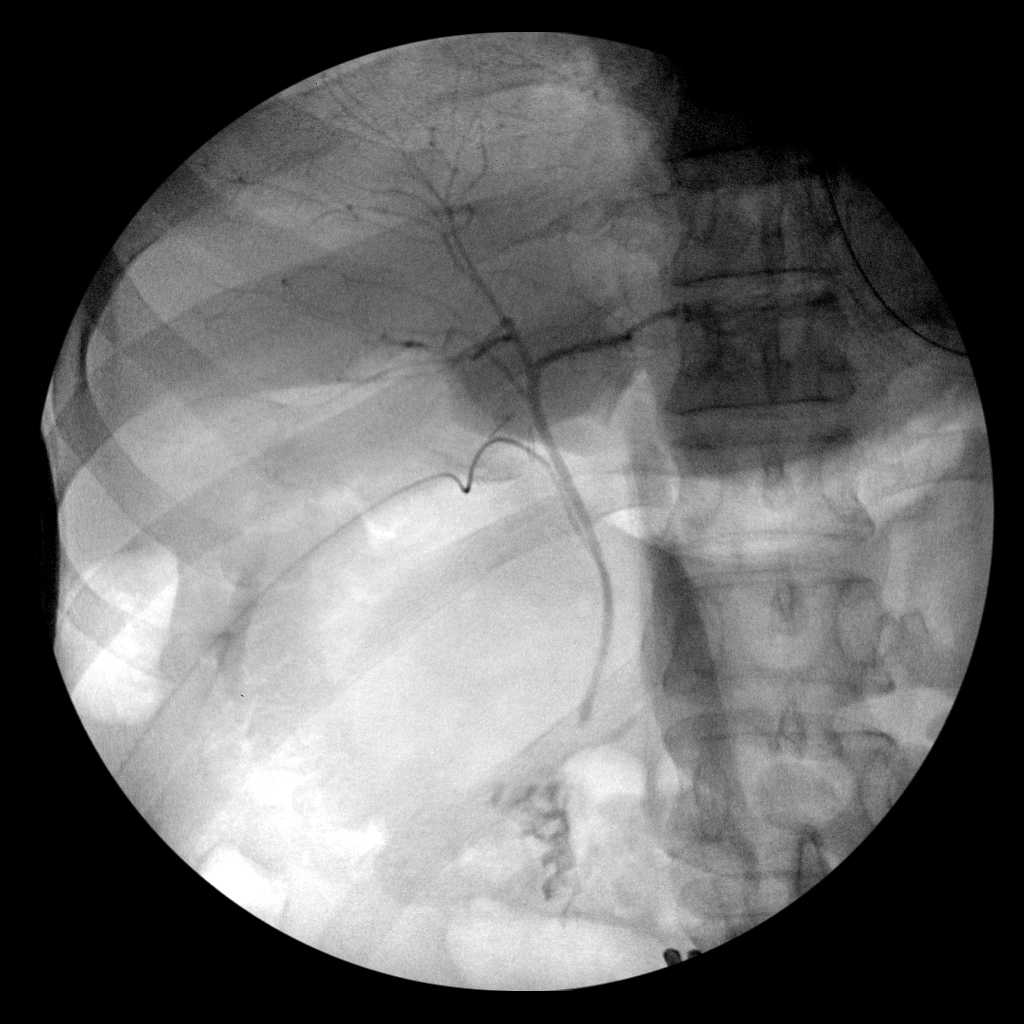
[frame 35/41]
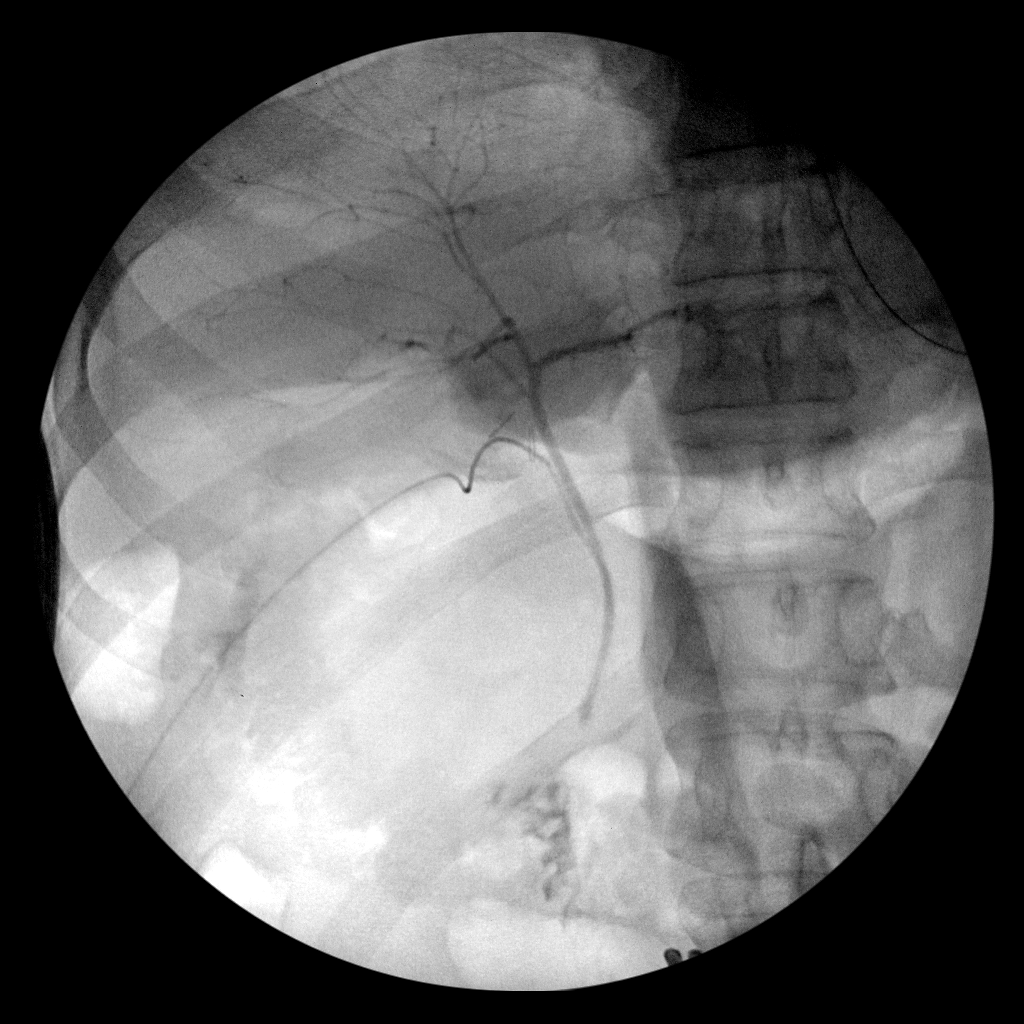

[4 of 4 positions shown; findings below may reference images not displayed]

FINDINGS: Diffusely narrow extrahepatic biliary tree without mucosal
irregularity or obstruction, presumably physiologic. No persistent
filling defects in the common duct. Intrahepatic ducts are
incompletely visualized, appearing decompressed centrally. Contrast
passes into the duodenum.

:
Negative for retained common duct stone.

## 2019-05-19 IMAGING — CT CT ABD-PELV W/ CM
2 of 5 series · 15 of 46 positions shown, 17 images · IV contrast (iopamidol)
Comparison: 06/08/2018

CLINICAL DATA: Right upper quadrant and mid abdominal pain. Recent
cholecystectomy 06/13/2018

EXAM:
CT ABDOMEN AND PELVIS WITH CONTRAST
TECHNIQUE: Multidetector CT imaging of the abdomen and pelvis was performed
using the standard protocol following bolus administration of
intravenous contrast.
CONTRAST:  100mL [id number] IOPAMIDOL ([id number]) INJECTION 61%

[Series 2: abd pelvis 5.00 br40 s3 ax · axial · 0.74mm/px · z∈[+1154,+1624]mm · 12 of 106 slices shown, 14 images]
[im 6/106  soft-tissue]
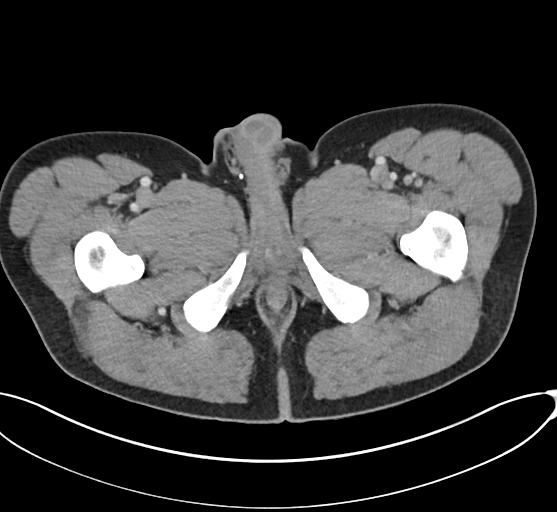
[im 6/106  bone]
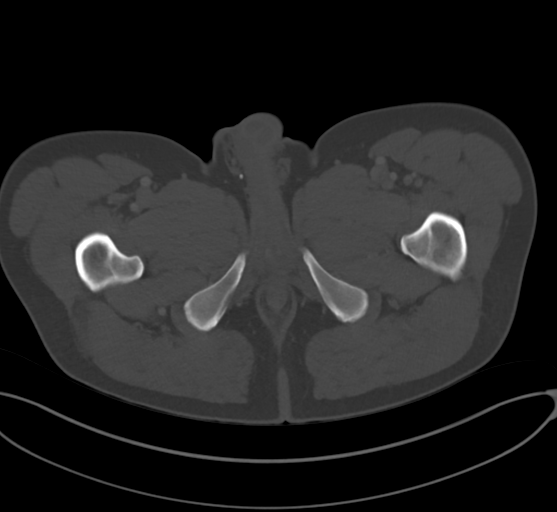
[im 18/106  soft-tissue]
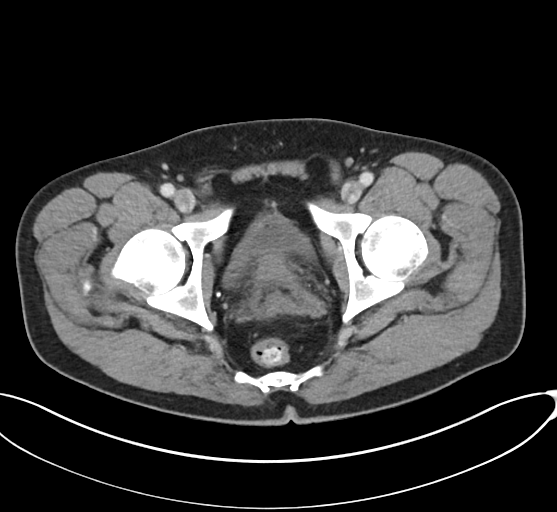
[im 24/106  soft-tissue]
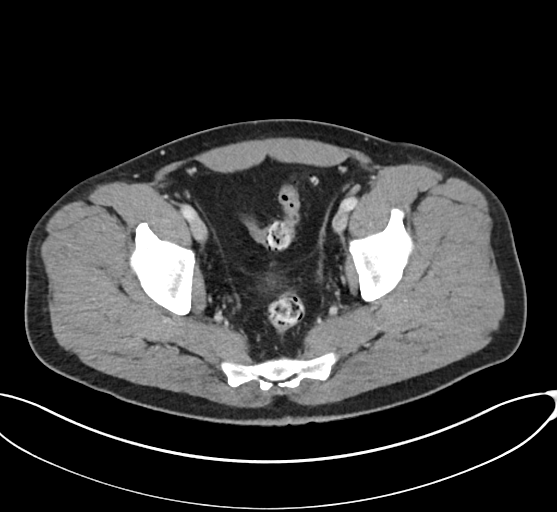
[im 30/106  soft-tissue]
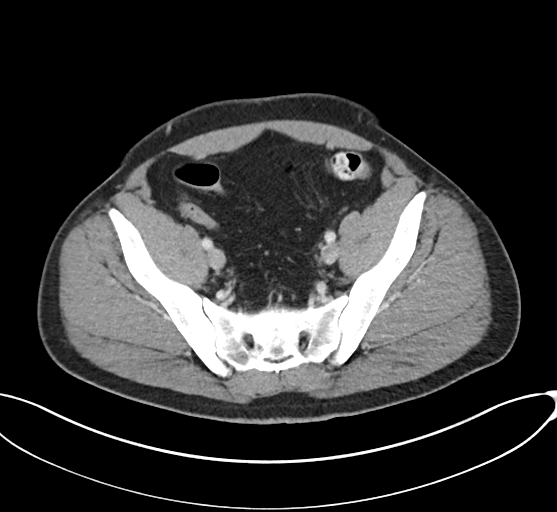
[im 41/106  soft-tissue]
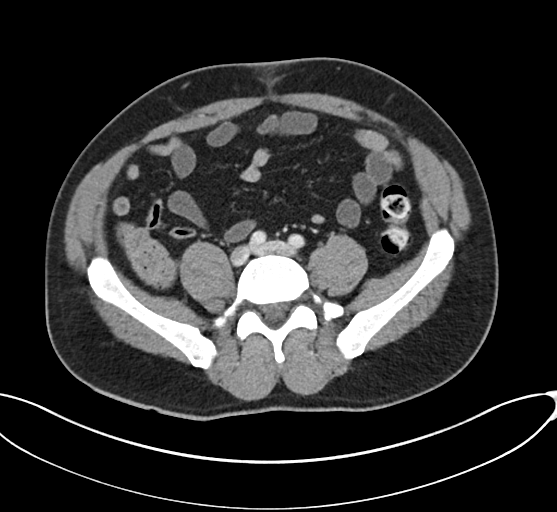
[im 47/106  soft-tissue]
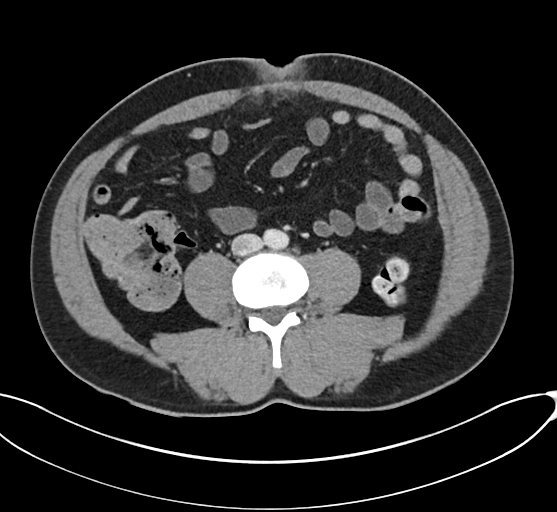
[im 59/106  soft-tissue]
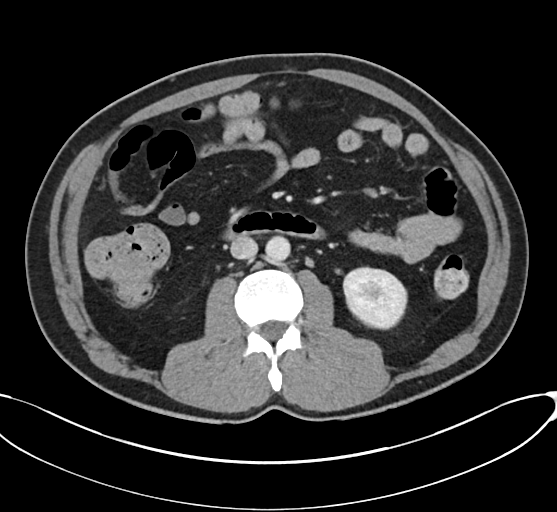
[im 65/106  soft-tissue]
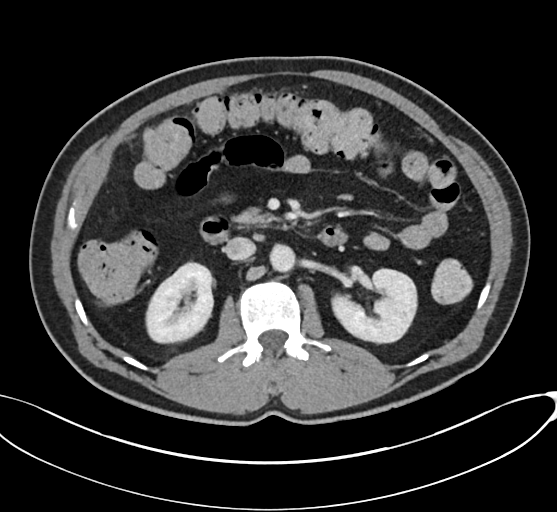
[im 76/106  soft-tissue]
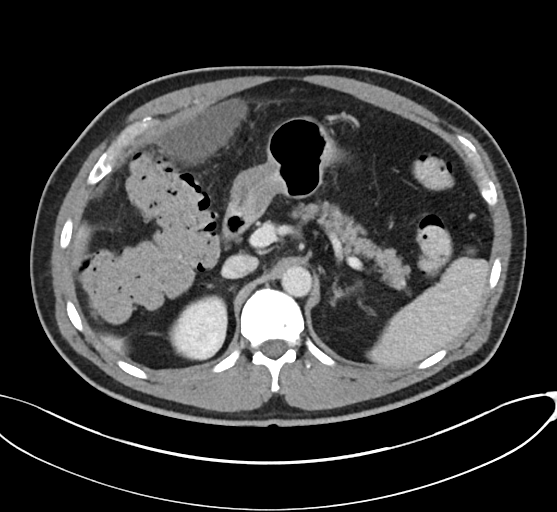
[im 76/106  bone]
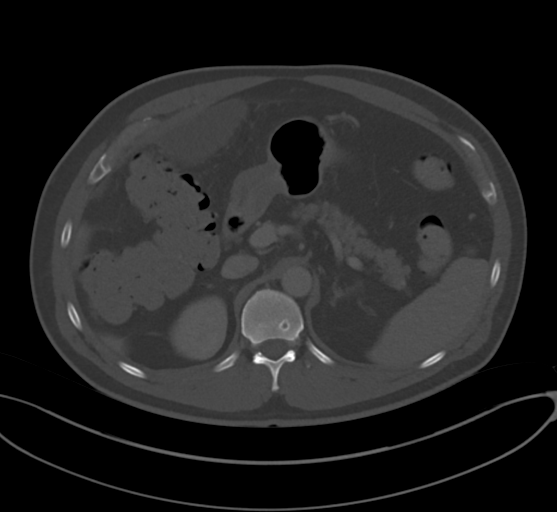
[im 82/106  soft-tissue]
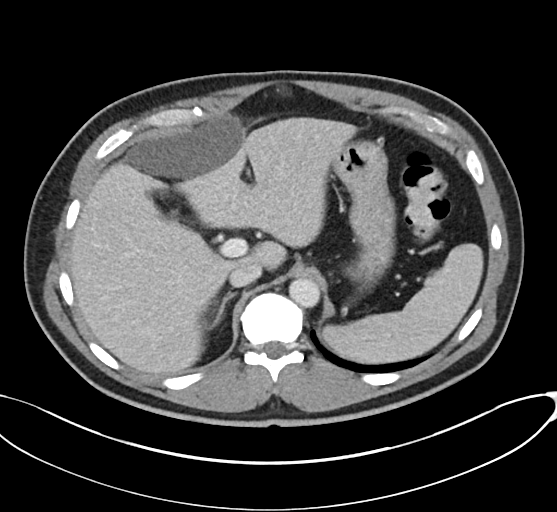
[im 88/106  soft-tissue]
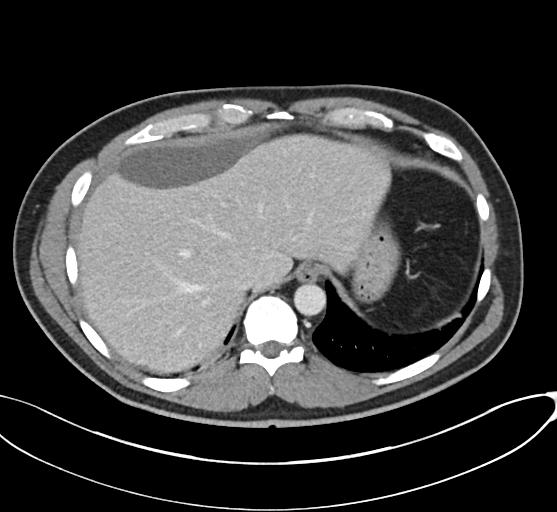
[im 100/106  soft-tissue]
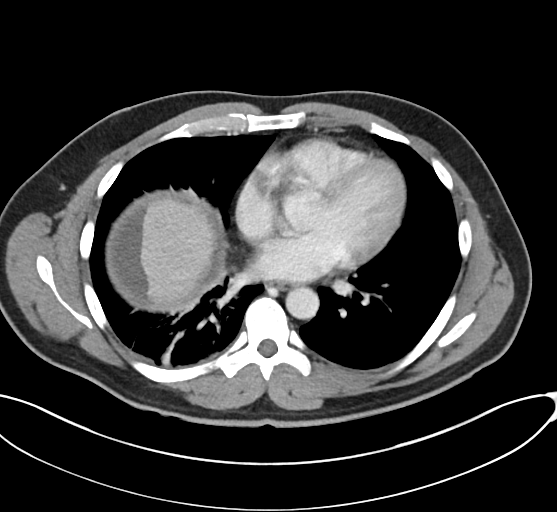

[Series 6: abd pelvis 2.00 br40 s3 cor · coronal · 0.81mm/px · 3 of 185 slices shown]
[im 62/185  soft-tissue]
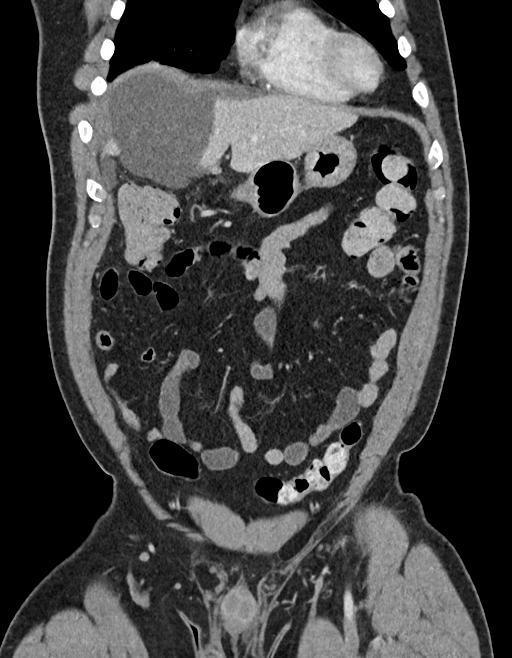
[im 82/185  soft-tissue]
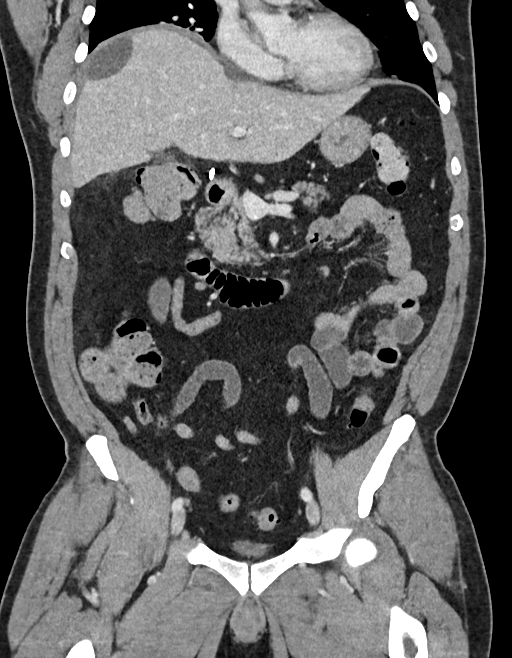
[im 103/185  soft-tissue]
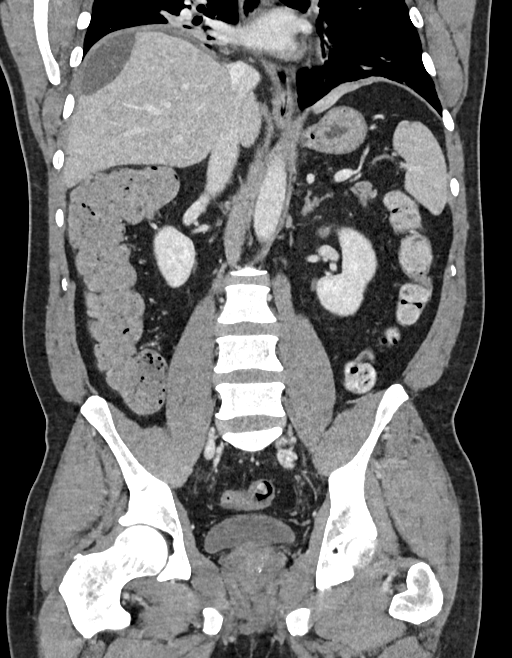

[15 of 46 positions shown; findings below may reference images not displayed]

FINDINGS: Lower chest: Trace right pleural effusion with atelectasis.

Hepatobiliary: No focal hepatic mass. Interval cholecystectomy.
Large perihepatic fluid collection along the dome of the liver
extending anteriorly along the medial segment of the left hepatic
lobe measuring approximately 3.6 x 8.6 x 9 cm. Secondarily infected
fluid collection cannot be excluded.

Pancreas: Unremarkable. No pancreatic ductal dilatation or
surrounding inflammatory changes.

Spleen: Normal in size without focal abnormality.

Adrenals/Urinary Tract: Adrenal glands are unremarkable. Kidneys are
normal, without renal calculi, focal lesion, or hydronephrosis.
Bladder is unremarkable.

Stomach/Bowel: Stomach is within normal limits. No evidence of bowel
wall thickening, distention, or inflammatory changes. Large amount
of stool throughout the colon.

Vascular/Lymphatic: No significant vascular findings are present. No
enlarged abdominal or pelvic lymph nodes.

Reproductive: Prostate is unremarkable.

Other: No abdominal wall hernia or abnormality. No abdominopelvic
ascites.

Musculoskeletal: No acute or significant osseous findings.
IMPRESSION: 1. Interval cholecystectomy. Large perihepatic fluid collection
along the dome of the liver extending anteriorly along the medial
segment of the left hepatic lobe concerning for a biloma.
Secondarily infected fluid collection cannot be excluded.

## 2019-05-28 ENCOUNTER — Encounter: Payer: Self-pay | Admitting: *Deleted

## 2019-05-28 ENCOUNTER — Other Ambulatory Visit: Payer: Self-pay

## 2019-05-28 ENCOUNTER — Ambulatory Visit (INDEPENDENT_AMBULATORY_CARE_PROVIDER_SITE_OTHER): Payer: BC Managed Care – PPO | Admitting: Cardiology

## 2019-05-28 ENCOUNTER — Encounter: Payer: Self-pay | Admitting: Cardiology

## 2019-05-28 VITALS — BP 118/90 | HR 81 | Ht 66.0 in | Wt 209.4 lb

## 2019-05-28 DIAGNOSIS — R079 Chest pain, unspecified: Secondary | ICD-10-CM | POA: Diagnosis not present

## 2019-05-28 DIAGNOSIS — R0602 Shortness of breath: Secondary | ICD-10-CM | POA: Insufficient documentation

## 2019-05-28 DIAGNOSIS — I10 Essential (primary) hypertension: Secondary | ICD-10-CM | POA: Diagnosis not present

## 2019-05-28 DIAGNOSIS — R002 Palpitations: Secondary | ICD-10-CM

## 2019-05-28 MED ORDER — VALSARTAN 80 MG PO TABS: 80.0000 mg | ORAL_TABLET | Freq: Every day | ORAL | 3 refills | Status: DC

## 2019-05-28 MED ORDER — CARVEDILOL 6.25 MG PO TABS: 6.2500 mg | ORAL_TABLET | Freq: Two times a day (BID) | ORAL | 3 refills | Status: DC

## 2019-05-28 NOTE — Patient Instructions (Addendum)
Medication Instructions:  Your physician has recommended you make the following change in your medication:   STOP metoprolol tartrate (lopressor)   DECREASE valsartan (diovan) 80 mg: Take 1 tablet daily  START carvedilol (coreg) 6.25 mg: Take 1 tablet twice daily  If you need a refill on your cardiac medications before your next appointment, please call your pharmacy.   Lab work: Your physician recommends that you return for lab work within 3-7 days before your cardiac CTA: BMP. Please return to our office for lab work, no appointment needed. No need to fast beforehand.   If you have labs (blood work) drawn today and your tests are completely normal, you will receive your results only by: Marland Kitchen MyChart Message (if you have MyChart) OR . A paper copy in the mail If you have any lab test that is abnormal or we need to change your treatment, we will call you to review the results.  Testing/Procedures: You had an EKG today.   Your physician has requested that you have an echocardiogram. Echocardiography is a painless test that uses sound waves to create images of your heart. It provides your doctor with information about the size and shape of your heart and how well your heart's chambers and valves are working. This procedure takes approximately one hour. There are no restrictions for this procedure.  Your physician has recommended that you wear a ZIO monitor. ZIO monitors are medical devices that record the heart's electrical activity. Doctors most often use these monitors to diagnose arrhythmias. Arrhythmias are problems with the speed or rhythm of the heartbeat. The monitor is a small, portable device. You can wear one while you do your normal daily activities. This is usually used to diagnose what is causing palpitations/syncope (passing out). Wear for 3 days.   Your physician has requested that you have cardiac CT. Cardiac computed tomography (CT) is a painless test that uses an x-ray machine  to take clear, detailed pictures of your heart. For further information please visit [email or web address]. Please follow instruction sheet as given.  [facility] [place], [place] [address] [phone]  If scheduled at [facility], please arrive at the [facility] main entrance of [facility] 30-45 minutes prior to test start time. Proceed to the [facility] Radiology Department (first floor) to check-in and test prep.   Please follow these instructions carefully (unless otherwise directed):  Hold all erectile dysfunction medications at least 3 days (72 hrs) prior to test.  On the Night Before the Test: . Be sure to Drink plenty of water. . Do not consume any caffeinated/decaffeinated beverages or chocolate 12 hours prior to your test. . Do not take any antihistamines 12 hours prior to your test.  On the Day of the Test: . Drink plenty of water. Do not drink any water within one hour of the test. . Do not eat any food 4 hours prior to the test. . You may take your regular medications prior to the test.  . Take your carvedilol (coreg) two hours prior to test.                 -If HR is less than 55 BPM- No Beta Blocker                -IF HR is greater than 55 BPM and patient is less than or equal to 18 yrs old Lopressor 100mg  x1.                -  If HR is greater than 55 BPM and patient is greater than 78 yrs old Lopressor 50 mg x1.         After the Test: . Drink plenty of water. . After receiving IV contrast, you may experience a mild flushed feeling. This is normal. . On occasion, you may experience a mild rash up to 24 hours after the test. This is not dangerous. If this occurs, you can take Benadryl 25 mg and increase your fluid intake. . If you experience trouble breathing, this can be serious. If it is severe call 911 IMMEDIATELY. If it is mild, please call our office. . If you take any of these medications: Glipizide/Metformin,  Avandament, Glucavance, please do not take 48 hours after completing test unless otherwise instructed.    Please contact the cardiac imaging nurse navigator should you have any questions/concerns Rockwell Alexandria, RN Navigator Cardiac Imaging [facility] [phone] Office   Follow-Up: At [facility], you and your health needs are our priority.  As part of our continuing mission to provide you with exceptional heart care, we have created designated Provider Care Teams.  These Care Teams include your primary Cardiologist (physician) and Advanced Practice Providers (APPs -  Physician Assistants and Nurse Practitioners) who all work together to provide you with the care you need, when you need it. You will need a follow up appointment in 1 months.      Echocardiogram An echocardiogram is a procedure that uses painless sound waves (ultrasound) to produce an image of the heart. Images from an echocardiogram can provide important information about:  Signs of coronary artery disease (CAD).  Aneurysm detection. An aneurysm is a weak or damaged part of an artery wall that bulges out from the normal force of blood pumping through the body.  Heart size and shape. Changes in the size or shape of the heart can be associated with certain conditions, including heart failure, aneurysm, and CAD.  Heart muscle function.  Heart valve function.  Signs of a past heart attack.  Fluid buildup around the heart.  Thickening of the heart muscle.  A tumor or infectious growth around the heart valves. Tell a health care provider about:  Any allergies you have.  All medicines you are taking, including vitamins, herbs, eye drops, creams, and over-the-counter medicines.  Any blood disorders you have.  Any surgeries you have had.  Any medical conditions you have.  Whether you are pregnant or may be pregnant. What are the risks? Generally, this is a safe procedure.  However, problems may occur, including:  Allergic reaction to dye (contrast) that may be used during the procedure. What happens before the procedure? No specific preparation is needed. You may eat and drink normally. What happens during the procedure?   An IV tube may be inserted into one of your veins.  You may receive contrast through this tube. A contrast is an injection that improves the quality of the pictures from your heart.  A gel will be applied to your chest.  A wand-like tool (transducer) will be moved over your chest. The gel will help to transmit the sound waves from the transducer.  The sound waves will harmlessly bounce off of your heart to allow the heart images to be captured in real-time motion. The images will be recorded on a computer. The procedure may vary among health care providers and hospitals. What happens after the procedure?  You may return to your normal, everyday life,  including diet, activities, and medicines, unless your health care provider tells you not to do that. Summary  An echocardiogram is a procedure that uses painless sound waves (ultrasound) to produce an image of the heart.  Images from an echocardiogram can provide important information about the size and shape of your heart, heart muscle function, heart valve function, and fluid buildup around your heart.  You do not need to do anything to prepare before this procedure. You may eat and drink normally.  After the echocardiogram is completed, you may return to your normal, everyday life, unless your health care provider tells you not to do that. This information is not intended to replace advice given to you by your health care provider. Make sure you discuss any questions you have with your health care provider. Document Released: 07/28/2000 Document Revised: 11/21/2018 Document Reviewed: 09/02/2016 Elsevier Patient Education  2020 Elsevier [employer]. Cardiac CT Angiogram  A cardiac CT  angiogram is a procedure to look at the heart and the area around the heart. It may be done to help find the cause of chest pains or other symptoms of heart disease. During this procedure, a large X-ray machine, called a CT scanner, takes detailed pictures of the heart and the surrounding area after a dye (contrast material) has been injected into blood vessels in the area. The procedure is also sometimes called a coronary CT angiogram, coronary artery scanning, or CTA. A cardiac CT angiogram allows the health care provider to see how well blood is flowing to and from the heart. The health care provider will be able to see if there are any problems, such as:  Blockage or narrowing of the coronary arteries in the heart.  Fluid around the heart.  Signs of weakness or disease in the muscles, valves, and tissues of the heart. Tell a health care provider about:  Any allergies you have. This is especially important if you have had a previous allergic reaction to contrast dye.  All medicines you are taking, including vitamins, herbs, eye drops, creams, and over-the-counter medicines.  Any blood disorders you have.  Any surgeries you have had.  Any medical conditions you have.  Whether you are pregnant or may be pregnant.  Any anxiety disorders, chronic pain, or other conditions you have that may increase your stress or prevent you from lying still. What are the risks? Generally, this is a safe procedure. However, problems may occur, including:  Bleeding.  Infection.  Allergic reactions to medicines or dyes.  Damage to other structures or organs.  Kidney damage from the dye or contrast that is used.  Increased risk of cancer from radiation exposure. This risk is low. Talk with your health care provider about: ? The risks and benefits of testing. ? How you can receive the lowest dose of radiation. What happens before the procedure?  Wear comfortable clothing and remove any jewelry,  glasses, dentures, and hearing aids.  Follow instructions from your health care provider about eating and drinking. This may include: ? For 12 hours before the test - avoid caffeine. This includes tea, coffee, soda, energy drinks, and diet pills. Drink plenty of water or other fluids that do not have caffeine in them. Being well-hydrated can prevent complications. ? For 4-6 hours before the test - stop eating and drinking. The contrast dye can cause nausea, but this is less likely if your stomach is empty.  Ask your health care provider about changing or stopping your regular medicines. This is especially  important if you are taking diabetes medicines, blood thinners, or medicines to treat erectile dysfunction. What happens during the procedure?  Hair on your chest may need to be removed so that small sticky patches called electrodes can be placed on your chest. These will transmit information that helps to monitor your heart during the test.  An IV tube will be inserted into one of your veins.  You might be given a medicine to control your heart rate during the test. This will help to ensure that good images are obtained.  You will be asked to lie on an exam table. This table will slide in and out of the CT machine during the procedure.  Contrast dye will be injected into the IV tube. You might feel warm, or you may get a metallic taste in your mouth.  You will be given a medicine (nitroglycerin) to relax (dilate) the arteries in your heart.  The table that you are lying on will move into the CT machine tunnel for the scan.  The person running the machine will give you instructions while the scans are being done. You may be asked to: ? Keep your arms above your head. ? Hold your breath. ? Stay very still, even if the table is moving.  When the scanning is complete, you will be moved out of the machine.  The IV tube will be removed. The procedure may vary among health care providers  and hospitals. What happens after the procedure?  You might feel warm, or you may get a metallic taste in your mouth from the contrast dye.  You may have a headache from the nitroglycerin.  After the procedure, drink water or other fluids to wash (flush) the contrast material out of your body.  Contact a health care provider if you have any symptoms of allergy to the contrast. These symptoms include: ? Shortness of breath. ? Rash or hives. ? A racing heartbeat.  Most people can return to their normal activities right after the procedure. Ask your health care provider what activities are safe for you.  It is up to you to get the results of your procedure. Ask your health care provider, or the department that is doing the procedure, when your results will be ready. Summary  A cardiac CT angiogram is a procedure to look at the heart and the area around the heart. It may be done to help find the cause of chest pains or other symptoms of heart disease.  During this procedure, a large X-ray machine, called a CT scanner, takes detailed pictures of the heart and the surrounding area after a dye (contrast material) has been injected into blood vessels in the area.  Ask your health care provider about changing or stopping your regular medicines before the procedure. This is especially important if you are taking diabetes medicines, blood thinners, or medicines to treat erectile dysfunction.  After the procedure, drink water or other fluids to wash (flush) the contrast material out of your body. This information is not intended to replace advice given to you by your health care provider. Make sure you discuss any questions you have with your health care provider. Document Released: 07/13/2008 Document Revised: 07/13/2017 Document Reviewed: 06/19/2016 Elsevier Patient Education  2020 Elsevier Inc.   Carvedilol tablets What is this medicine? CARVEDILOL (KAR ve dil ol) is a beta-blocker.  Beta-blockers reduce the workload on the heart and help it to beat more regularly. This medicine is used to treat high blood  pressure and heart failure. This medicine may be used for other purposes; ask your health care provider or pharmacist if you have questions. COMMON BRAND NAME(S): Coreg What should I tell my health care provider before I take this medicine? They need to know if you have any of these conditions:  circulation problems  diabetes  history of heart attack or heart disease  liver disease  lung or breathing disease, like asthma or emphysema  pheochromocytoma  slow or irregular heartbeat  thyroid disease  an unusual or allergic reaction to carvedilol, other beta-blockers, medicines, foods, dyes, or preservatives  pregnant or trying to get pregnant  breast-feeding How should I use this medicine? Take this medicine by mouth with a glass of water. Follow the directions on the prescription label. It is best to take the tablets with food. Take your doses at regular intervals. Do not take your medicine more often than directed. Do not stop taking except on the advice of your doctor or health care professional. Talk to your pediatrician regarding the use of this medicine in children. Special care may be needed. Overdosage: If you think you have taken too much of this medicine contact a poison control center or emergency room at once. NOTE: This medicine is only for you. Do not share this medicine with others. What if I miss a dose? If you miss a dose, take it as soon as you can. If it is almost time for your next dose, take only that dose. Do not take double or extra doses. What may interact with this medicine? This medicine may interact with the following medications:  certain medicines for blood pressure, heart disease, irregular heart beat  certain medicines for depression, like fluoxetine or paroxetine  certain medicines for diabetes, like glipizide or glyburide   cimetidine  clonidine  cyclosporine  digoxin  MAOIs like Carbex, Eldepryl, Marplan, Nardil, and Parnate  reserpine  rifampin This list may not describe all possible interactions. Give your health care provider a list of all the medicines, herbs, non-prescription drugs, or dietary supplements you use. Also tell them if you smoke, drink alcohol, or use illegal drugs. Some items may interact with your medicine. What should I watch for while using this medicine? Check your heart rate and blood pressure regularly while you are taking this medicine. Ask your doctor or health care professional what your heart rate and blood pressure should be, and when you should contact him or her. Do not stop taking this medicine suddenly. This could lead to serious heart-related effects. Contact your doctor or health care professional if you have difficulty breathing while taking this drug. Check your weight daily. Ask your doctor or health care professional when you should notify him/her of any weight gain. You may get drowsy or dizzy. Do not drive, use machinery, or do anything that requires mental alertness until you know how this medicine affects you. To reduce the risk of dizzy or fainting spells, do not sit or stand up quickly. Alcohol can make you more drowsy, and increase flushing and rapid heartbeats. Avoid alcoholic drinks. This medicine may increase blood sugar. Ask your healthcare provider if changes in diet or medicines are needed if you have diabetes. If you are going to have surgery, tell your doctor or health care professional that you are taking this medicine. What side effects may I notice from receiving this medicine? Side effects that you should report to your doctor or health care professional as soon as possible:  allergic reactions  like skin rash, itching or hives, swelling of the face, lips, or tongue  breathing problems  dark urine  irregular heartbeat   signs and symptoms of  high blood sugar such as being more thirsty or hungry or having to urinate more than normal. You may also feel very tired or have blurry vision.  swollen legs or ankles  vomiting  yellowing of the eyes or skin Side effects that usually do not require medical attention (report to your doctor or health care professional if they continue or are bothersome):  change in sex drive or performance  diarrhea  dry eyes (especially if wearing contact lenses)  dry, itching skin  headache  nausea  unusually tired This list may not describe all possible side effects. Call your doctor for medical advice about side effects. You may report side effects to FDA at 1-800-FDA-1088. Where should I keep my medicine? Keep out of the reach of children. Store at room temperature below 30 degrees C (86 degrees F). Protect from moisture. Keep container tightly closed. Throw away any unused medicine after the expiration date. NOTE: This sheet is a summary. It may not cover all possible information. If you have questions about this medicine, talk to your doctor, pharmacist, or health care provider.  2020 Elsevier/Gold Standard (2018-05-22 09:13:57)

## 2019-05-28 NOTE — Progress Notes (Signed)
Cardiology Office Note:    Date:  05/28/2019   ID:  Gary Weaver, DOB [birth year], MRN [id number]  PCP:  Abigail Miyamoto, MD  Cardiologist:  No primary care provider on file.  Electrophysiologist:  None   Referring MD: Abigail Miyamoto,*   The patient is here to be evaluated for chest pain.   History of Present Illness:    Gary Weaver is a 53 y.o. male with a hx of  Hypertension,  Obesity presents to be evaluate for chest pain. He reports that he has been experiencing left-sided chest pain.  He states that is intermittent and sometimes over up to his shoulder.  This is associated with shortness of breath.  He tells me that the pain is intermittent once comes on it lasts about a few minutes.  In addition, he reports that he has been experiencing intermittent complications.  He describes as burning onset of increased heart rate.  Which at times associated with nausea and lightheadedness.  He notes that it can last from a few minutes to up to 30 minutes at times.  He also then tells me that his blood pressure has also been out of range for most recently he has been running around 150s to 160s systolic where in the past he was around 110-120s.  He has been taking an extra dose of his losartan.  He was advised to take valsartan 160 mg in the past which she could not have given hypotension but now due to his elevated blood pressure he has been taking half a pill in the morning and half a pill in the afternoon.  Other complaints today. Past Medical History:  Diagnosis Date  . Anxiety   . Arrhythmia   . Arthritis   . Asthma   . GERD (gastroesophageal reflux disease)   . Gluten intolerance   . Headache   . Hearing loss   . Hyperlipidemia   . Hypertension   . Memory loss   . Pancreatitis   . Pneumonia    hx of   . Vision abnormalities     Past Surgical History:  Procedure Laterality Date  . BILIARY STENT PLACEMENT  06/23/2018   Procedure: BILIARY STENT  PLACEMENT;  Surgeon: Kerin Salen, MD;  Location: [facility] ENDOSCOPY;  Service: Gastroenterology;;  . COLONOSCOPY    . ERCP N/A 06/23/2018   Procedure: ENDOSCOPIC RETROGRADE CHOLANGIOPANCREATOGRAPHY (ERCP);  Surgeon: Kerin Salen, MD;  Location: [facility] ENDOSCOPY;  Service: Gastroenterology;  Laterality: N/A;  . ESOPHAGOGASTRODUODENOSCOPY (EGD) WITH PROPOFOL N/A 07/23/2018   Procedure: ESOPHAGOGASTRODUODENOSCOPY (EGD) WITH PROPOFOL;  Surgeon: Sherrilyn Rist, MD;  Location: [facility] ENDOSCOPY;  Service: Gastroenterology;  Laterality: N/A;  . LAPAROSCOPIC CHOLECYSTECTOMY SINGLE SITE WITH INTRAOPERATIVE CHOLANGIOGRAM N/A 06/13/2018   Procedure: LAPAROSCOPIC CHOLECYSTECTOMY SINGLE SITE WITH INTRAOPERATIVE CHOLANGIOGRAM ERAS PATHWAY;  Surgeon: Karie Soda, MD;  Location: [facility] ORS;  Service: General;  Laterality: N/A;  . SPHINCTEROTOMY  06/23/2018   Procedure: SPHINCTEROTOMY;  Surgeon: Kerin Salen, MD;  Location: [facility] ENDOSCOPY;  Service: Gastroenterology;;  . Francine Graven REMOVAL  07/23/2018   Procedure: STENT REMOVAL;  Surgeon: Sherrilyn Rist, MD;  Location: [facility] ENDOSCOPY;  Service: Gastroenterology;;  . UMBILICAL HERNIA REPAIR N/A 06/13/2018   Procedure: LAPAROSCOPIC POSSIBLE OPEN UMBILICAL HERNIA;  Surgeon: Karie Soda, MD;  Location: [facility] ORS;  Service: General;  Laterality: N/A;  . UPPER GASTROINTESTINAL ENDOSCOPY    . WISDOM TOOTH EXTRACTION      Current Medications: Current Meds  Medication Sig  . albuterol (PROVENTIL  HFA;VENTOLIN HFA) 108 (90 Base) MCG/ACT inhaler Inhale 2 puffs into the lungs every 6 (six) hours as needed for wheezing or shortness of breath.  . Cholecalciferol (VITAMIN D3) 50 MCG (2000 UT) TABS Take 1 tablet by mouth daily.  . cyclobenzaprine (FLEXERIL) 5 MG tablet Take 1 tablet (5 mg total) by mouth 3 (three) times daily as needed for muscle spasms.  . fexofenadine (ALLEGRA) 180 MG tablet Take 180 mg by mouth daily.   Marland Kitchen gabapentin (NEURONTIN) 300 MG capsule Take 600 mg by mouth 2 (two)  times daily.   . hydrOXYzine (VISTARIL) 25 MG capsule Take 25 mg by mouth daily.   . metoCLOPramide (REGLAN) 5 MG tablet Take 5 mg by mouth at bedtime.   Marland Kitchen omeprazole (PRILOSEC) 40 MG capsule Take 40 mg by mouth daily.  . valsartan (DIOVAN) 80 MG tablet Take 1 tablet (80 mg total) by mouth daily.  . [DISCONTINUED] metoprolol tartrate (LOPRESSOR) 25 MG tablet TAKE 1 TABLET BY MOUTH BID  . [DISCONTINUED] valsartan (DIOVAN) 160 MG tablet Take 160 mg by mouth daily.      Allergies:   Eggs or egg-derived products, Gluten meal, and Latex   Social History   Socioeconomic History  . Marital status: Married    Spouse name: Not on file  . Number of children: 3  . Years of education: Not on file  . Highest education level: Not on file  Occupational History  . Not on file  Social Needs  . Financial resource strain: Not on file  . Food insecurity    Worry: Not on file    Inability: Not on file  . Transportation needs    Medical: Not on file    Non-medical: Not on file  Tobacco Use  . Smoking status: Never Smoker  . Smokeless tobacco: Former Neurosurgeon    Types: Chew  Substance and Sexual Activity  . Alcohol use: Not Currently  . Drug use: No  . Sexual activity: Not on file  Lifestyle  . Physical activity    Days per week: Not on file    Minutes per session: Not on file  . Stress: Not on file  Relationships  . Social Musician on phone: Not on file    Gets together: Not on file    Attends religious service: Not on file    Active member of club or organization: Not on file    Attends meetings of clubs or organizations: Not on file    Relationship status: Not on file  Other Topics Concern  . Not on file  Social History Narrative  . Not on file     Family History: The patient's family history includes Bladder Cancer in his mother; Colon cancer in his maternal grandmother; Colon polyps in his father; Hypertension in his father; Prostate cancer in his father. There is no  history of Esophageal cancer, Rectal cancer, Stomach cancer, or Pancreatic cancer.  ROS:   Review of Systems  Constitution: Negative for decreased appetite, fever and weight gain.  HENT: Negative for congestion, ear discharge, hoarse voice and sore throat.   Eyes: Negative for discharge, redness, vision loss in right eye and visual halos.  Cardiovascular: Negative for chest pain, dyspnea on exertion, leg swelling, orthopnea and palpitations.  Respiratory: Negative for cough, hemoptysis, shortness of breath and snoring.   Endocrine: Negative for heat intolerance and polyphagia.  Hematologic/Lymphatic: Negative for bleeding problem. Does not bruise/bleed easily.  Skin: Negative for flushing, nail changes, rash  and suspicious lesions.  Musculoskeletal: Negative for arthritis, joint pain, muscle cramps, myalgias, neck pain and stiffness.  Gastrointestinal: Negative for abdominal pain, bowel incontinence, diarrhea and excessive appetite.  Genitourinary: Negative for decreased libido, genital sores and incomplete emptying.  Neurological: Negative for brief paralysis, focal weakness, headaches and loss of balance.  Psychiatric/Behavioral: Negative for altered mental status, depression and suicidal ideas.  Allergic/Immunologic: Negative for HIV exposure and persistent infections.    EKGs/Labs/Other Studies Reviewed:    The following studies were reviewed today:   EKG:  The ekg ordered today demonstrates sinus rhythm, heart rate 84 bpm similar compared to EKG performed in September 2020.  Recent Labs: 06/24/2018: ALT 32; BUN 11; Creatinine, Ser 1.22; Hemoglobin 11.7; Platelets 508; Potassium 4.3; Sodium 138  Recent Lipid Panel    Component Value Date/Time   TRIG 101.0 05/10/2017 0911    Physical Exam:    VS:  BP 118/90 (BP Location: Right Arm, Patient Position: Sitting, Cuff Size: Large)   Pulse 81   Ht 5\' 6"  (1.676 m)   Wt 209 lb 6.4 oz (95 kg)   SpO2 94%   BMI 33.80 kg/m     Wt  Readings from Last 3 Encounters:  05/28/19 209 lb 6.4 oz (95 kg)  07/23/18 192 lb 0.3 oz (87.1 kg)  06/23/18 192 lb 0.3 oz (87.1 kg)     GEN: Well nourished, well developed in no acute distress HEENT: Normal NECK: No JVD; No carotid bruits LYMPHATICS: No lymphadenopathy CARDIAC: S1S2 noted,RRR, no murmurs, rubs, gallops RESPIRATORY:  Clear to auscultation without rales, wheezing or rhonchi  ABDOMEN: Soft, non-tender, non-distended, +bowel sounds, no guarding. EXTREMITIES: No edema, No cyanosis, no clubbing MUSCULOSKELETAL:  No edema; No deformity  SKIN: Warm and dry NEUROLOGIC:  Alert and oriented x 3, non-focal PSYCHIATRIC:  Normal affect, good insight  ASSESSMENT:    1. Palpitations   2. Chest pain, unspecified type   3. Hypertension, unspecified type    PLAN:    1. I am going to make some changes with his antihypertensive.  I will stop the Lopressor today, and start him on carvedilol 6.25 mg twice daily.  He takes half a pill of valsartan 160 therefore I am going to reorder his valsartan as valsartan 80 mg daily.   2.  In terms of her chest pain a CTA has been ordered to assess for any coronary disease.  3.  I would like to rule out a cardiovascular etiology of this palpitation, therefore at this time I would like to placed a zio patch for   3  days. In additon a transthoracic echocardiogram will be ordered to assess LV/RV function and any structural abnormalities. Once these testing have been performed amd reviewed further reccomendations will be made. For now, I do reccomend that the patient goes to the nearest ED if  symptoms recur.   The patient is in agreement with the above plan. The patient left the office in stable condition.  The patient will follow up in 1 month.  Medication Adjustments/Labs and Tests Ordered: Current medicines are reviewed at length with the patient today.  Concerns regarding medicines are outlined above.  Orders Placed This Encounter  Procedures   . CT CORONARY FRACTIONAL FLOW RESERVE DATA PREP  . CT CORONARY FRACTIONAL FLOW RESERVE FLUID ANALYSIS  . CT CORONARY MORPH W/CTA COR W/SCORE W/CA W/CM &/OR WO/CM  . Basic Metabolic Panel (BMET)  . LONG TERM MONITOR (3-14 DAYS)  . EKG 12-Lead  . ECHOCARDIOGRAM COMPLETE  Meds ordered this encounter  Medications  . valsartan (DIOVAN) 80 MG tablet    Sig: Take 1 tablet (80 mg total) by mouth daily.    Dispense:  30 tablet    Refill:  3  . carvedilol (COREG) 6.25 MG tablet    Sig: Take 1 tablet (6.25 mg total) by mouth 2 (two) times daily.    Dispense:  60 tablet    Refill:  3    Patient Instructions  Medication Instructions:  Your physician has recommended you make the following change in your medication:   STOP metoprolol tartrate (lopressor)   DECREASE valsartan (diovan) 80 mg: Take 1 tablet daily  START carvedilol (coreg) 6.25 mg: Take 1 tablet twice daily  If you need a refill on your cardiac medications before your next appointment, please call your pharmacy.   Lab work: Your physician recommends that you return for lab work within 3-7 days before your cardiac CTA: BMP. Please return to our office for lab work, no appointment needed. No need to fast beforehand.   If you have labs (blood work) drawn today and your tests are completely normal, you will receive your results only by: Marland Kitchen MyChart Message (if you have MyChart) OR . A paper copy in the mail If you have any lab test that is abnormal or we need to change your treatment, we will call you to review the results.  Testing/Procedures: You had an EKG today.   Your physician has requested that you have an echocardiogram. Echocardiography is a painless test that uses sound waves to create images of your heart. It provides your doctor with information about the size and shape of your heart and how well your heart's chambers and valves are working. This procedure takes approximately one hour. There are no restrictions for  this procedure.  Your physician has recommended that you wear a ZIO monitor. ZIO monitors are medical devices that record the heart's electrical activity. Doctors most often use these monitors to diagnose arrhythmias. Arrhythmias are problems with the speed or rhythm of the heartbeat. The monitor is a small, portable device. You can wear one while you do your normal daily activities. This is usually used to diagnose what is causing palpitations/syncope (passing out). Wear for 3 days.   Your physician has requested that you have cardiac CT. Cardiac computed tomography (CT) is a painless test that uses an x-ray machine to take clear, detailed pictures of your heart. For further information please visit [email or web address] Please follow instruction sheet as given.  [facility] [address] [place], [place] [address] [phone]  If scheduled at [facility], please arrive at the [facility] main entrance of [facility] 30-45 minutes prior to test start time. Proceed to the [facility] Radiology Department (first floor) to check-in and test prep.   Please follow these instructions carefully (unless otherwise directed):  Hold all erectile dysfunction medications at least 3 days (72 hrs) prior to test.  On the Night Before the Test: . Be sure to Drink plenty of water. . Do not consume any caffeinated/decaffeinated beverages or chocolate 12 hours prior to your test. . Do not take any antihistamines 12 hours prior to your test.  On the Day of the Test: . Drink plenty of water. Do not drink any water within one hour of the test. . Do not eat any food 4 hours prior to the test. . You may take your regular medications prior to the test.  .  Take your carvedilol (coreg) two hours prior to test.                 -If HR is less than 55 BPM- No Beta Blocker                -IF HR is greater than 55 BPM and patient is less than or equal to 33 yrs old Lopressor 100mg  x1.                 -If HR is greater than 55 BPM and patient is greater than 32 yrs old Lopressor 50 mg x1.         After the Test: . Drink plenty of water. . After receiving IV contrast, you may experience a mild flushed feeling. This is normal. . On occasion, you may experience a mild rash up to 24 hours after the test. This is not dangerous. If this occurs, you can take Benadryl 25 mg and increase your fluid intake. . If you experience trouble breathing, this can be serious. If it is severe call 911 IMMEDIATELY. If it is mild, please call our office. . If you take any of these medications: Glipizide/Metformin, Avandament, Glucavance, please do not take 48 hours after completing test unless otherwise instructed.    Please contact the cardiac imaging nurse navigator should you have any questions/concerns Rockwell Alexandria, RN Navigator Cardiac Imaging [facility] [phone] Office   Follow-Up: At [facility], you and your health needs are our priority.  As part of our continuing mission to provide you with exceptional heart care, we have created designated Provider Care Teams.  These Care Teams include your primary Cardiologist (physician) and Advanced Practice Providers (APPs -  Physician Assistants and Nurse Practitioners) who all work together to provide you with the care you need, when you need it. You will need a follow up appointment in 1 months.      Echocardiogram An echocardiogram is a procedure that uses painless sound waves (ultrasound) to produce an image of the heart. Images from an echocardiogram can provide important information about:  Signs of coronary artery disease (CAD).  Aneurysm detection. An aneurysm is a weak or damaged part of an artery wall that bulges out from the normal force of blood pumping through the body.  Heart size and shape. Changes in the size or shape of the heart can be associated with certain conditions, including heart  failure, aneurysm, and CAD.  Heart muscle function.  Heart valve function.  Signs of a past heart attack.  Fluid buildup around the heart.  Thickening of the heart muscle.  A tumor or infectious growth around the heart valves. Tell a health care provider about:  Any allergies you have.  All medicines you are taking, including vitamins, herbs, eye drops, creams, and over-the-counter medicines.  Any blood disorders you have.  Any surgeries you have had.  Any medical conditions you have.  Whether you are pregnant or may be pregnant. What are the risks? Generally, this is a safe procedure. However, problems may occur, including:  Allergic reaction to dye (contrast) that may be used during the procedure. What happens before the procedure? No specific preparation is needed. You may eat and drink normally. What happens during the procedure?   An IV tube may be inserted into one of your veins.  You may receive contrast through this tube. A contrast is an injection that improves the quality of the pictures from your heart.  A gel will be applied to your chest.  A wand-like tool (transducer) will be moved over your chest. The gel will help to transmit the sound waves from the transducer.  The sound waves will harmlessly bounce off of your heart to allow the heart images to be captured in real-time motion. The images will be recorded on a computer. The procedure may vary among health care providers and hospitals. What happens after the procedure?  You may return to your normal, everyday life, including diet, activities, and medicines, unless your health care provider tells you not to do that. Summary  An echocardiogram is a procedure that uses painless sound waves (ultrasound) to produce an image of the heart.  Images from an echocardiogram can provide important information about the size and shape of your heart, heart muscle function, heart valve function, and fluid buildup  around your heart.  You do not need to do anything to prepare before this procedure. You may eat and drink normally.  After the echocardiogram is completed, you may return to your normal, everyday life, unless your health care provider tells you not to do that. This information is not intended to replace advice given to you by your health care provider. Make sure you discuss any questions you have with your health care provider. Document Released: 07/28/2000 Document Revised: 11/21/2018 Document Reviewed: 09/02/2016 Elsevier Patient Education  2020 Else[employer] Inc. Cardiac CT Angiogram  A cardiac CT angiogram is a procedure to look at the heart and the area around the heart. It may be done to help find the cause of chest pains or other symptoms of heart disease. During this procedure, a large X-ray machine, called a CT scanner, takes detailed pictures of the heart and the surrounding area after a dye (contrast material) has been injected into blood vessels in the area. The procedure is also sometimes called a coronary CT angiogram, coronary artery scanning, or CTA. A cardiac CT angiogram allows the health care provider to see how well blood is flowing to and from the heart. The health care provider will be able to see if there are any problems, such as:  Blockage or narrowing of the coronary arteries in the heart.  Fluid around the heart.  Signs of weakness or disease in the muscles, valves, and tissues of the heart. Tell a health care provider about:  Any allergies you have. This is especially important if you have had a previous allergic reaction to contrast dye.  All medicines you are taking, including vitamins, herbs, eye drops, creams, and over-the-counter medicines.  Any blood disorders you have.  Any surgeries you have had.  Any medical conditions you have.  Whether you are pregnant or may be pregnant.  Any anxiety disorders, chronic pain, or other conditions you have that may  increase your stress or prevent you from lying still. What are the risks? Generally, this is a safe procedure. However, problems may occur, including:  Bleeding.  Infection.  Allergic reactions to medicines or dyes.  Damage to other structures or organs.  Kidney damage from the dye or contrast that is used.  Increased risk of cancer from radiation exposure. This risk is low. Talk with your health care provider about: ? The risks and benefits of testing. ? How you can receive the lowest dose of radiation. What happens before the procedure?  Wear comfortable clothing and remove any jewelry, glasses, dentures, and hearing aids.  Follow instructions from your health care provider about eating and drinking. This  may include: ? For 12 hours before the test - avoid caffeine. This includes tea, coffee, soda, energy drinks, and diet pills. Drink plenty of water or other fluids that do not have caffeine in them. Being well-hydrated can prevent complications. ? For 4-6 hours before the test - stop eating and drinking. The contrast dye can cause nausea, but this is less likely if your stomach is empty.  Ask your health care provider about changing or stopping your regular medicines. This is especially important if you are taking diabetes medicines, blood thinners, or medicines to treat erectile dysfunction. What happens during the procedure?  Hair on your chest may need to be removed so that small sticky patches called electrodes can be placed on your chest. These will transmit information that helps to monitor your heart during the test.  An IV tube will be inserted into one of your veins.  You might be given a medicine to control your heart rate during the test. This will help to ensure that good images are obtained.  You will be asked to lie on an exam table. This table will slide in and out of the CT machine during the procedure.  Contrast dye will be injected into the IV tube. You might  feel warm, or you may get a metallic taste in your mouth.  You will be given a medicine (nitroglycerin) to relax (dilate) the arteries in your heart.  The table that you are lying on will move into the CT machine tunnel for the scan.  The person running the machine will give you instructions while the scans are being done. You may be asked to: ? Keep your arms above your head. ? Hold your breath. ? Stay very still, even if the table is moving.  When the scanning is complete, you will be moved out of the machine.  The IV tube will be removed. The procedure may vary among health care providers and hospitals. What happens after the procedure?  You might feel warm, or you may get a metallic taste in your mouth from the contrast dye.  You may have a headache from the nitroglycerin.  After the procedure, drink water or other fluids to wash (flush) the contrast material out of your body.  Contact a health care provider if you have any symptoms of allergy to the contrast. These symptoms include: ? Shortness of breath. ? Rash or hives. ? A racing heartbeat.  Most people can return to their normal activities right after the procedure. Ask your health care provider what activities are safe for you.  It is up to you to get the results of your procedure. Ask your health care provider, or the department that is doing the procedure, when your results will be ready. Summary  A cardiac CT angiogram is a procedure to look at the heart and the area around the heart. It may be done to help find the cause of chest pains or other symptoms of heart disease.  During this procedure, a large X-ray machine, called a CT scanner, takes detailed pictures of the heart and the surrounding area after a dye (contrast material) has been injected into blood vessels in the area.  Ask your health care provider about changing or stopping your regular medicines before the procedure. This is especially important if you  are taking diabetes medicines, blood thinners, or medicines to treat erectile dysfunction.  After the procedure, drink water or other fluids to wash (flush) the contrast material out of  your body. This information is not intended to replace advice given to you by your health care provider. Make sure you discuss any questions you have with your health care provider. Document Released: 07/13/2008 Document Revised: 07/13/2017 Document Reviewed: 06/19/2016 Elsevier Patient Education  2020 [employer] Inc.   Carvedilol tablets What is this medicine? CARVEDILOL (KAR ve dil ol) is a beta-blocker. Beta-blockers reduce the workload on the heart and help it to beat more regularly. This medicine is used to treat high blood pressure and heart failure. This medicine may be used for other purposes; ask your health care provider or pharmacist if you have questions. COMMON BRAND NAME(S): Coreg What should I tell my health care provider before I take this medicine? They need to know if you have any of these conditions:  circulation problems  diabetes  history of heart attack or heart disease  liver disease  lung or breathing disease, like asthma or emphysema  pheochromocytoma  slow or irregular heartbeat  thyroid disease  an unusual or allergic reaction to carvedilol, other beta-blockers, medicines, foods, dyes, or preservatives  pregnant or trying to get pregnant  breast-feeding How should I use this medicine? Take this medicine by mouth with a glass of water. Follow the directions on the prescription label. It is best to take the tablets with food. Take your doses at regular intervals. Do not take your medicine more often than directed. Do not stop taking except on the advice of your doctor or health care professional. Talk to your pediatrician regarding the use of this medicine in children. Special care may be needed. Overdosage: If you think you have taken too much of this medicine contact a  poison control center or emergency room at once. NOTE: This medicine is only for you. Do not share this medicine with others. What if I miss a dose? If you miss a dose, take it as soon as you can. If it is almost time for your next dose, take only that dose. Do not take double or extra doses. What may interact with this medicine? This medicine may interact with the following medications:  certain medicines for blood pressure, heart disease, irregular heart beat  certain medicines for depression, like fluoxetine or paroxetine  certain medicines for diabetes, like glipizide or glyburide  cimetidine  clonidine  cyclosporine  digoxin  MAOIs like Carbex, Eldepryl, Marplan, Nardil, and Parnate  reserpine  rifampin This list may not describe all possible interactions. Give your health care provider a list of all the medicines, herbs, non-prescription drugs, or dietary supplements you use. Also tell them if you smoke, drink alcohol, or use illegal drugs. Some items may interact with your medicine. What should I watch for while using this medicine? Check your heart rate and blood pressure regularly while you are taking this medicine. Ask your doctor or health care professional what your heart rate and blood pressure should be, and when you should contact him or her. Do not stop taking this medicine suddenly. This could lead to serious heart-related effects. Contact your doctor or health care professional if you have difficulty breathing while taking this drug. Check your weight daily. Ask your doctor or health care professional when you should notify him/her of any weight gain. You may get drowsy or dizzy. Do not drive, use machinery, or do anything that requires mental alertness until you know how this medicine affects you. To reduce the risk of dizzy or fainting spells, do not sit or stand up quickly. Alcohol can  make you more drowsy, and increase flushing and rapid heartbeats. Avoid  alcoholic drinks. This medicine may increase blood sugar. Ask your healthcare provider if changes in diet or medicines are needed if you have diabetes. If you are going to have surgery, tell your doctor or health care professional that you are taking this medicine. What side effects may I notice from receiving this medicine? Side effects that you should report to your doctor or health care professional as soon as possible:  allergic reactions like skin rash, itching or hives, swelling of the face, lips, or tongue  breathing problems  dark urine  irregular heartbeat   signs and symptoms of high blood sugar such as being more thirsty or hungry or having to urinate more than normal. You may also feel very tired or have blurry vision.  swollen legs or ankles  vomiting  yellowing of the eyes or skin Side effects that usually do not require medical attention (report to your doctor or health care professional if they continue or are bothersome):  change in sex drive or performance  diarrhea  dry eyes (especially if wearing contact lenses)  dry, itching skin  headache  nausea  unusually tired This list may not describe all possible side effects. Call your doctor for medical advice about side effects. You may report side effects to FDA at 1-800-FDA-1088. Where should I keep my medicine? Keep out of the reach of children. Store at room temperature below 30 degrees C (86 degrees F). Protect from moisture. Keep container tightly closed. Throw away any unused medicine after the expiration date. NOTE: This sheet is a summary. It may not cover all possible information. If you have questions about this medicine, talk to your doctor, pharmacist, or health care provider.  2020 Elsevier/Gold Standard (2018-05-22 09:13:57)      Adopting a Healthy Lifestyle.  Know what a healthy weight is for you (roughly BMI <25) and aim to maintain this   Aim for 7+ servings of fruits and vegetables  daily   65-80+ fluid ounces of water or unsweet tea for healthy kidneys   Limit to max 1 drink of alcohol per day; avoid smoking/tobacco   Limit animal fats in diet for cholesterol and heart health - choose grass fed whenever available   Avoid highly processed foods, and foods high in saturated/trans fats   Aim for low stress - take time to unwind and care for your mental health   Aim for 150 min of moderate intensity exercise weekly for heart health, and weights twice weekly for bone health   Aim for 7-9 hours of sleep daily   When it comes to diets, agreement about the perfect plan isnt easy to find, even among the experts. Experts at the [employer] developed an idea known as the Healthy Eating Plate. Just imagine a plate divided into logical, healthy portions.   The emphasis is on diet quality:   Load up on vegetables and fruits - one-half of your plate: Aim for color and variety, and remember that potatoes dont count.   Go for whole grains - one-quarter of your plate: Whole wheat, barley, wheat berries, quinoa, oats, brown rice, and foods made with them. If you want pasta, go with whole wheat pasta.   Protein power - one-quarter of your plate: Fish, chicken, beans, and nuts are all healthy, versatile protein sources. Limit red meat.   The diet, however, does go beyond the plate, offering a few other suggestions.  Use healthy plant oils, such as olive, canola, soy, corn, sunflower and peanut. Check the labels, and avoid partially hydrogenated oil, which have unhealthy trans fats.   If youre thirsty, drink water. Coffee and tea are good in moderation, but skip sugary drinks and limit milk and dairy products to one or two daily servings.   The type of carbohydrate in the diet is more important than the amount. Some sources of carbohydrates, such as vegetables, fruits, whole grains, and beans-are healthier than others.   Finally, stay active  Signed,  Thomasene RippleKardie Aysel Gilchrest, DO  05/28/2019 11:16 PM    [facility]

## 2019-06-03 ENCOUNTER — Ambulatory Visit (INDEPENDENT_AMBULATORY_CARE_PROVIDER_SITE_OTHER): Payer: BC Managed Care – PPO

## 2019-06-03 DIAGNOSIS — R079 Chest pain, unspecified: Secondary | ICD-10-CM | POA: Diagnosis not present

## 2019-06-03 DIAGNOSIS — R002 Palpitations: Secondary | ICD-10-CM

## 2019-06-09 ENCOUNTER — Telehealth (HOSPITAL_COMMUNITY): Payer: Self-pay | Admitting: Emergency Medicine

## 2019-06-09 NOTE — Telephone Encounter (Signed)
Reaching out to patient to offer assistance regarding upcoming cardiac imaging study; pt verbalizes understanding of appt date/time, parking situation and where to check in, pre-test NPO status and medications ordered, and verified current allergies; name and call back number provided for further questions should they arise Junior Kenedy RN Navigator Cardiac Imaging [facility] [phone] office [phone] cell 

## 2019-06-10 LAB — BASIC METABOLIC PANEL
BUN/Creatinine Ratio: 9 (ref 9–20)
BUN: 11 mg/dL (ref 6–24)
CO2: 26 mmol/L (ref 20–29)
Calcium: 10.3 mg/dL — ABNORMAL HIGH (ref 8.7–10.2)
Chloride: 104 mmol/L (ref 96–106)
Creatinine, Ser: 1.23 mg/dL (ref 0.76–1.27)
GFR calc Af Amer: 78 mL/min/{1.73_m2} (ref >59)
GFR calc non Af Amer: 67 mL/min/{1.73_m2} (ref >59)
Glucose: 100 mg/dL — ABNORMAL HIGH (ref 65–99)
Potassium: 4.9 mmol/L (ref 3.5–5.2)
Sodium: 142 mmol/L (ref 134–144)

## 2019-06-11 ENCOUNTER — Other Ambulatory Visit: Payer: Self-pay

## 2019-06-11 ENCOUNTER — Ambulatory Visit (HOSPITAL_COMMUNITY)
Admission: RE | Admit: 2019-06-11 | Discharge: 2019-06-11 | Disposition: A | Discharge status: Home / Self Care | Payer: BC Managed Care – PPO | Source: Ambulatory Visit | Attending: Cardiology | Admitting: Cardiology

## 2019-06-11 DIAGNOSIS — R079 Chest pain, unspecified: Secondary | ICD-10-CM | POA: Insufficient documentation

## 2019-06-11 MED ORDER — NITROGLYCERIN 0.4 MG SL SUBL
SUBLINGUAL_TABLET | SUBLINGUAL | Status: AC
  Filled 2019-06-11: qty 2

## 2019-06-11 MED ORDER — METOPROLOL TARTRATE 5 MG/5ML IV SOLN
5.0000 mg | INTRAVENOUS | Status: DC | PRN
  Administered 2019-06-11: 5 mg via INTRAVENOUS

## 2019-06-11 MED ORDER — METOPROLOL TARTRATE 5 MG/5ML IV SOLN
INTRAVENOUS | Status: AC
  Filled 2019-06-11: qty 10

## 2019-06-11 MED ORDER — NITROGLYCERIN 0.4 MG SL SUBL
0.8000 mg | SUBLINGUAL_TABLET | Freq: Once | SUBLINGUAL | Status: AC
  Administered 2019-06-11: 0.8 mg via SUBLINGUAL

## 2019-06-11 MED ORDER — IOHEXOL 350 MG/ML SOLN
80.0000 mL | Freq: Once | INTRAVENOUS | Status: AC | PRN
  Administered 2019-06-11: 80 mL via INTRAVENOUS

## 2019-07-02 ENCOUNTER — Other Ambulatory Visit: Payer: BC Managed Care – PPO

## 2019-07-03 ENCOUNTER — Ambulatory Visit (INDEPENDENT_AMBULATORY_CARE_PROVIDER_SITE_OTHER): Payer: BC Managed Care – PPO | Admitting: Cardiology

## 2019-07-03 ENCOUNTER — Encounter: Payer: Self-pay | Admitting: Cardiology

## 2019-07-03 ENCOUNTER — Other Ambulatory Visit: Payer: Self-pay

## 2019-07-03 ENCOUNTER — Ambulatory Visit (INDEPENDENT_AMBULATORY_CARE_PROVIDER_SITE_OTHER): Payer: BC Managed Care – PPO

## 2019-07-03 VITALS — BP 104/90 | HR 83 | Ht 66.0 in | Wt 205.0 lb

## 2019-07-03 DIAGNOSIS — R079 Chest pain, unspecified: Secondary | ICD-10-CM | POA: Diagnosis not present

## 2019-07-03 DIAGNOSIS — R002 Palpitations: Secondary | ICD-10-CM | POA: Diagnosis not present

## 2019-07-03 DIAGNOSIS — I10 Essential (primary) hypertension: Secondary | ICD-10-CM

## 2019-07-03 MED ORDER — CARVEDILOL 12.5 MG PO TABS: 12.5000 mg | ORAL_TABLET | Freq: Two times a day (BID) | ORAL | 3 refills | Status: DC

## 2019-07-03 NOTE — Patient Instructions (Signed)
Medication Instructions:  Your physician has recommended you make the following change in your medication: INCREASE COREG TO 12.5 TWO TIMES DAILY  *If you need a refill on your cardiac medications before your next appointment, please call your pharmacy*  Lab Work: NONE  If you have labs (blood work) drawn today and your tests are completely normal, you will receive your results only by: Marland Kitchen MyChart Message (if you have MyChart) OR . A paper copy in the mail If you have any lab test that is abnormal or we need to change your treatment, we will call you to review the results.  Testing/Procedures: NONE  Follow-Up: At [facility], you and your health needs are our priority.  As part of our continuing mission to provide you with exceptional heart care, we have created designated Provider Care Teams.  These Care Teams include your primary Cardiologist (physician) and Advanced Practice Providers (APPs -  Physician Assistants and Nurse Practitioners) who all work together to provide you with the care you need, when you need it.  Your next appointment:   3 month(s)  The format for your next appointment:   In Person  Provider:   Berniece Salines, DO  Other Instructions NONE

## 2019-07-03 NOTE — Progress Notes (Signed)
Complete echocardiogram has been performed.  Jimmy Ketrina Boateng RDCS, RVT 

## 2019-07-03 NOTE — Progress Notes (Signed)
Cardiology Office Note:    Date:  07/03/2019   ID:  Gary Weaver, DOB 1966/06/23, MRN [id number]  PCP:  Lillard Anes, MD  Cardiologist:  Berniece Salines, DO  Electrophysiologist:  None   Referring MD: Lillard Anes,*   Chief Complaint  Patient presents with  . Follow-up    History of Present Illness:    Gary Weaver is a 53 y.o. male with a hx of hypertension, obesity presented on May 28, 2019 to be evaluated for chest pain.  Given the characteristics of his pain I recommended patient undergo a CTA coronaries as well as a transthoracic echocardiogram.  In addition he did tell me that he has been experiencing palpitations therefore ZIO monitor was also recommended for this patient.  In the interim the patient able to undergo this testing and is here today for follow-up visit.  Past Medical History:  Diagnosis Date  . Anxiety   . Arrhythmia   . Arthritis   . Asthma   . GERD (gastroesophageal reflux disease)   . Gluten intolerance   . Headache   . Hearing loss   . Hyperlipidemia   . Hypertension   . Memory loss   . Pancreatitis   . Pneumonia    hx of   . Vision abnormalities     Past Surgical History:  Procedure Laterality Date  . BILIARY STENT PLACEMENT  06/23/2018   Procedure: BILIARY STENT PLACEMENT;  Surgeon: Ronnette Juniper, MD;  Location: [place];  Service: Gastroenterology;;  . COLONOSCOPY    . ERCP N/A 06/23/2018   Procedure: ENDOSCOPIC RETROGRADE CHOLANGIOPANCREATOGRAPHY (ERCP);  Surgeon: Ronnette Juniper, MD;  Location: [place];  Service: Gastroenterology;  Laterality: N/A;  . ESOPHAGOGASTRODUODENOSCOPY (EGD) WITH PROPOFOL N/A 07/23/2018   Procedure: ESOPHAGOGASTRODUODENOSCOPY (EGD) WITH PROPOFOL;  Surgeon: Doran Stabler, MD;  Location: [facility];  Service: Gastroenterology;  Laterality: N/A;  . LAPAROSCOPIC CHOLECYSTECTOMY SINGLE SITE WITH INTRAOPERATIVE CHOLANGIOGRAM N/A 06/13/2018   Procedure: LAPAROSCOPIC  CHOLECYSTECTOMY SINGLE SITE WITH INTRAOPERATIVE CHOLANGIOGRAM ERAS PATHWAY;  Surgeon: Michael Boston, MD;  Location: [facility];  Service: General;  Laterality: N/A;  . SPHINCTEROTOMY  06/23/2018   Procedure: SPHINCTEROTOMY;  Surgeon: Ronnette Juniper, MD;  Location: [place];  Service: Gastroenterology;;  . Lavell Islam REMOVAL  07/23/2018   Procedure: STENT REMOVAL;  Surgeon: Doran Stabler, MD;  Location: [facility];  Service: Gastroenterology;;  . UMBILICAL HERNIA REPAIR N/A 06/13/2018   Procedure: LAPAROSCOPIC POSSIBLE OPEN UMBILICAL HERNIA;  Surgeon: Michael Boston, MD;  Location: [facility];  Service: General;  Laterality: N/A;  . UPPER GASTROINTESTINAL ENDOSCOPY    . WISDOM TOOTH EXTRACTION      Current Medications: Current Meds  Medication Sig  . albuterol (PROVENTIL HFA;VENTOLIN HFA) 108 (90 Base) MCG/ACT inhaler Inhale 2 puffs into the lungs every 6 (six) hours as needed for wheezing or shortness of breath.  . Cholecalciferol (VITAMIN D3) 50 MCG (2000 UT) TABS Take 1 tablet by mouth daily.  . cyclobenzaprine (FLEXERIL) 5 MG tablet Take 1 tablet (5 mg total) by mouth 3 (three) times daily as needed for muscle spasms.  . fexofenadine (ALLEGRA) 180 MG tablet Take 180 mg by mouth daily.   Marland Kitchen gabapentin (NEURONTIN) 300 MG capsule Take 600 mg by mouth 2 (two) times daily.   . hydrOXYzine (VISTARIL) 25 MG capsule Take 25 mg by mouth daily.   . metoCLOPramide (REGLAN) 5 MG tablet Take 5 mg by mouth at bedtime.   Marland Kitchen omeprazole (PRILOSEC) 40 MG capsule Take  40 mg by mouth daily.  . valsartan (DIOVAN) 80 MG tablet Take 1 tablet (80 mg total) by mouth daily.  . [DISCONTINUED] carvedilol (COREG) 6.25 MG tablet Take 1 tablet (6.25 mg total) by mouth 2 (two) times daily.     Allergies:   Eggs or egg-derived products, Gluten meal, and Latex   Social History   Socioeconomic History  . Marital status: Married    Spouse name: Not on file  . Number of children: 3  . Years of education: Not on file  .  Highest education level: Not on file  Occupational History  . Not on file  Social Needs  . Financial resource strain: Not on file  . Food insecurity    Worry: Not on file    Inability: Not on file  . Transportation needs    Medical: Not on file    Non-medical: Not on file  Tobacco Use  . Smoking status: Never Smoker  . Smokeless tobacco: Former Neurosurgeon    Types: Chew  Substance and Sexual Activity  . Alcohol use: Not Currently  . Drug use: No  . Sexual activity: Not on file  Lifestyle  . Physical activity    Days per week: Not on file    Minutes per session: Not on file  . Stress: Not on file  Relationships  . Social Musician on phone: Not on file    Gets together: Not on file    Attends religious service: Not on file    Active member of club or organization: Not on file    Attends meetings of clubs or organizations: Not on file    Relationship status: Not on file  Other Topics Concern  . Not on file  Social History Narrative  . Not on file     Family History: The patient's family history includes Bladder Cancer in his mother; Colon cancer in his maternal grandmother; Colon polyps in his father; Hypertension in his father; Prostate cancer in his father. There is no history of Esophageal cancer, Rectal cancer, Stomach cancer, or Pancreatic cancer.  ROS:   Review of Systems  Constitution: Negative for decreased appetite, fever and weight gain.  HENT: Negative for congestion, ear discharge, hoarse voice and sore throat.   Eyes: Negative for discharge, redness, vision loss in right eye and visual halos.  Cardiovascular: Negative for chest pain, dyspnea on exertion, leg swelling, orthopnea and palpitations.  Respiratory: Negative for cough, hemoptysis, shortness of breath and snoring.   Endocrine: Negative for heat intolerance and polyphagia.  Hematologic/Lymphatic: Negative for bleeding problem. Does not bruise/bleed easily.  Skin: Negative for flushing, nail  changes, rash and suspicious lesions.  Musculoskeletal: Negative for arthritis, joint pain, muscle cramps, myalgias, neck pain and stiffness.  Gastrointestinal: Negative for abdominal pain, bowel incontinence, diarrhea and excessive appetite.  Genitourinary: Negative for decreased libido, genital sores and incomplete emptying.  Neurological: Negative for brief paralysis, focal weakness, headaches and loss of balance.  Psychiatric/Behavioral: Negative for altered mental status, depression and suicidal ideas.  Allergic/Immunologic: Negative for HIV exposure and persistent infections.    EKGs/Labs/Other Studies Reviewed:    The following studies were reviewed today:   EKG: None today  ZIO monitor 06/03/2019 The patient wore the monitor for 3 days 2 hours. Indication: Palpitations The minimum heart rate was 44 bpm, maximum heart rate was 139 bpm, and average heart rate was 87 bpm.  The predominant underlying rhythm was Sinus Rhythm.  1 run of Supraventricular Tachycardia  occurred lasting 6 beats with a max rate of 139 bpm (avg 121 bpm).   Premature atrial complexes rare (<1.0%). Premature Ventricular complexes were rare (<1.0%). No ventricular tachycardia, No pauses, No AV block and no atrial fibrillation. No patient triggered events and diary noted.  Conclusion: This study is remarkable for 1 run of Supraventricular Tachycardia which is likely atrial tachycardia with variable block.  CT coronary IMPRESSION:06/11/2019. 1. Coronary calcium score of 0. This was 0 percentile for age and sex matched control. 2. Normal coronary origin with right dominance. 3. No evidence of CAD.  Recent Labs: 06/09/2019: BUN 11; Creatinine, Ser 1.23; Potassium 4.9; Sodium 142  Recent Lipid Panel    Component Value Date/Time   TRIG 101.0 05/10/2017 0911    Physical Exam:    VS:  BP 104/90 (BP Location: Left Arm, Patient Position: Sitting, Cuff Size: Normal)   Pulse 83   Ht 5\' 6"  (1.676 m)   Wt  205 lb (93 kg)   SpO2 97%   BMI 33.09 kg/m     Wt Readings from Last 3 Encounters:  07/03/19 205 lb (93 kg)  05/28/19 209 lb 6.4 oz (95 kg)  07/23/18 192 lb 0.3 oz (87.1 kg)     GEN: Well nourished, well developed in no acute distress HEENT: Normal NECK: No JVD; No carotid bruits LYMPHATICS: No lymphadenopathy CARDIAC: S1S2 noted,RRR, no murmurs, rubs, gallops RESPIRATORY:  Clear to auscultation without rales, wheezing or rhonchi  ABDOMEN: Soft, non-tender, non-distended, +bowel sounds, no guarding. EXTREMITIES: No edema, No cyanosis, no clubbing MUSCULOSKELETAL:  No edema; No deformity  SKIN: Warm and dry NEUROLOGIC:  Alert and oriented x 3, non-focal PSYCHIATRIC:  Normal affect, good insight  ASSESSMENT:    1. Essential hypertension    PLAN:     1.  He still hypertensive at home therefore at this time I am going to increase his carvedilol to 12.5 mg twice daily.  The patient to continue take his blood pressure as he also is on valsartan 80 mg daily.  His monitor did show evidence of atrial tachycardia and I am hoping that increasing his carvedilol will also improve his heart rate.  He is asked to continue to take his blood pressure at home and bring this diary with him at his next visit.  2.  In terms of his chest pain this may be noncardiac.  The patient is in agreement with the above plan. The patient left the office in stable condition.  The patient will follow up in 3 months.   Medication Adjustments/Labs and Tests Ordered: Current medicines are reviewed at length with the patient today.  Concerns regarding medicines are outlined above.  No orders of the defined types were placed in this encounter.  Meds ordered this encounter  Medications  . carvedilol (COREG) 12.5 MG tablet    Sig: Take 1 tablet (12.5 mg total) by mouth 2 (two) times daily.    Dispense:  180 tablet    Refill:  3    Patient Instructions  Medication Instructions:  Your physician has  recommended you make the following change in your medication: INCREASE COREG TO 12.5 TWO TIMES DAILY  *If you need a refill on your cardiac medications before your next appointment, please call your pharmacy*  Lab Work: NONE  If you have labs (blood work) drawn today and your tests are completely normal, you will receive your results only by: 14/10/19 MyChart Message (if you have MyChart) OR . A paper copy in the  mail If you have any lab test that is abnormal or we need to change your treatment, we will call you to review the results.  Testing/Procedures: NONE  Follow-Up: At [facility], you and your health needs are our priority.  As part of our continuing mission to provide you with exceptional heart care, we have created designated Provider Care Teams.  These Care Teams include your primary Cardiologist (physician) and Advanced Practice Providers (APPs -  Physician Assistants and Nurse Practitioners) who all work together to provide you with the care you need, when you need it.  Your next appointment:   3 month(s)  The format for your next appointment:   In Person  Provider:   Thomasene RippleKardie Tabatha Razzano, DO  Other Instructions NONE     Adopting a Healthy Lifestyle.  Know what a healthy weight is for you (roughly BMI <25) and aim to maintain this   Aim for 7+ servings of fruits and vegetables daily   65-80+ fluid ounces of water or unsweet tea for healthy kidneys   Limit to max 1 drink of alcohol per day; avoid smoking/tobacco   Limit animal fats in diet for cholesterol and heart health - choose grass fed whenever available   Avoid highly processed foods, and foods high in saturated/trans fats   Aim for low stress - take time to unwind and care for your mental health   Aim for 150 min of moderate intensity exercise weekly for heart health, and weights twice weekly for bone health   Aim for 7-9 hours of sleep daily   When it comes to diets, agreement about the perfect plan isnt  easy to find, even among the experts. Experts at the [employer] developed an idea known as the Healthy Eating Plate. Just imagine a plate divided into logical, healthy portions.   The emphasis is on diet quality:   Load up on vegetables and fruits - one-half of your plate: Aim for color and variety, and remember that potatoes dont count.   Go for whole grains - one-quarter of your plate: Whole wheat, barley, wheat berries, quinoa, oats, brown rice, and foods made with them. If you want pasta, go with whole wheat pasta.   Protein power - one-quarter of your plate: Fish, chicken, beans, and nuts are all healthy, versatile protein sources. Limit red meat.   The diet, however, does go beyond the plate, offering a few other suggestions.   Use healthy plant oils, such as olive, canola, soy, corn, sunflower and peanut. Check the labels, and avoid partially hydrogenated oil, which have unhealthy trans fats.   If youre thirsty, drink water. Coffee and tea are good in moderation, but skip sugary drinks and limit milk and dairy products to one or two daily servings.   The type of carbohydrate in the diet is more important than the amount. Some sources of carbohydrates, such as vegetables, fruits, whole grains, and beans-are healthier than others.   Finally, stay active  Signed, Thomasene RippleKardie Melissia Lahman, DO  07/03/2019 11:47 AM    [facility]

## 2019-09-16 ENCOUNTER — Other Ambulatory Visit: Payer: Self-pay | Admitting: Cardiology

## 2019-10-03 ENCOUNTER — Ambulatory Visit: Payer: BC Managed Care – PPO | Admitting: Cardiology

## 2019-10-27 ENCOUNTER — Other Ambulatory Visit: Payer: Self-pay

## 2019-10-27 ENCOUNTER — Ambulatory Visit: Payer: BC Managed Care – PPO | Admitting: Cardiology

## 2019-10-27 MED ORDER — FLUTICASONE PROPIONATE 50 MCG/ACT NA SUSP: 1.0000 | Freq: Every day | NASAL | 6 refills | Status: DC

## 2019-11-18 ENCOUNTER — Encounter: Payer: Self-pay | Admitting: Cardiology

## 2019-11-18 ENCOUNTER — Other Ambulatory Visit: Payer: Self-pay

## 2019-11-18 ENCOUNTER — Ambulatory Visit (INDEPENDENT_AMBULATORY_CARE_PROVIDER_SITE_OTHER): Payer: BC Managed Care – PPO | Admitting: Cardiology

## 2019-11-18 VITALS — BP 110/80 | HR 86 | Ht 66.0 in | Wt 200.0 lb

## 2019-11-18 DIAGNOSIS — I479 Paroxysmal tachycardia, unspecified: Secondary | ICD-10-CM | POA: Diagnosis not present

## 2019-11-18 DIAGNOSIS — E669 Obesity, unspecified: Secondary | ICD-10-CM | POA: Diagnosis not present

## 2019-11-18 DIAGNOSIS — I10 Essential (primary) hypertension: Secondary | ICD-10-CM | POA: Diagnosis not present

## 2019-11-18 NOTE — Patient Instructions (Signed)
Medication Instructions:  *If you need a refill on your cardiac medications before your next appointment, please call your pharmacy*  Lab Work: If you have labs (blood work) drawn today and your tests are completely normal, you will receive your results only by: Marland Kitchen MyChart Message (if you have MyChart) OR . A paper copy in the mail If you have any lab test that is abnormal or we need to change your treatment, we will call you to review the results.  Testing/Procedures: None ordered today.  Follow-Up: At [facility], you and your health needs are our priority.  As part of our continuing mission to provide you with exceptional heart care, we have created designated Provider Care Teams.  These Care Teams include your primary Cardiologist (physician) and Advanced Practice Providers (APPs -  Physician Assistants and Nurse Practitioners) who all work together to provide you with the care you need, when you need it.  We recommend signing up for the patient portal called "MyChart".  Sign up information is provided on this After Visit Summary.  MyChart is used to connect with patients for Virtual Visits (Telemedicine).  Patients are able to view lab/test results, encounter notes, upcoming appointments, etc.  Non-urgent messages can be sent to your provider as well.   To learn more about what you can do with MyChart, go to [email or web address].    Your next appointment:   6 month(s)  The format for your next appointment:   Either In Person or Virtual  Provider:    You will see Kardie Tobb, DO.

## 2019-11-18 NOTE — Progress Notes (Signed)
Cardiology Office Note:    Date:  11/18/2019   ID:  Gary Weaver, DOB [birth year], MRN [id number]  PCP:  Gary Anes, MD  Cardiologist:  Berniece Salines, DO  Electrophysiologist:  None   Referring MD: Gary Weaver,*   Chief Complaint  Patient presents with  . Follow-up  "My blood pressure has been up and down"  History of Present Illness:    Gary Weaver is a 54 y.o. male with a hx of hypertension, paroxysmal SVT presents today for follow-up visit.  He tells me his blood pressure is up and down at home but for the most part he has been able to take his antihypertensive medication.  He notes that it was an isolated event where he took his blood pressure medication and his systolic blood pressure went to the 80s-he felt dizzy to the point where his daughter and her boyfriend were there and both take his arms which helped with his blood pressure improving.  This is not happen since.  He does mention intermittent tachycardia where his heart rate is staying at 115 bpm at times.  No other complaints at this time.   Past Medical History:  Diagnosis Date  . Anxiety   . Arrhythmia   . Arthritis   . Asthma   . GERD (gastroesophageal reflux disease)   . Gluten intolerance   . Headache   . Hearing loss   . Hyperlipidemia   . Hypertension   . Memory loss   . Pancreatitis   . Pneumonia    hx of   . Vision abnormalities     Past Surgical History:  Procedure Laterality Date  . BILIARY STENT PLACEMENT  06/23/2018   Procedure: BILIARY STENT PLACEMENT;  Surgeon: Ronnette Juniper, MD;  Location: [place];  Service: Gastroenterology;;  . COLONOSCOPY    . ERCP N/A 06/23/2018   Procedure: ENDOSCOPIC RETROGRADE CHOLANGIOPANCREATOGRAPHY (ERCP);  Surgeon: Ronnette Juniper, MD;  Location: [place];  Service: Gastroenterology;  Laterality: N/A;  . ESOPHAGOGASTRODUODENOSCOPY (EGD) WITH PROPOFOL N/A 07/23/2018   Procedure: ESOPHAGOGASTRODUODENOSCOPY (EGD) WITH PROPOFOL;   Surgeon: Doran Stabler, MD;  [facility];  Service: Gastroenterology;  Laterality: N/A;  . LAPAROSCOPIC CHOLECYSTECTOMY SINGLE SITE WITH INTRAOPERATIVE CHOLANGIOGRAM N/A 06/13/2018   Procedure: LAPAROSCOPIC CHOLECYSTECTOMY SINGLE SITE WITH INTRAOPERATIVE CHOLANGIOGRAM ERAS PATHWAY;  Surgeon: Michael Boston, MD;  Location: [facility];  Service: General;  Laterality: N/A;  . SPHINCTEROTOMY  06/23/2018   Procedure: SPHINCTEROTOMY;  Surgeon: Ronnette Juniper, MD;  Location: [place];  Service: Gastroenterology;;  . Lavell Islam REMOVAL  07/23/2018   Procedure: STENT REMOVAL;  Surgeon: Doran Stabler, MD;  [facility];  Service: Gastroenterology;;  . UMBILICAL HERNIA REPAIR N/A 06/13/2018   Procedure: LAPAROSCOPIC POSSIBLE OPEN UMBILICAL HERNIA;  Surgeon: Michael Boston, MD;  Location[facility];  Service: General;  Laterality: N/A;  . UPPER GASTROINTESTINAL ENDOSCOPY    . WISDOM TOOTH EXTRACTION      Current Medications: Current Meds  Medication Sig  . albuterol (PROVENTIL HFA;VENTOLIN HFA) 108 (90 Base) MCG/ACT inhaler Inhale 2 puffs into the lungs every 6 (six) hours as needed for wheezing or shortness of breath.  . carvedilol (COREG) 12.5 MG tablet Take 1 tablet (12.5 mg total) by mouth 2 (two) times daily.  . Cholecalciferol (VITAMIN D3) 50 MCG (2000 UT) TABS Take 1 tablet by mouth daily.  . cyclobenzaprine (FLEXERIL) 5 MG tablet Take 1 tablet (5 mg total) by mouth 3 (three) times daily as needed for muscle  spasms.  . fexofenadine (ALLEGRA) 180 MG tablet Take 180 mg by mouth daily.   . fluticasone (FLONASE) 50 MCG/ACT nasal spray Place 1 spray into both nostrils daily.  Marland Kitchen gabapentin (NEURONTIN) 300 MG capsule Take 600 mg by mouth 2 (two) times daily.   . hydrOXYzine (VISTARIL) 25 MG capsule Take 12.5 mg by mouth daily.   . metoCLOPramide (REGLAN) 5 MG tablet Take 5 mg by mouth at bedtime as needed for nausea or vomiting.   Marland Kitchen omeprazole (PRILOSEC) 40 MG capsule Take 40 mg by  mouth daily.  . valsartan (DIOVAN) 80 MG tablet TAKE 1 TABLET(80 MG) BY MOUTH DAILY     Allergies:   Eggs or egg-derived products, Gluten meal, and Latex   Social History   Socioeconomic History  . Marital status: Married    Spouse name: Not on file  . Number of children: 3  . Years of education: Not on file  . Highest education level: Not on file  Occupational History  . Not on file  Tobacco Use  . Smoking status: Never Smoker  . Smokeless tobacco: Former Systems developer    Types: Chew  Substance and Sexual Activity  . Alcohol use: Not Currently  . Drug use: No  . Sexual activity: Not on file  Other Topics Concern  . Not on file  Social History Narrative  . Not on file   Social Determinants of Health   Financial Resource Strain:   . Difficulty of Paying Living Expenses:   Food Insecurity:   . Worried About Charity fundraiser in the Last Year:   . Arboriculturist in the Last Year:   Transportation Needs:   . Film/video editor (Medical):   Marland Kitchen Lack of Transportation (Non-Medical):   Physical Activity:   . Days of Exercise per Week:   . Minutes of Exercise per Session:   Stress:   . Feeling of Stress :   Social Connections:   . Frequency of Communication with Friends and Family:   . Frequency of Social Gatherings with Friends and Family:   . Attends Religious Services:   . Active Member of Clubs or Organizations:   . Attends Archivist Meetings:   Marland Kitchen Marital Status:      Family History: The patient's family history includes Bladder Cancer in his mother; Colon cancer in his maternal grandmother; Colon polyps in his father; Hypertension in his father; Prostate cancer in his father. There is no history of Esophageal cancer, Rectal cancer, Stomach cancer, or Pancreatic cancer.  ROS:   Review of Systems  Constitution: Negative for decreased appetite, fever and weight gain.  HENT: Negative for congestion, ear discharge, hoarse voice and sore throat.   Eyes:  Negative for discharge, redness, vision loss in right eye and visual halos.  Cardiovascular: Negative for chest pain, dyspnea on exertion, leg swelling, orthopnea and palpitations.  Respiratory: Negative for cough, hemoptysis, shortness of breath and snoring.   Endocrine: Negative for heat intolerance and polyphagia.  Hematologic/Lymphatic: Negative for bleeding problem. Does not bruise/bleed easily.  Skin: Negative for flushing, nail changes, rash and suspicious lesions.  Musculoskeletal: Negative for arthritis, joint pain, muscle cramps, myalgias, neck pain and stiffness.  Gastrointestinal: Negative for abdominal pain, bowel incontinence, diarrhea and excessive appetite.  Genitourinary: Negative for decreased libido, genital sores and incomplete emptying.  Neurological: Negative for brief paralysis, focal weakness, headaches and loss of balance.  Psychiatric/Behavioral: Negative for altered mental status, depression and suicidal ideas.  Allergic/Immunologic: Negative  for HIV exposure and persistent infections.    EKGs/Labs/Other Studies Reviewed:    The following studies were reviewed today:   EKG: None today  ZIO monitor 06/03/2019 The patient wore the monitor for 3 days 2 hours. Indication: Palpitations The minimum heart rate was 44 bpm, maximum heart rate was 139 bpm, and average heart rate was 87 bpm.  The predominant underlying rhythm was Sinus Rhythm.  1 run of Supraventricular Tachycardia occurred lasting 6 beats with a max rate of 139 bpm (avg 121 bpm).  Premature atrial complexes rare (<1.0%). Premature Ventricular complexes were rare (<1.0%). No ventricular tachycardia, No pauses, No AV block and no atrial fibrillation. No patient triggered events and diary noted.  Conclusion: This study is remarkable for 1 run of Supraventricular Tachycardia which is likely atrial tachycardia with variable block.  CT coronary IMPRESSION:06/11/2019. 1. Coronary calcium score of 0. This  was 0 percentile for age and sex matched control. 2. Normal coronary origin with right dominance. 3. No evidence of CAD.  Recent Labs: 06/09/2019: BUN 11; Creatinine, Ser 1.23; Potassium 4.9; Sodium 142  Recent Lipid Panel    Component Value Date/Time   TRIG 101.0 05/10/2017 0911    Physical Exam:    VS:  BP 110/80 (BP Location: Left Arm, Patient Position: Sitting, Cuff Size: Normal)   Pulse 86   Ht 5' 6"  (1.676 m)   Wt 200 lb (90.7 kg)   SpO2 97%   BMI 32.28 kg/m     Wt Readings from Last 3 Encounters:  11/18/19 200 lb (90.7 kg)  07/03/19 205 lb (93 kg)  05/28/19 209 lb 6.4 oz (95 kg)     GEN: Well nourished, well developed in no acute distress HEENT: Normal NECK: No JVD; No carotid bruits LYMPHATICS: No lymphadenopathy CARDIAC: S1S2 noted,RRR, no murmurs, rubs, gallops RESPIRATORY:  Clear to auscultation without rales, wheezing or rhonchi  ABDOMEN: Soft, non-tender, non-distended, +bowel sounds, no guarding. EXTREMITIES: No edema, No cyanosis, no clubbing MUSCULOSKELETAL:  No deformity  SKIN: Warm and dry NEUROLOGIC:  Alert and oriented x 3, non-focal PSYCHIATRIC:  Normal affect, good insight  ASSESSMENT:    1. Essential hypertension   2. Paroxysmal tachycardia (HCC)   3. Obesity (BMI 30-39.9)    PLAN:     1.  His blood pressure is acceptable today in the office.  He does monitor his blood pressure closely at home.  For now we will continue the current regimen with Coreg 12.5 mg twice a day and valsartan 80 mg daily.  2.  Palpitations has improved but his heart rate he reports goes up into the 115s.  I did educate the patient that with activities his heart rate can go up as long as his life sustaining any high 120s or experiencing lightheadedness with heart rates in the 115.   3.  Obesity-the patient has lost some weight since his last visit approximately 5 pounds, he is going to continue his lifestyle modification and increase exercise if able.   The  patient is in agreement with the above plan. The patient left the office in stable condition.  The patient will follow up in 6 months or sooner if needed.   Medication Adjustments/Labs and Tests Ordered: Current medicines are reviewed at length with the patient today.  Concerns regarding medicines are outlined above.  No orders of the defined types were placed in this encounter.  No orders of the defined types were placed in this encounter.   Patient Instructions  Medication Instructions:  *  If you need a refill on your cardiac medications before your next appointment, please call your pharmacy*  Lab Work: If you have labs (blood work) drawn today and your tests are completely normal, you will receive your results only by: Marland Kitchen MyChart Message (if you have MyChart) OR . A paper copy in the mail If you have any lab test that is abnormal or we need to change your treatment, we will call you to review the results.  Testing/Procedures: None ordered today.  Follow-Up: At [facility], you and your health needs are our priority.  As part of our continuing mission to provide you with exceptional heart care, we have created designated Provider Care Teams.  These Care Teams include your primary Cardiologist (physician) and Advanced Practice Providers (APPs -  Physician Assistants and Nurse Practitioners) who all work together to provide you with the care you need, when you need it.  We recommend signing up for the patient portal called "MyChart".  Sign up information is provided on this After Visit Summary.  MyChart is used to connect with patients for Virtual Visits (Telemedicine).  Patients are able to view lab/test results, encounter notes, upcoming appointments, etc.  Non-urgent messages can be sent to your provider as well.   To learn more about what you can do with MyChart, go to [email or web address].    Your next appointment:   6 month(s)  The format for your next appointment:     Either In Person or Virtual  Provider:    You will see Tatem Holsonback, DO.        Adopting a Healthy Lifestyle.  Know what a healthy weight is for you (roughly BMI <25) and aim to maintain this   Aim for 7+ servings of fruits and vegetables daily   65-80+ fluid ounces of water or unsweet tea for healthy kidneys   Limit to max 1 drink of alcohol per day; avoid smoking/tobacco   Limit animal fats in diet for cholesterol and heart health - choose grass fed whenever available   Avoid highly processed foods, and foods high in saturated/trans fats   Aim for low stress - take time to unwind and care for your mental health   Aim for 150 min of moderate intensity exercise weekly for heart health, and weights twice weekly for bone health   Aim for 7-9 hours of sleep daily   When it comes to diets, agreement about the perfect plan isnt easy to find, even among the experts. Experts at the [employer] developed an idea known as the Healthy Eating Plate. Just imagine a plate divided into logical, healthy portions.   The emphasis is on diet quality:   Load up on vegetables and fruits - one-half of your plate: Aim for color and variety, and remember that potatoes dont count.   Go for whole grains - one-quarter of your plate: Whole wheat, barley, wheat berries, quinoa, oats, brown rice, and foods made with them. If you want pasta, go with whole wheat pasta.   Protein power - one-quarter of your plate: Fish, chicken, beans, and nuts are all healthy, versatile protein sources. Limit red meat.   The diet, however, does go beyond the plate, offering a few other suggestions.   Use healthy plant oils, such as olive, canola, soy, corn, sunflower and peanut. Check the labels, and avoid partially hydrogenated oil, which have unhealthy trans fats.   If youre thirsty, drink water. Coffee and tea are good in moderation,  but skip sugary drinks and limit milk and dairy products to  one or two daily servings.   The type of carbohydrate in the diet is more important than the amount. Some sources of carbohydrates, such as vegetables, fruits, whole grains, and beans-are healthier than others.   Finally, stay active  Signed, Berniece Salines, DO  11/18/2019 7:10 PM    [facility]

## 2019-11-25 ENCOUNTER — Other Ambulatory Visit: Payer: Self-pay

## 2019-11-25 ENCOUNTER — Encounter: Payer: Self-pay | Admitting: Legal Medicine

## 2019-11-25 ENCOUNTER — Ambulatory Visit: Payer: Self-pay | Admitting: Legal Medicine

## 2019-11-25 VITALS — BP 110/70 | HR 88 | Temp 97.7°F | Resp 17 | Ht 66.5 in | Wt 200.4 lb

## 2019-11-25 DIAGNOSIS — Z1211 Encounter for screening for malignant neoplasm of colon: Secondary | ICD-10-CM | POA: Insufficient documentation

## 2019-11-25 DIAGNOSIS — Z024 Encounter for examination for driving license: Secondary | ICD-10-CM

## 2019-11-25 DIAGNOSIS — Z0000 Encounter for general adult medical examination without abnormal findings: Secondary | ICD-10-CM | POA: Insufficient documentation

## 2019-11-25 LAB — POCT URINALYSIS DIP (CLINITEK)
Bilirubin, UA: NEGATIVE
Blood, UA: NEGATIVE
Glucose, UA: NEGATIVE mg/dL
Ketones, POC UA: NEGATIVE mg/dL
Leukocytes, UA: NEGATIVE
Nitrite, UA: NEGATIVE
Spec Grav, UA: >=1.03 — AB (ref 1.010–1.025)
Urobilinogen, UA: 0.2 E.U./dL
pH, UA: 6 (ref 5.0–8.0)

## 2019-11-25 NOTE — Assessment & Plan Note (Signed)
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

## 2019-11-25 NOTE — Progress Notes (Signed)
Established Patient Office Visit  Subjective:  Patient ID: Gary Weaver, male    DOB: Jan 06, 1966  Age: 54 y.o. MRN: [id number]  CC:  Chief Complaint  Patient presents with  . DOT physical    HPI Gary Weaver presents for CDLs.  He has hypertension and hyperlipidemia under control.No CAD.  Past Medical History:  Diagnosis Date  . Anxiety   . Arrhythmia   . Arthritis   . Asthma   . GERD (gastroesophageal reflux disease)   . Gluten intolerance   . Headache   . Hearing loss   . Hyperlipidemia   . Hypertension   . Memory loss   . Pancreatitis   . Pneumonia    hx of   . Vision abnormalities     Past Surgical History:  Procedure Laterality Date  . BILIARY STENT PLACEMENT  06/23/2018   Procedure: BILIARY STENT PLACEMENT;  Surgeon: Ronnette Juniper, MD;  Location: [place];  Service: Gastroenterology;;  . COLONOSCOPY    . ERCP N/A 06/23/2018   Procedure: ENDOSCOPIC RETROGRADE CHOLANGIOPANCREATOGRAPHY (ERCP);  Surgeon: Ronnette Juniper, MD;  Location: [place];  Service: Gastroenterology;  Laterality: N/A;  . ESOPHAGOGASTRODUODENOSCOPY (EGD) WITH PROPOFOL N/A 07/23/2018   Procedure: ESOPHAGOGASTRODUODENOSCOPY (EGD) WITH PROPOFOL;  Surgeon: Doran Stabler, MD;  [facility];  Service: Gastroenterology;  Laterality: N/A;  . LAPAROSCOPIC CHOLECYSTECTOMY SINGLE SITE WITH INTRAOPERATIVE CHOLANGIOGRAM N/A 06/13/2018   Procedure: LAPAROSCOPIC CHOLECYSTECTOMY SINGLE SITE WITH INTRAOPERATIVE CHOLANGIOGRAM ERAS PATHWAY;  Surgeon: Michael Boston, MD;  [facility];  Service: General;  Laterality: N/A;  . SPHINCTEROTOMY  06/23/2018   Procedure: SPHINCTEROTOMY;  Surgeon: Ronnette Juniper, MD;  Location: [place];  Service: Gastroenterology;;  . Lavell Islam REMOVAL  07/23/2018   Procedure: STENT REMOVAL;  Surgeon: Doran Stabler, MD;  [facility];  Service: Gastroenterology;;  . UMBILICAL HERNIA REPAIR N/A 06/13/2018   Procedure: LAPAROSCOPIC POSSIBLE  OPEN UMBILICAL HERNIA;  Surgeon: Michael Boston, MD;  [facility];  Service: General;  Laterality: N/A;  . UPPER GASTROINTESTINAL ENDOSCOPY    . WISDOM TOOTH EXTRACTION      Family History  Problem Relation Age of Onset  . Hypertension Father   . Prostate cancer Father   . Colon polyps Father   . Bladder Cancer Mother   . Colon cancer Maternal Grandmother   . Esophageal cancer Neg Hx   . Rectal cancer Neg Hx   . Stomach cancer Neg Hx   . Pancreatic cancer Neg Hx     Social History   Socioeconomic History  . Marital status: Married    Spouse name: Not on file  . Number of children: 3  . Years of education: Not on file  . Highest education level: Not on file  Occupational History  . Occupation: Chief Strategy Officer at [employer]  . Smoking status: Never Smoker  . Smokeless tobacco: Current User    Types: Chew  . Tobacco comment: ocasionally  Substance and Sexual Activity  . Alcohol use: Yes    Alcohol/week: 2.0 standard drinks    Types: 2 Standard drinks or equivalent per week    Comment: ocassionally  . Drug use: No  . Sexual activity: Yes    Partners: Female  Other Topics Concern  . Not on file  Social History Narrative  . Not on file   Social Determinants of Health   Financial Resource Strain:   . Difficulty of Paying Living Expenses:   Food Insecurity:   .  Worried About Charity fundraiser in the Last Year:   . Arboriculturist in the Last Year:   Transportation Needs:   . Film/video editor (Medical):   Marland Kitchen Lack of Transportation (Non-Medical):   Physical Activity:   . Days of Exercise per Week:   . Minutes of Exercise per Session:   Stress:   . Feeling of Stress :   Social Connections:   . Frequency of Communication with Friends and Family:   . Frequency of Social Gatherings with Friends and Family:   . Attends Religious Services:   . Active Member of Clubs or Organizations:   . Attends Archivist Meetings:   Marland Kitchen Marital  Status:   Intimate Partner Violence:   . Fear of Current or Ex-Partner:   . Emotionally Abused:   Marland Kitchen Physically Abused:   . Sexually Abused:     Outpatient Medications Prior to Visit  Medication Sig Dispense Refill  . albuterol (PROVENTIL HFA;VENTOLIN HFA) 108 (90 Base) MCG/ACT inhaler Inhale 2 puffs into the lungs every 6 (six) hours as needed for wheezing or shortness of breath.    . Cholecalciferol (VITAMIN D3) 50 MCG (2000 UT) TABS Take 1 tablet by mouth daily.    . cyclobenzaprine (FLEXERIL) 5 MG tablet Take 1 tablet (5 mg total) by mouth 3 (three) times daily as needed for muscle spasms. 20 tablet 2  . fexofenadine (ALLEGRA) 180 MG tablet Take 180 mg by mouth daily.     . fluticasone (FLONASE) 50 MCG/ACT nasal spray Place 1 spray into both nostrils daily. 11.1 mL 6  . gabapentin (NEURONTIN) 300 MG capsule Take 600 mg by mouth 2 (two) times daily.     . hydrOXYzine (VISTARIL) 25 MG capsule Take 12.5 mg by mouth daily.     . metoCLOPramide (REGLAN) 5 MG tablet Take 5 mg by mouth at bedtime as needed for nausea or vomiting.     Marland Kitchen omeprazole (PRILOSEC) 40 MG capsule Take 40 mg by mouth daily.    . valsartan (DIOVAN) 80 MG tablet TAKE 1 TABLET(80 MG) BY MOUTH DAILY 30 tablet 3  . carvedilol (COREG) 12.5 MG tablet Take 1 tablet (12.5 mg total) by mouth 2 (two) times daily. 180 tablet 3   No facility-administered medications prior to visit.    Allergies  Allergen Reactions  . Eggs Or Egg-Derived Products Diarrhea  . Gluten Meal Nausea And Vomiting  . Latex Rash    Occurs after long use of latex    ROS Review of Systems  Constitutional: Negative.   HENT: Negative.   Eyes: Negative.   Respiratory: Negative.   Cardiovascular: Negative.   Gastrointestinal: Negative.   Genitourinary: Negative.   Musculoskeletal: Negative.   Skin: Negative.   Neurological: Negative.   Psychiatric/Behavioral: Negative.       Objective:    Physical Exam  Constitutional: He is oriented to  person, place, and time. He appears well-developed and well-nourished.  HENT:  Head: Normocephalic and atraumatic.  Right Ear: External ear normal.  Left Ear: External ear normal.  Mouth/Throat: Oropharynx is clear and moist.  Eyes: Pupils are equal, round, and reactive to light. Conjunctivae and EOM are normal.  Cardiovascular: Normal rate, regular rhythm and normal heart sounds.  Pulmonary/Chest: Effort normal and breath sounds normal.  Abdominal: Soft. Bowel sounds are normal.  Genitourinary:    Penis and rectum normal.     Genitourinary Comments: No hernias   Musculoskeletal:  General: Normal range of motion.     Cervical back: Normal range of motion and neck supple.  Neurological: He is alert and oriented to person, place, and time. He has normal reflexes.  Skin: Skin is warm and dry.  Psychiatric: He has a normal mood and affect. His behavior is normal. Judgment and thought content normal.  Vitals reviewed.   BP 110/70 (BP Location: Left Arm, Patient Position: Sitting)   Pulse 88   Temp 97.7 F (36.5 C) (Temporal)   Resp 17   Ht 5' 6.5" (1.689 m)   Wt 200 lb 6.4 oz (90.9 kg)   BMI 31.86 kg/m  Wt Readings from Last 3 Encounters:  11/25/19 200 lb 6.4 oz (90.9 kg)  11/18/19 200 lb (90.7 kg)  07/03/19 205 lb (93 kg)     Health Maintenance Due  Topic Date Due  . HIV Screening  Never done  . TETANUS/TDAP  Never done  . COLONOSCOPY  03/23/2018    There are no preventive care reminders to display for this patient.  Lab Results  Component Value Date   TSH 1.350 11/14/2016   Lab Results  Component Value Date   WBC 7.8 06/24/2018   HGB 11.7 (L) 06/24/2018   HCT 36.4 (L) 06/24/2018   MCV 88.1 06/24/2018   PLT 508 (H) 06/24/2018   Lab Results  Component Value Date   NA 142 06/09/2019   K 4.9 06/09/2019   CO2 26 06/09/2019   GLUCOSE 100 (H) 06/09/2019   BUN 11 06/09/2019   CREATININE 1.23 06/09/2019   BILITOT 1.1 06/24/2018   ALKPHOS 139 (H)  06/24/2018   AST 34 06/24/2018   ALT 32 06/24/2018   PROT 6.5 06/24/2018   ALBUMIN 3.1 (L) 06/24/2018   CALCIUM 10.3 (H) 06/09/2019   ANIONGAP 8 06/24/2018   GFR 72.02 05/07/2017   No results found for: CHOL No results found for: HDL No results found for: [facility] Lab Results  Component Value Date   TRIG 101.0 05/10/2017   No results found for: CHOLHDL No results found for: HGBA1C    Assessment & Plan:   Problem List Items Addressed This Visit      Other   Encounter for Department of Transportation (DOT) examination for driving license renewal - Primary   Relevant Orders   POCT URINALYSIS DIP (CLINITEK)      No orders of the defined types were placed in this encounter.   Follow-up: No follow-ups on file.    Reinaldo Meeker, MD

## 2019-12-09 ENCOUNTER — Other Ambulatory Visit: Payer: Self-pay | Admitting: Legal Medicine

## 2019-12-27 ENCOUNTER — Other Ambulatory Visit: Payer: Self-pay | Admitting: Legal Medicine

## 2020-01-19 ENCOUNTER — Other Ambulatory Visit: Payer: Self-pay | Admitting: Cardiology

## 2020-02-15 ENCOUNTER — Other Ambulatory Visit: Payer: Self-pay | Admitting: Legal Medicine

## 2020-04-13 ENCOUNTER — Other Ambulatory Visit: Payer: Self-pay | Admitting: Legal Medicine

## 2020-04-25 ENCOUNTER — Other Ambulatory Visit: Payer: Self-pay | Admitting: Legal Medicine

## 2020-05-07 ENCOUNTER — Telehealth: Payer: Self-pay | Admitting: *Deleted

## 2020-05-07 NOTE — Telephone Encounter (Signed)
Informed [facility] in [place] that pt's Metoprolol has been D/C.

## 2020-05-09 IMAGING — CT CT HEART MORP W/ CTA COR W/ SCORE W/ CA W/CM &/OR W/O CM
4 of 7 series · 8 of 20 positions shown, 9 images · IV contrast (APPLIED)
Comparison: None.
COMPARISON: None.

Addendum:
EXAM:
OVER-READ INTERPRETATION  CT CHEST

The following report is an over-read performed by radiologist Dr.
Ayyu Vana [REDACTED] on 06/11/2019. This
over-read does not include interpretation of cardiac or coronary
anatomy or pathology. The calcium score and coronary CTA
interpretation by the cardiologist is attached.
CLINICAL DATA: 52M with hypertension, obesity and chest pain.
Cardiac/Coronary  CT
TECHNIQUE: The patient was scanned on a [employer] Force scanner.

[Series 6: best diast 73 % · axial · 0.38mm/px · z∈[+1194,+1236]mm · 2 of 323 slices shown, 3 images]
[im 108/323  vessel]
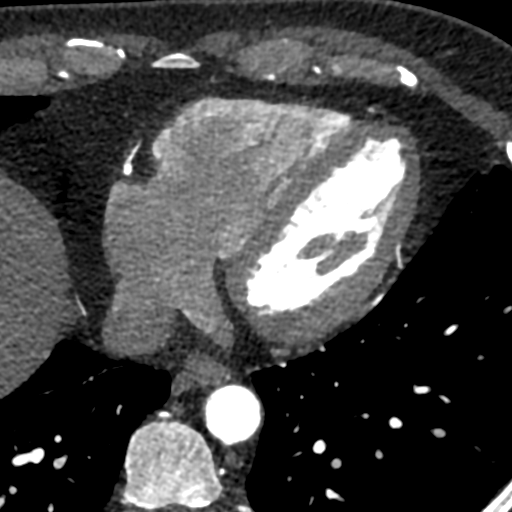
[im 108/323  lung]
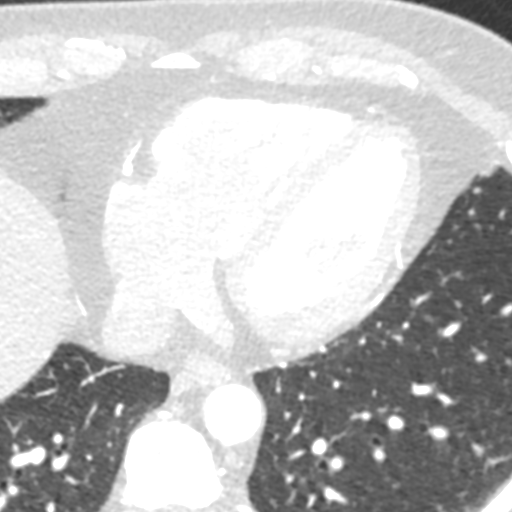
[im 215/323  vessel]
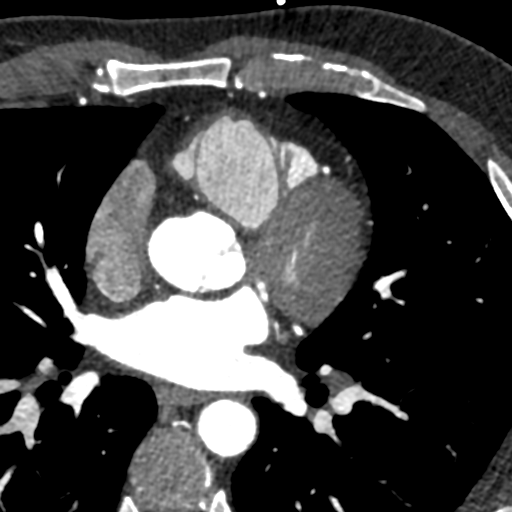

[Series 7: best syst 73 % · axial · 0.38mm/px · z∈[+1194,+1236]mm · 2 of 323 slices shown]
[im 108/323  vessel]
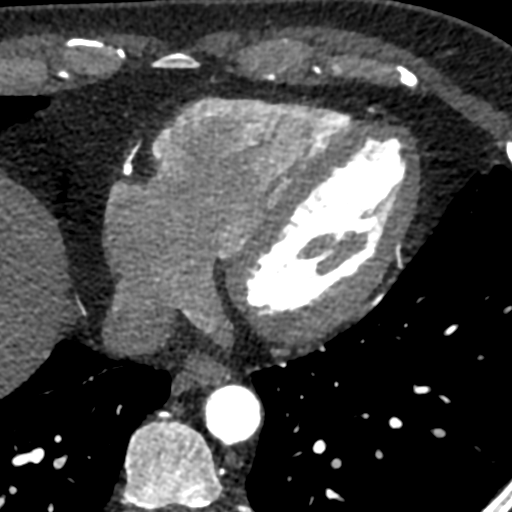
[im 215/323  vessel]
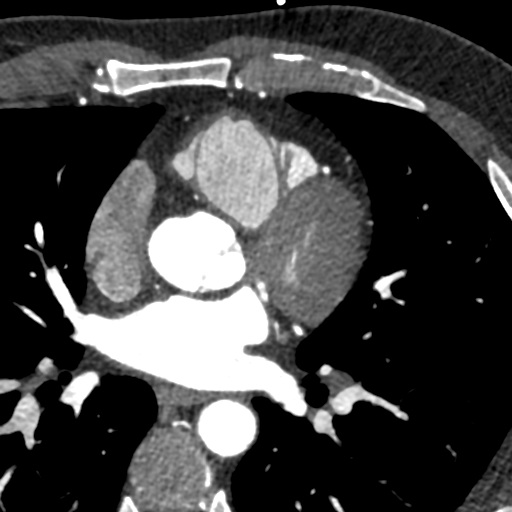

[Series 8: ts diast sharp 73 % · axial · 0.38mm/px · z∈[+1194,+1236]mm · 2 of 323 slices shown]
[im 108/323  lung]
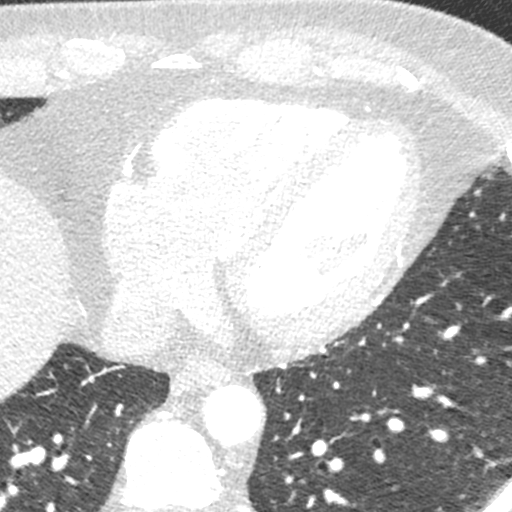
[im 215/323  lung]
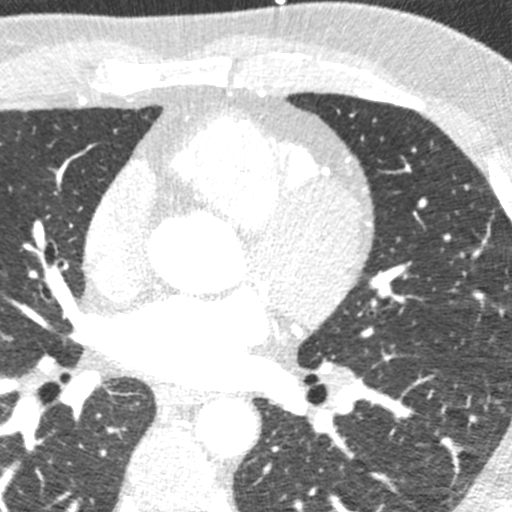

[Series 9: ts syst sharp 73 % · axial · 0.38mm/px · z∈[+1194,+1236]mm · 2 of 323 slices shown]
[im 108/323  lung]
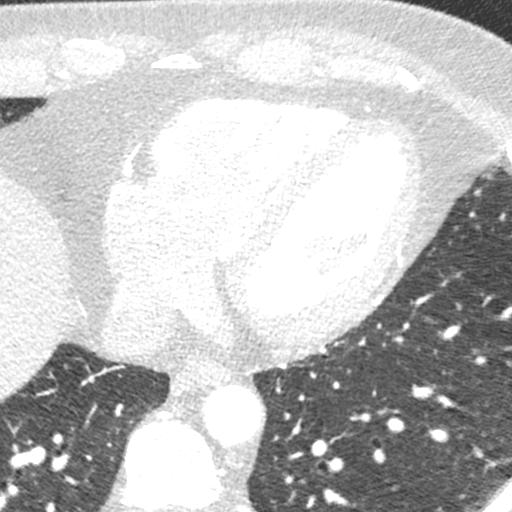
[im 215/323  lung]
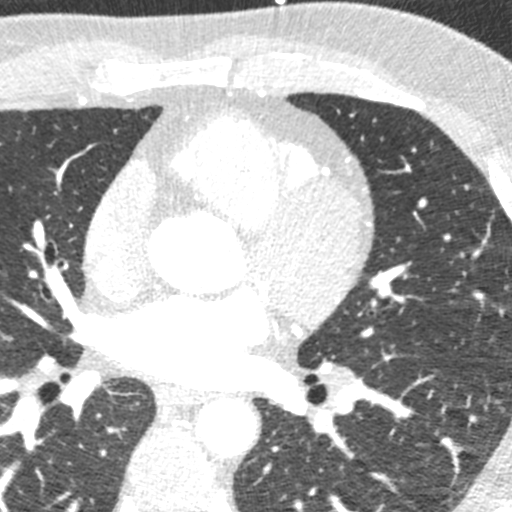

[8 of 20 positions shown; findings below may reference images not displayed]

FINDINGS: Vascular: No incidental findings.

Mediastinum/Nodes: Visualized mediastinum and hilar regions
demonstrate no enlarged lymph nodes or masses.

Lungs/Pleura: Visualized lungs show no evidence of pulmonary edema,
consolidation, pneumothorax, nodule or pleural fluid.

Upper Abdomen: Probable up attic steatosis.

Musculoskeletal: No chest wall mass or suspicious bone lesions
identified.
IMPRESSION: No significant incidental findings. Probable steatosis of the liver.
FINDINGS: A 120 kV prospective scan was triggered in the descending thoracic
aorta at 111 HU's. Axial non-contrast 3 mm slices were carried out
through the heart. The data set was analyzed on a dedicated work
station and scored using the Agatson method. Gantry rotation speed
was 250 msecs and collimation was .6 mm. No beta blockade and 0.8 mg
of sl NTG was given. The 3D data set was reconstructed in 5%
intervals of the 67-82 % of the R-R cycle. Diastolic phases were
analyzed on a dedicated work station using MPR, MIP and VRT modes.
The patient received 80 cc of contrast.

Aorta: Normal size. Ascending aorta 3.0 cm. No calcifications. No
dissection.

Aortic Valve:  Trileaflet.  No calcifications.

Coronary Arteries:  Normal coronary origin.  Right dominance.

RCA is a large dominant artery that gives rise to PDA and 3 ES
branches. There is no plaque.

Left main is a large artery that gives rise to LAD and LCX arteries.

LAD is a large vessel that has no plaque. There are two diagonals
without plaque.

LCX is a non-dominant artery that gives rise to one large OM1
branch. There is no plaque.

Other findings:

Normal pulmonary vein drainage into the left atrium.

Normal let atrial appendage without a thrombus.

Normal size of the pulmonary artery.
IMPRESSION: 1. Coronary calcium score of 0. This was 0 percentile for age and
sex matched control.

2. Normal coronary origin with right dominance.

3. No evidence of CAD.

*** End of Addendum ***
EXAM:
OVER-READ INTERPRETATION  CT CHEST

The following report is an over-read performed by radiologist Dr.
Ayyu Vana [REDACTED] on 06/11/2019. This
over-read does not include interpretation of cardiac or coronary
anatomy or pathology. The calcium score and coronary CTA
interpretation by the cardiologist is attached.
FINDINGS: Vascular: No incidental findings.

Mediastinum/Nodes: Visualized mediastinum and hilar regions
demonstrate no enlarged lymph nodes or masses.

Lungs/Pleura: Visualized lungs show no evidence of pulmonary edema,
consolidation, pneumothorax, nodule or pleural fluid.

Upper Abdomen: Probable up attic steatosis.

Musculoskeletal: No chest wall mass or suspicious bone lesions
identified.
IMPRESSION: No significant incidental findings. Probable steatosis of the liver.

## 2020-05-13 ENCOUNTER — Other Ambulatory Visit: Payer: Self-pay | Admitting: Legal Medicine

## 2020-05-17 ENCOUNTER — Other Ambulatory Visit: Payer: Self-pay | Admitting: Legal Medicine

## 2020-06-12 ENCOUNTER — Other Ambulatory Visit: Payer: Self-pay | Admitting: Legal Medicine

## 2020-06-30 ENCOUNTER — Other Ambulatory Visit: Payer: Self-pay | Admitting: Cardiology

## 2020-06-30 NOTE — Telephone Encounter (Signed)
Rx refill sent to pharmacy. 

## 2020-07-01 ENCOUNTER — Other Ambulatory Visit: Payer: Self-pay | Admitting: Cardiology

## 2020-07-17 ENCOUNTER — Other Ambulatory Visit: Payer: Self-pay | Admitting: Legal Medicine

## 2020-07-25 ENCOUNTER — Other Ambulatory Visit: Payer: Self-pay | Admitting: Cardiology

## 2020-07-27 ENCOUNTER — Other Ambulatory Visit: Payer: Self-pay

## 2020-07-27 MED ORDER — VALSARTAN 80 MG PO TABS: 80.0000 mg | ORAL_TABLET | Freq: Every day | ORAL | 0 refills | Status: DC

## 2020-10-26 ENCOUNTER — Other Ambulatory Visit: Payer: Self-pay | Admitting: Legal Medicine

## 2020-10-29 ENCOUNTER — Other Ambulatory Visit: Payer: Self-pay | Admitting: Legal Medicine

## 2020-11-11 ENCOUNTER — Ambulatory Visit: Payer: Self-pay | Admitting: Legal Medicine

## 2020-11-11 ENCOUNTER — Encounter: Payer: Self-pay | Admitting: Legal Medicine

## 2020-11-11 ENCOUNTER — Other Ambulatory Visit: Payer: Self-pay

## 2020-11-11 VITALS — BP 126/64 | HR 74 | Temp 97.7°F | Resp 16 | Ht 66.0 in | Wt 194.0 lb

## 2020-11-11 DIAGNOSIS — Z024 Encounter for examination for driving license: Secondary | ICD-10-CM

## 2020-11-11 LAB — POCT URINALYSIS DIP (CLINITEK)
Blood, UA: NEGATIVE
Glucose, UA: NEGATIVE mg/dL
Leukocytes, UA: NEGATIVE
Nitrite, UA: NEGATIVE
Spec Grav, UA: >=1.03 — AB (ref 1.010–1.025)
Urobilinogen, UA: 0.2 E.U./dL
pH, UA: 5.5 (ref 5.0–8.0)

## 2020-11-11 NOTE — Progress Notes (Signed)
Subjective:  Patient ID: Gary Weaver, male    DOB: 11-13-1965  Age: 55 y.o. MRN: [id number]  Chief Complaint  Patient presents with  . 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  He is doing Press photographer remodeling.   Current Outpatient Medications on File Prior to Visit  Medication Sig Dispense Refill  . albuterol (PROVENTIL HFA;VENTOLIN HFA) 108 (90 Base) MCG/ACT inhaler Inhale 2 puffs into the lungs every 6 (six) hours as needed for wheezing or shortness of breath.    . carvedilol (COREG) 12.5 MG tablet TAKE 1 TABLET(12.5 MG) BY MOUTH TWICE DAILY 180 tablet 3  . Cholecalciferol (VITAMIN D3) 50 MCG (2000 UT) TABS Take 1 tablet by mouth daily.    . cyclobenzaprine (FLEXERIL) 5 MG tablet TAKE 1 TABLET BY MOUTH THREE TIMES DAILY 90 tablet 2  . fexofenadine (ALLEGRA) 180 MG tablet Take 180 mg by mouth daily.     . fluticasone (FLONASE) 50 MCG/ACT nasal spray Place 1 spray into both nostrils daily. 11.1 mL 6  . gabapentin (NEURONTIN) 300 MG capsule TAKE 1 CAPSULE BY MOUTH IN THE MORNING AND 2 CAPSULES IN THE EVENING 90 capsule 5  . metoCLOPramide (REGLAN) 5 MG tablet TAKE WEEKLY AS NEEDED FOR ACID REFLUX 30 tablet 3  . omeprazole (PRILOSEC) 40 MG capsule TAKE 1 CAPSULE BY MOUTH DAILY 30 capsule 6  . valsartan (DIOVAN) 80 MG tablet Take 1 tablet (80 mg total) by mouth daily. NEEDS APPOINTMENT FOR FUTURE REFILL / 1st attempt 90 tablet 0   No current facility-administered medications on file prior to visit.   Past Medical History:  Diagnosis Date  . Anxiety   . Arrhythmia   . Arthritis   . Asthma   . GERD (gastroesophageal reflux disease)   . Gluten intolerance   . Headache   . Hearing loss   . Hyperlipidemia   . Hypertension   . Memory loss   . Pancreatitis   . Pneumonia    hx of   . Vision abnormalities    Past Surgical History:  Procedure Laterality Date  . BILIARY STENT PLACEMENT  06/23/2018   Procedure: BILIARY STENT  PLACEMENT;  Surgeon: Ronnette Juniper, MD;  Location: [place];  Service: Gastroenterology;;  . COLONOSCOPY    . ERCP N/A 06/23/2018   Procedure: ENDOSCOPIC RETROGRADE CHOLANGIOPANCREATOGRAPHY (ERCP);  Surgeon: Ronnette Juniper, MD;  Location: [place];  Service: Gastroenterology;  Laterality: N/A;  . ESOPHAGOGASTRODUODENOSCOPY (EGD) WITH PROPOFOL N/A 07/23/2018   Procedure: ESOPHAGOGASTRODUODENOSCOPY (EGD) WITH PROPOFOL;  Surgeon: Doran Stabler, MD;  Location: [facility];  Service: Gastroenterology;  Laterality: N/A;  . LAPAROSCOPIC CHOLECYSTECTOMY SINGLE SITE WITH INTRAOPERATIVE CHOLANGIOGRAM N/A 06/13/2018   Procedure: LAPAROSCOPIC CHOLECYSTECTOMY SINGLE SITE WITH INTRAOPERATIVE CHOLANGIOGRAM ERAS PATHWAY;  Surgeon: Michael Boston, MD;  Location: [facility];  Service: General;  Laterality: N/A;  . SPHINCTEROTOMY  06/23/2018   Procedure: SPHINCTEROTOMY;  Surgeon: Ronnette Juniper, MD;  Location: [place];  Service: Gastroenterology;;  . Lavell Islam REMOVAL  07/23/2018   Procedure: STENT REMOVAL;  Surgeon: Doran Stabler, MD;  Location: [facility];  Service: Gastroenterology;;  . UMBILICAL HERNIA REPAIR N/A 06/13/2018   Procedure: LAPAROSCOPIC POSSIBLE OPEN UMBILICAL HERNIA;  Surgeon: Michael Boston, MD;  Location: [facility];  Service: General;  Laterality: N/A;  . UPPER GASTROINTESTINAL ENDOSCOPY    . WISDOM TOOTH EXTRACTION      Family History  Problem Relation Age of Onset  . Hypertension Father   . Prostate  cancer Father   . Colon polyps Father   . Bladder Cancer Mother   . Colon cancer Maternal Grandmother   . Esophageal cancer Neg Hx   . Rectal cancer Neg Hx   . Stomach cancer Neg Hx   . Pancreatic cancer Neg Hx    Social History   Socioeconomic History  . Marital status: Married    Spouse name: Not on file  . Number of children: 3  . Years of education: Not on file  . Highest education level: Not on file  Occupational History  . Occupation: Chief Strategy Officer at [employer]  . Smoking status: Never Smoker  . Smokeless tobacco: Current User    Types: Chew  . Tobacco comment: ocasionally  Vaping Use  . Vaping Use: Never used  Substance and Sexual Activity  . Alcohol use: Yes    Alcohol/week: 2.0 standard drinks    Types: 2 Standard drinks or equivalent per week    Comment: ocassionally  . Drug use: No  . Sexual activity: Yes    Partners: Female  Other Topics Concern  . Not on file  Social History Narrative  . Not on file   Social Determinants of Health   Financial Resource Strain: Not on file  Food Insecurity: Not on file  Transportation Needs: Not on file  Physical Activity: Not on file  Stress: Not on file  Social Connections: Not on file    Review of Systems  Constitutional: Negative for activity change and appetite change.  HENT: Negative for congestion and sinus pain.   Eyes: Negative for visual disturbance.  Respiratory: Negative for choking and shortness of breath.   Cardiovascular: Negative for chest pain, palpitations and leg swelling.  Gastrointestinal: Negative for abdominal distention and abdominal pain.  Genitourinary: Negative.   Musculoskeletal: Positive for back pain.  Neurological: Negative.   Psychiatric/Behavioral: Negative.      Objective:  BP 126/64   Pulse 74   Temp 97.7 F (36.5 C)   Resp 16   Ht 5' 6"  (1.676 m)   Wt 194 lb (88 kg)   SpO2 97%   BMI 31.31 kg/m   BP/Weight 11/11/2020 0/86/7619 5/0/9326  Systolic BP 712 458 099  Diastolic BP 64 70 80  Wt. (Lbs) 194 200.4 200  BMI 31.31 31.86 32.28    Physical Exam Vitals reviewed.  Constitutional:      Appearance: Normal appearance.  HENT:     Head: Normocephalic.     Right Ear: Tympanic membrane normal.     Mouth/Throat:     Mouth: Mucous membranes are moist.     Pharynx: Oropharynx is clear.  Eyes:     Extraocular Movements: Extraocular movements intact.     Conjunctiva/sclera: Conjunctivae normal.     Pupils: Pupils are equal,  round, and reactive to light.  Cardiovascular:     Rate and Rhythm: Normal rate and regular rhythm.     Pulses: Normal pulses.     Heart sounds: Normal heart sounds. No murmur heard. No gallop.   Pulmonary:     Effort: Pulmonary effort is normal. No respiratory distress.     Breath sounds: Normal breath sounds. No rales.  Abdominal:     General: Abdomen is flat. Bowel sounds are normal. There is no distension.     Palpations: Abdomen is soft.     Tenderness: There is no abdominal tenderness.  Musculoskeletal:        General: Normal range of motion.  Cervical back: Normal range of motion and neck supple.  Skin:    General: Skin is warm and dry.  Neurological:     General: No focal deficit present.     Mental Status: He is alert. Mental status is at baseline.  Psychiatric:        Mood and Affect: Mood normal.        Thought Content: Thought content normal.        Judgment: Judgment normal.       Lab Results  Component Value Date   WBC 7.8 06/24/2018   HGB 11.7 (L) 06/24/2018   HCT 36.4 (L) 06/24/2018   PLT 508 (H) 06/24/2018   GLUCOSE 100 (H) 06/09/2019   TRIG 101.0 05/10/2017   ALT 32 06/24/2018   AST 34 06/24/2018   NA 142 06/09/2019   K 4.9 06/09/2019   CL 104 06/09/2019   CREATININE 1.23 06/09/2019   BUN 11 06/09/2019   CO2 26 06/09/2019   TSH 1.350 11/14/2016   INR 1.13 06/21/2018      Assessment & Plan:   1. Encounter for Department of Transportation (DOT) examination for driving license renewal - POCT URINALYSIS DIP (CLINITEK)   DOT physical performed and forms completed   Orders Placed This Encounter  Procedures  . POCT URINALYSIS DIP (CLINITEK)      I spent 30 minutes dedicated to the care of this patient on the date of this encounter to include face-to-face time with the patient, as well as:  Follow-up: Return if symptoms worsen or fail to improve.  An After Visit Summary was printed and given to the patient.  Reinaldo Meeker, MD [facility] [phone]

## 2020-11-27 ENCOUNTER — Other Ambulatory Visit: Payer: Self-pay | Admitting: Legal Medicine

## 2020-12-30 ENCOUNTER — Telehealth: Payer: Self-pay | Admitting: Cardiology

## 2020-12-30 NOTE — Telephone Encounter (Signed)
Please schedule him for a visit

## 2020-12-30 NOTE — Telephone Encounter (Signed)
Called patient. He reports that for the past month he has noticed a fullness on the left side of his chest he notices this randomly and at night more so. No dizziness, no shortness of breath. Overall he feels normal, he mentioned this to his pcp who advised he inform his cardiologist. Patent reports this sensation is not pain. Just a fullness. Will inform Dr. Harriet Masson and see how she would like to proceed.

## 2020-12-30 NOTE — Telephone Encounter (Signed)
Patient aware of his appointment on Monday 01/03/21

## 2020-12-30 NOTE — Telephone Encounter (Signed)
Pt c/o of Chest Pain: STAT if CP now or developed within 24 hours  1. Are you having CP right now? Yes on the left side of the chest   2. Are you experiencing any other symptoms (ex. SOB, nausea, vomiting, sweating)? Pressure or fullness in chest   3. How long have you been experiencing CP? A month  4. Is your CP continuous or coming and going? continous  5. Have you taken Nitroglycerin? No ?

## 2020-12-31 ENCOUNTER — Ambulatory Visit: Payer: BC Managed Care – PPO | Admitting: Legal Medicine

## 2020-12-31 ENCOUNTER — Encounter: Payer: Self-pay | Admitting: Legal Medicine

## 2020-12-31 ENCOUNTER — Other Ambulatory Visit: Payer: Self-pay

## 2020-12-31 DIAGNOSIS — L729 Follicular cyst of the skin and subcutaneous tissue, unspecified: Secondary | ICD-10-CM | POA: Diagnosis not present

## 2020-12-31 NOTE — Progress Notes (Signed)
Subjective:  Patient ID: Gary Weaver, male    DOB: Apr 08, 1966  Age: 55 y.o. MRN: [id number]  Chief Complaint  Patient presents with  . Lump under right arm pit    HPI: patient noted mass in right axilla 6 months ago, it is growing, no fever or chills, no pain.   Current Outpatient Medications on File Prior to Visit  Medication Sig Dispense Refill  . albuterol (PROVENTIL HFA;VENTOLIN HFA) 108 (90 Base) MCG/ACT inhaler Inhale 2 puffs into the lungs every 6 (six) hours as needed for wheezing or shortness of breath.    . carvedilol (COREG) 12.5 MG tablet TAKE 1 TABLET(12.5 MG) BY MOUTH TWICE DAILY 180 tablet 3  . Cholecalciferol (VITAMIN D3) 50 MCG (2000 UT) TABS Take 1 tablet by mouth daily.    . cyclobenzaprine (FLEXERIL) 5 MG tablet TAKE 1 TABLET BY MOUTH THREE TIMES DAILY 90 tablet 2  . fexofenadine (ALLEGRA) 180 MG tablet Take 180 mg by mouth daily.     . fluticasone (FLONASE) 50 MCG/ACT nasal spray Place 1 spray into both nostrils daily. 11.1 mL 6  . gabapentin (NEURONTIN) 300 MG capsule TAKE 1 CAPSULE BY MOUTH IN THE MORNING AND 2 CAPSULES IN THE EVENING 90 capsule 5  . metoCLOPramide (REGLAN) 5 MG tablet TAKE WEEKLY AS NEEDED FOR ACID REFLUX 30 tablet 3  . omeprazole (PRILOSEC) 40 MG capsule TAKE 1 CAPSULE BY MOUTH DAILY 30 capsule 6  . valsartan (DIOVAN) 80 MG tablet Take 1 tablet (80 mg total) by mouth daily. NEEDS APPOINTMENT FOR FUTURE REFILL / 1st attempt 90 tablet 0   No current facility-administered medications on file prior to visit.   Past Medical History:  Diagnosis Date  . Anxiety   . Arrhythmia   . Arthritis   . Asthma   . GERD (gastroesophageal reflux disease)   . Gluten intolerance   . Headache   . Hearing loss   . Hyperlipidemia   . Hypertension   . Memory loss   . Pancreatitis   . Pneumonia    hx of   . Vision abnormalities    Past Surgical History:  Procedure Laterality Date  . BILIARY STENT PLACEMENT  06/23/2018   Procedure: BILIARY STENT  PLACEMENT;  Surgeon: Ronnette Juniper, MD;  Location: [place];  Service: Gastroenterology;;  . COLONOSCOPY    . ERCP N/A 06/23/2018   Procedure: ENDOSCOPIC RETROGRADE CHOLANGIOPANCREATOGRAPHY (ERCP);  Surgeon: Ronnette Juniper, MD;  Location: [place];  Service: Gastroenterology;  Laterality: N/A;  . ESOPHAGOGASTRODUODENOSCOPY (EGD) WITH PROPOFOL N/A 07/23/2018   Procedure: ESOPHAGOGASTRODUODENOSCOPY (EGD) WITH PROPOFOL;  Surgeon: Doran Stabler, MD;  Location: [facility];  Service: Gastroenterology;  Laterality: N/A;  . LAPAROSCOPIC CHOLECYSTECTOMY SINGLE SITE WITH INTRAOPERATIVE CHOLANGIOGRAM N/A 06/13/2018   Procedure: LAPAROSCOPIC CHOLECYSTECTOMY SINGLE SITE WITH INTRAOPERATIVE CHOLANGIOGRAM ERAS PATHWAY;  Surgeon: Michael Boston, MD;  Location: [facility];  Service: General;  Laterality: N/A;  . SPHINCTEROTOMY  06/23/2018   Procedure: SPHINCTEROTOMY;  Surgeon: Ronnette Juniper, MD;  Location: [place];  Service: Gastroenterology;;  . Lavell Islam REMOVAL  07/23/2018   Procedure: STENT REMOVAL;  Surgeon: Doran Stabler, MD;  Location: [facility];  Service: Gastroenterology;;  . UMBILICAL HERNIA REPAIR N/A 06/13/2018   Procedure: LAPAROSCOPIC POSSIBLE OPEN UMBILICAL HERNIA;  Surgeon: Michael Boston, MD;  Location: [facility];  Service: General;  Laterality: N/A;  . UPPER GASTROINTESTINAL ENDOSCOPY    . WISDOM TOOTH EXTRACTION      Family History  Problem Relation Age of Onset  .  Hypertension Father   . Prostate cancer Father   . Colon polyps Father   . Bladder Cancer Mother   . Colon cancer Maternal Grandmother   . Esophageal cancer Neg Hx   . Rectal cancer Neg Hx   . Stomach cancer Neg Hx   . Pancreatic cancer Neg Hx    Social History   Socioeconomic History  . Marital status: Married    Spouse name: Not on file  . Number of children: 3  . Years of education: Not on file  . Highest education level: Not on file  Occupational History  . Occupation: Chief Strategy Officer at [employer]  . Smoking status: Never Smoker  . Smokeless tobacco: Current User    Types: Chew  . Tobacco comment: ocasionally  Vaping Use  . Vaping Use: Never used  Substance and Sexual Activity  . Alcohol use: Yes    Alcohol/week: 2.0 standard drinks    Types: 2 Standard drinks or equivalent per week    Comment: ocassionally  . Drug use: No  . Sexual activity: Yes    Partners: Female  Other Topics Concern  . Not on file  Social History Narrative  . Not on file   Social Determinants of Health   Financial Resource Strain: Not on file  Food Insecurity: Not on file  Transportation Needs: Not on file  Physical Activity: Not on file  Stress: Not on file  Social Connections: Not on file    Review of Systems  Constitutional: Negative for activity change and appetite change.  HENT: Negative for congestion.   Eyes: Negative for visual disturbance.  Respiratory: Negative for chest tightness and shortness of breath.   Cardiovascular: Negative for chest pain, palpitations and leg swelling.  Gastrointestinal: Negative for abdominal distention and abdominal pain.  Genitourinary: Negative for difficulty urinating.  Musculoskeletal: Negative for arthralgias and back pain.  Neurological: Negative.      Objective:  BP 102/82 (BP Location: Left Arm, Patient Position: Sitting)   Pulse 72   Temp 97.6 F (36.4 C) (Temporal)   Ht 5' 6"  (1.676 m)   Wt 186 lb (84.4 kg)   SpO2 97%   BMI 30.02 kg/m   BP/Weight 12/31/2020 11/11/2020 8/36/6294  Systolic BP 765 465 035  Diastolic BP 82 64 70  Wt. (Lbs) 186 194 200.4  BMI 30.02 31.31 31.86    Physical Exam Vitals reviewed.  Constitutional:      Appearance: Normal appearance.  Cardiovascular:     Rate and Rhythm: Normal rate and regular rhythm.     Pulses: Normal pulses.     Heart sounds: Normal heart sounds.  Pulmonary:     Effort: Pulmonary effort is normal.     Breath sounds: Normal breath sounds.  Skin:    General: Skin  is warm.     Capillary Refill: Capillary refill takes less than 2 seconds.     Comments: 8 mm follicular cyst in right axilla, freely mobile, no pain.  Neurological:     Mental Status: He is alert and oriented to person, place, and time.       Lab Results  Component Value Date   WBC 7.8 06/24/2018   HGB 11.7 (L) 06/24/2018   HCT 36.4 (L) 06/24/2018   PLT 508 (H) 06/24/2018   GLUCOSE 100 (H) 06/09/2019   TRIG 101.0 05/10/2017   ALT 32 06/24/2018   AST 34 06/24/2018   NA 142 06/09/2019   K 4.9 06/09/2019   CL  104 06/09/2019   CREATININE 1.23 06/09/2019   BUN 11 06/09/2019   CO2 26 06/09/2019   TSH 1.350 11/14/2016   INR 1.13 06/21/2018      Assessment & Plan:   Diagnoses and all orders for this visit: Follicular cyst of skin Patient has a benign follicular cyst in axilla, we can remove if needed .       Follow-up: Return if symptoms worsen or fail to improve.  An After Visit Summary was printed and given to the patient.  Reinaldo Meeker, MD [facility] [phone]

## 2021-01-03 ENCOUNTER — Encounter: Payer: Self-pay | Admitting: Cardiology

## 2021-01-03 ENCOUNTER — Ambulatory Visit: Payer: BC Managed Care – PPO | Admitting: Cardiology

## 2021-01-03 ENCOUNTER — Other Ambulatory Visit: Payer: Self-pay

## 2021-01-03 VITALS — BP 126/66 | HR 81 | Ht 66.0 in | Wt 186.4 lb

## 2021-01-03 DIAGNOSIS — E669 Obesity, unspecified: Secondary | ICD-10-CM

## 2021-01-03 DIAGNOSIS — I479 Paroxysmal tachycardia, unspecified: Secondary | ICD-10-CM | POA: Diagnosis not present

## 2021-01-03 DIAGNOSIS — I10 Essential (primary) hypertension: Secondary | ICD-10-CM

## 2021-01-03 NOTE — Progress Notes (Signed)
Cardiology Office Note:    Date:  01/04/2021   ID:  Gary Weaver, DOB Nov 25, 1965, MRN [id number]  PCP:  Lillard Anes, MD  Cardiologist:  Berniece Salines, DO  Electrophysiologist:  None   Referring MD: Lillard Anes,*   " I have had some chest pain"  History of Present Illness:    Gary Weaver is a 55 y.o. male with a hx of hypertension, paroxysmal SVT here today for follow-up visit.  The patient reports that over the last 7 days he has been doing some manual labor and he has had some chest discomfort.  He described as a left-sided pain which sometimes he feels shoots down to his neck area.  In addition he admits that sometimes at night he had some palpitations which resolved and during this time he will feel same left-sided pain.  No relieving factors.  It resolves on its own.  Last saw the patient back in April 2021 at that time he appeared to be doing well from a cardiovascular standpoint.  Past Medical History:  Diagnosis Date  . Anxiety   . Arrhythmia   . Arthritis   . Asthma   . GERD (gastroesophageal reflux disease)   . Gluten intolerance   . Headache   . Hearing loss   . Hyperlipidemia   . Hypertension   . Memory loss   . Pancreatitis   . Pneumonia    hx of   . Vision abnormalities     Past Surgical History:  Procedure Laterality Date  . BILIARY STENT PLACEMENT  06/23/2018   Procedure: BILIARY STENT PLACEMENT;  Surgeon: Ronnette Juniper, MD;  Location: [facility];  Service: Gastroenterology;;  . COLONOSCOPY    . ERCP N/A 06/23/2018   Procedure: ENDOSCOPIC RETROGRADE CHOLANGIOPANCREATOGRAPHY (ERCP);  Surgeon: Ronnette Juniper, MD;  Location: [facility];  Service: Gastroenterology;  Laterality: N/A;  . ESOPHAGOGASTRODUODENOSCOPY (EGD) WITH PROPOFOL N/A 07/23/2018   Procedure: ESOPHAGOGASTRODUODENOSCOPY (EGD) WITH PROPOFOL;  Surgeon: Doran Stabler, MD;  Location: [facility];  Service: Gastroenterology;  Laterality: N/A;  . LAPAROSCOPIC  CHOLECYSTECTOMY SINGLE SITE WITH INTRAOPERATIVE CHOLANGIOGRAM N/A 06/13/2018   Procedure: LAPAROSCOPIC CHOLECYSTECTOMY SINGLE SITE WITH INTRAOPERATIVE CHOLANGIOGRAM ERAS PATHWAY;  Surgeon: Michael Boston, MD;  Location: [facility];  Service: General;  Laterality: N/A;  . SPHINCTEROTOMY  06/23/2018   Procedure: SPHINCTEROTOMY;  Surgeon: Ronnette Juniper, MD;  Location: [facility];  Service: Gastroenterology;;  . Lavell Islam REMOVAL  07/23/2018   Procedure: STENT REMOVAL;  Surgeon: Doran Stabler, MD;  Location: [facility];  Service: Gastroenterology;;  . UMBILICAL HERNIA REPAIR N/A 06/13/2018   Procedure: LAPAROSCOPIC POSSIBLE OPEN UMBILICAL HERNIA;  Surgeon: Michael Boston, MD;  Location: [facility];  Service: General;  Laterality: N/A;  . UPPER GASTROINTESTINAL ENDOSCOPY    . WISDOM TOOTH EXTRACTION      Current Medications: Current Meds  Medication Sig  . albuterol (PROVENTIL HFA;VENTOLIN HFA) 108 (90 Base) MCG/ACT inhaler Inhale 2 puffs into the lungs every 6 (six) hours as needed for wheezing or shortness of breath.  . carvedilol (COREG) 12.5 MG tablet TAKE 1 TABLET(12.5 MG) BY MOUTH TWICE DAILY  . Cholecalciferol (VITAMIN D3) 50 MCG (2000 UT) TABS Take 1 tablet by mouth daily.  . cyclobenzaprine (FLEXERIL) 5 MG tablet TAKE 1 TABLET BY MOUTH THREE TIMES DAILY  . fexofenadine (ALLEGRA) 180 MG tablet Take 180 mg by mouth daily.   . fluticasone (FLONASE) 50 MCG/ACT nasal spray Place 1 spray into both nostrils daily.  Marland Kitchen gabapentin (NEURONTIN)  300 MG capsule TAKE 1 CAPSULE BY MOUTH IN THE MORNING AND 2 CAPSULES IN THE EVENING  . metoCLOPramide (REGLAN) 5 MG tablet TAKE WEEKLY AS NEEDED FOR ACID REFLUX  . omeprazole (PRILOSEC) 40 MG capsule TAKE 1 CAPSULE BY MOUTH DAILY  . valsartan (DIOVAN) 80 MG tablet Take 1 tablet (80 mg total) by mouth daily. NEEDS APPOINTMENT FOR FUTURE REFILL / 1st attempt     Allergies:   Eggs or egg-derived products, Gluten meal, and Latex   Social History   Socioeconomic  History  . Marital status: Married    Spouse name: Not on file  . Number of children: 3  . Years of education: Not on file  . Highest education level: Not on file  Occupational History  . Occupation: Chief Strategy Officer at [employer]  . Smoking status: Never Smoker  . Smokeless tobacco: Current User    Types: Chew  . Tobacco comment: ocasionally  Vaping Use  . Vaping Use: Never used  Substance and Sexual Activity  . Alcohol use: Yes    Alcohol/week: 2.0 standard drinks    Types: 2 Standard drinks or equivalent per week    Comment: ocassionally  . Drug use: No  . Sexual activity: Yes    Partners: Female  Other Topics Concern  . Not on file  Social History Narrative  . Not on file   Social Determinants of Health   Financial Resource Strain: Not on file  Food Insecurity: Not on file  Transportation Needs: Not on file  Physical Activity: Not on file  Stress: Not on file  Social Connections: Not on file     Family History: The patient's family history includes Bladder Cancer in his mother; Colon cancer in his maternal grandmother; Colon polyps in his father; Hypertension in his father; Prostate cancer in his father. There is no history of Esophageal cancer, Rectal cancer, Stomach cancer, or Pancreatic cancer.  ROS:   Review of Systems  Constitution: Negative for decreased appetite, fever and weight gain.  HENT: Negative for congestion, ear discharge, hoarse voice and sore throat.   Eyes: Negative for discharge, redness, vision loss in right eye and visual halos.  Cardiovascular: Negative for chest pain, dyspnea on exertion, leg swelling, orthopnea and palpitations.  Respiratory: Negative for cough, hemoptysis, shortness of breath and snoring.   Endocrine: Negative for heat intolerance and polyphagia.  Hematologic/Lymphatic: Negative for bleeding problem. Does not bruise/bleed easily.  Skin: Negative for flushing, nail changes, rash and suspicious lesions.   Musculoskeletal: Negative for arthritis, joint pain, muscle cramps, myalgias, neck pain and stiffness.  Gastrointestinal: Negative for abdominal pain, bowel incontinence, diarrhea and excessive appetite.  Genitourinary: Negative for decreased libido, genital sores and incomplete emptying.  Neurological: Negative for brief paralysis, focal weakness, headaches and loss of balance.  Psychiatric/Behavioral: Negative for altered mental status, depression and suicidal ideas.  Allergic/Immunologic: Negative for HIV exposure and persistent infections.    EKGs/Labs/Other Studies Reviewed:    The following studies were reviewed today:   EKG:  The ekg ordered today demonstrates sinus rhythm, heart rate  ZIO monitor10/20/2020 The patient wore the monitor for 3 days 2 hours. Indication: Palpitations The minimum heart rate was 44 bpm, maximum heart rate was 139 bpm, and average heart rate was 87 bpm.  The predominant underlying rhythm was Sinus Rhythm.  1 run of Supraventricular Tachycardia occurred lasting 6 beats with a max rate of 139 bpm (avg 121 bpm).  Premature atrial complexes rare (<1.0%). Premature Ventricular complexes  were rare (<1.0%). No ventricular tachycardia, No pauses, No AV block and no atrial fibrillation. No patient triggered events and diary noted.  Conclusion: This study is remarkable for 1 run of Supraventricular Tachycardia which is likely atrial tachycardia with variable block.  CT coronaryIMPRESSION:06/11/2019. 1. Coronary calcium score of 0. This was 0 percentile for age and sex matched control. 2. Normal coronary origin with right dominance. 3. No evidence of CAD.  Recent Labs: No results found for requested labs within last 8760 hours.  Recent Lipid Panel    Component Value Date/Time   TRIG 101.0 05/10/2017 0911    Physical Exam:    VS:  BP 126/66   Pulse 81   Ht 5' 6"  (1.676 m)   Wt 186 lb 6.4 oz (84.6 kg)   SpO2 98%   BMI 30.09 kg/m     Wt  Readings from Last 3 Encounters:  01/03/21 186 lb 6.4 oz (84.6 kg)  12/31/20 186 lb (84.4 kg)  11/11/20 194 lb (88 kg)     GEN: Well nourished, well developed in no acute distress HEENT: Normal NECK: No JVD; No carotid bruits LYMPHATICS: No lymphadenopathy CARDIAC: S1S2 noted,RRR, no murmurs, rubs, gallops RESPIRATORY:  Clear to auscultation without rales, wheezing or rhonchi  ABDOMEN: Soft, non-tender, non-distended, +bowel sounds, no guarding. EXTREMITIES: No edema, No cyanosis, no clubbing MUSCULOSKELETAL:  No deformity  SKIN: Warm and dry NEUROLOGIC:  Alert and oriented x 3, non-focal PSYCHIATRIC:  Normal affect, good insight  ASSESSMENT:    1. Paroxysmal tachycardia ([place])   2. Hypertension, unspecified type   3. Obesity (BMI 30-39.9)    PLAN:     1.His chest pain is atypical. He is having some intermittent palpitations which I suspect may be playing a role. I have encouraged to the patient to get KardiaMobile which will help to document EKG at the time of the episodes.    Blood pressure is acceptable, continue with current antihypertensive regimen. The patient understands the need to lose weight with diet and exercise. We have discussed specific strategies for this.  The patient is in agreement with the above plan. The patient left the office in stable condition.  The patient will follow up in   Medication Adjustments/Labs and Tests Ordered: Current medicines are reviewed at length with the patient today.  Concerns regarding medicines are outlined above.  Orders Placed This Encounter  Procedures  . EKG 12-Lead   No orders of the defined types were placed in this encounter.   Patient Instructions  Medication Instructions:  Your physician recommends that you continue on your current medications as directed. Please refer to the Current Medication list given to you today. *If you need a refill on your cardiac medications before your next appointment, please call your  pharmacy*   Lab Work: None If you have labs (blood work) drawn today and your tests are completely normal, you will receive your results only by: Marland Kitchen MyChart Message (if you have MyChart) OR . A paper copy in the mail If you have any lab test that is abnormal or we need to change your treatment, we will call you to review the results.   Testing/Procedures: None   Follow-Up: At [facility], you and your health needs are our priority.  As part of our continuing mission to provide you with exceptional heart care, we have created designated Provider Care Teams.  These Care Teams include your primary Cardiologist (physician) and Advanced Practice Providers (APPs -  Physician Assistants and Nurse  Practitioners) who all work together to provide you with the care you need, when you need it.  We recommend signing up for the patient portal called "MyChart".  Sign up information is provided on this After Visit Summary.  MyChart is used to connect with patients for Virtual Visits (Telemedicine).  Patients are able to view lab/test results, encounter notes, upcoming appointments, etc.  Non-urgent messages can be sent to your provider as well.   To learn more about what you can do with MyChart, go to [email or web address].    Your next appointment:   6 month(s)  The format for your next appointment:   In Person  Provider:   Berniece Salines, DO   Other Instructions KardiaMobile [email or web address]        FDA-cleared, clinical grade mobile EKG monitor: Jodelle Red is the most clinically-validated mobile EKG used by the world's leading cardiac care medical professionals With Basic service, know instantly if your heart rhythm is normal or if atrial fibrillation is detected, and email the last single EKG recording to yourself or your doctor Premium service, available for purchase through the Kardia app for $9.99 per month or $99 per year, includes unlimited history and  storage of your EKG recordings, a monthly EKG summary report to share with your doctor, along with the ability to track your blood pressure, activity and weight Includes one KardiaMobile phone clip FREE SHIPPING: Standard delivery 1-3 business days. Orders placed by 11:00am PST will ship that afternoon. Otherwise, will ship next business day. All orders ship via [employer] from [place], [place]         Adopting a Healthy Lifestyle.  Know what a healthy weight is for you (roughly BMI <25) and aim to maintain this   Aim for 7+ servings of fruits and vegetables daily   65-80+ fluid ounces of water or unsweet tea for healthy kidneys   Limit to max 1 drink of alcohol per day; avoid smoking/tobacco   Limit animal fats in diet for cholesterol and heart health - choose grass fed whenever available   Avoid highly processed foods, and foods high in saturated/trans fats   Aim for low stress - take time to unwind and care for your mental health   Aim for 150 min of moderate intensity exercise weekly for heart health, and weights twice weekly for bone health   Aim for 7-9 hours of sleep daily   When it comes to diets, agreement about the perfect plan isnt easy to find, even among the experts. Experts at the [employer] [place] developed an idea known as the Healthy Eating Plate. Just imagine a plate divided into logical, healthy portions.   The emphasis is on diet quality:   Load up on vegetables and fruits - one-half of your plate: Aim for color and variety, and remember that potatoes dont count.   Go for whole grains - one-quarter of your plate: Whole wheat, barley, wheat berries, quinoa, oats, brown rice, and foods made with them. If you want pasta, go with whole wheat pasta.   Protein power - one-quarter of your plate: Fish, chicken, beans, and nuts are all healthy, versatile protein sources. Limit red meat.   The diet, however, does go beyond the plate, offering a few  other suggestions.   Use healthy plant oils, such as olive, canola, soy, corn, sunflower and peanut. Check the labels, and avoid partially hydrogenated oil, which have unhealthy trans fats.   If youre thirsty, drink water. Coffee and tea  are good in moderation, but skip sugary drinks and limit milk and dairy products to one or two daily servings.   The type of carbohydrate in the diet is more important than the amount. Some sources of carbohydrates, such as vegetables, fruits, whole grains, and beans-are healthier than others.   Finally, stay active  Signed, Berniece Salines, DO  01/04/2021 7:27 PM    [facility]

## 2021-01-03 NOTE — Patient Instructions (Signed)
Medication Instructions:  Your physician recommends that you continue on your current medications as directed. Please refer to the Current Medication list given to you today. *If you need a refill on your cardiac medications before your next appointment, please call your pharmacy*   Lab Work: None If you have labs (blood work) drawn today and your tests are completely normal, you will receive your results only by: Marland Kitchen MyChart Message (if you have MyChart) OR . A paper copy in the mail If you have any lab test that is abnormal or we need to change your treatment, we will call you to review the results.   Testing/Procedures: None   Follow-Up: At [facility], you and your health needs are our priority.  As part of our continuing mission to provide you with exceptional heart care, we have created designated Provider Care Teams.  These Care Teams include your primary Cardiologist (physician) and Advanced Practice Providers (APPs -  Physician Assistants and Nurse Practitioners) who all work together to provide you with the care you need, when you need it.  We recommend signing up for the patient portal called "MyChart".  Sign up information is provided on this After Visit Summary.  MyChart is used to connect with patients for Virtual Visits (Telemedicine).  Patients are able to view lab/test results, encounter notes, upcoming appointments, etc.  Non-urgent messages can be sent to your provider as well.   To learn more about what you can do with MyChart, go to [email or web address].    Your next appointment:   6 month(s)  The format for your next appointment:   In Person  Provider:   Berniece Salines, DO   Other Instructions KardiaMobile [email or web address]        FDA-cleared, clinical grade mobile EKG monitor: Jodelle Red is the most clinically-validated mobile EKG used by the world's leading cardiac care medical professionals With Basic service, know  instantly if your heart rhythm is normal or if atrial fibrillation is detected, and email the last single EKG recording to yourself or your doctor Premium service, available for purchase through the Kardia app for $9.99 per month or $99 per year, includes unlimited history and storage of your EKG recordings, a monthly EKG summary report to share with your doctor, along with the ability to track your blood pressure, activity and weight Includes one KardiaMobile phone clip FREE SHIPPING: Standard delivery 1-3 business days. Orders placed by 11:00am PST will ship that afternoon. Otherwise, will ship next business day. All orders ship via [employer] from [place], [place]

## 2021-01-12 ENCOUNTER — Other Ambulatory Visit: Payer: Self-pay

## 2021-01-12 ENCOUNTER — Telehealth: Payer: Self-pay | Admitting: Cardiology

## 2021-01-12 MED ORDER — VALSARTAN 80 MG PO TABS: 80.0000 mg | ORAL_TABLET | Freq: Every day | ORAL | 3 refills | Status: DC

## 2021-01-12 NOTE — Telephone Encounter (Signed)
Called patient an hour ago, no answer, sent refill for blood pressure medication. Just called patient, no answer, left message for him to call back.

## 2021-01-12 NOTE — Telephone Encounter (Signed)
Patient was returning call 

## 2021-01-12 NOTE — Telephone Encounter (Signed)
Left message letting patient know I refilled his Valsartan.

## 2021-01-12 NOTE — Telephone Encounter (Signed)
New Message:     Pt would like to be seen today if possible please    Pt c/o BP issue: STAT if pt c/o blurred vision, one-sided weakness or slurred speech  1. What are your last 5 BP readings?  138/90, 126/89, 118/91 - blood pressure cuff is lighting up where it shows heart is not in rhythm pulse is low 66, which is low for him  2. Are you having any other symptoms (ex. Dizziness, headache, blurred vision, passed out)?  Not feeling right, tingling on left side of his face and shoulder area 3. What is your BP issue? High blood pressure

## 2021-01-17 ENCOUNTER — Telehealth: Payer: Self-pay | Admitting: Cardiology

## 2021-01-17 ENCOUNTER — Ambulatory Visit (INDEPENDENT_AMBULATORY_CARE_PROVIDER_SITE_OTHER): Payer: BC Managed Care – PPO

## 2021-01-17 ENCOUNTER — Telehealth: Payer: Self-pay

## 2021-01-17 DIAGNOSIS — R002 Palpitations: Secondary | ICD-10-CM | POA: Diagnosis not present

## 2021-01-17 NOTE — Telephone Encounter (Signed)
Spoke with patient about wearing a monitor for 14 days. Patient verbalizes understanding. No further questions or concerns at this time. Patient states I had to take 3 blood pressure pills yesterday. I just don't feel good. It could be from my back and not my heart but I don't understand, I just feel bad. Patient states he will set up a MyChart for himself, he just set up his parent's.

## 2021-01-17 NOTE — Telephone Encounter (Signed)
Pt has had more problems since his last visit on 05/23- blood pressure has been "going crazy at night' (going up and down). Still having a fullness feeling in chest that has been going up his neck as well.  Pt said Dr. Harriet Masson had recommended a monitor, and stress test and he would like to do both of those now. Please advise. Location preferred- [place] office  [phone]  Thank You!  Ammie Dalton

## 2021-01-17 NOTE — Telephone Encounter (Signed)
Please send a monitor to the patient to wear for 14 days. His Coronary CT in 05/2019 was normal - I do not think a stress test is warranted at this time.   Please ask him to take his blood pressure twice daily and send me that information in 1 week. I will need to see the blood pressure trend to advise medication change.

## 2021-01-17 NOTE — Telephone Encounter (Signed)
Spoke with patient, see chart.

## 2021-03-16 ENCOUNTER — Other Ambulatory Visit: Payer: Self-pay

## 2021-03-16 ENCOUNTER — Encounter: Payer: Self-pay | Admitting: Legal Medicine

## 2021-03-16 ENCOUNTER — Telehealth (INDEPENDENT_AMBULATORY_CARE_PROVIDER_SITE_OTHER): Payer: BC Managed Care – PPO | Admitting: Legal Medicine

## 2021-03-16 DIAGNOSIS — Z9189 Other specified personal risk factors, not elsewhere classified: Secondary | ICD-10-CM | POA: Diagnosis not present

## 2021-03-16 DIAGNOSIS — R6889 Other general symptoms and signs: Secondary | ICD-10-CM | POA: Diagnosis not present

## 2021-03-16 LAB — POCT INFLUENZA A/B
Influenza A, POC: NEGATIVE
Influenza B, POC: NEGATIVE

## 2021-03-16 LAB — POC COVID19 BINAXNOW: SARS Coronavirus 2 Ag: NEGATIVE

## 2021-03-16 MED ORDER — XOFLUZA (80 MG DOSE) 1 X 80 MG PO TBPK: 1.0000 | ORAL_TABLET | Freq: Once | ORAL | 0 refills | Status: DC

## 2021-03-16 MED ORDER — XOFLUZA (80 MG DOSE) 1 X 80 MG PO TBPK: 1.0000 | ORAL_TABLET | Freq: Once | ORAL | 0 refills | Status: AC

## 2021-03-16 MED ORDER — CHERATUSSIN AC 100-10 MG/5ML PO SOLN: 5.0000 mL | Freq: Three times a day (TID) | ORAL | 0 refills | Status: DC | PRN

## 2021-03-16 NOTE — Telephone Encounter (Signed)
Needing change in pharmacy due to original being closed.

## 2021-03-16 NOTE — Progress Notes (Signed)
[facility]   Virtual Visit via Video Note   This visit type was conducted due to national recommendations for restrictions regarding the COVID-19 Pandemic (e.g. social distancing) in an effort to limit this patient's exposure and mitigate transmission in our community.  Due to his co-morbid illnesses, this patient is at least at moderate risk for complications without adequate follow up.  This format is felt to be most appropriate for this patient at this time.  All issues noted in this document were discussed and addressed.  A limited physical exam was performed with this format.  A verbal consent was obtained for the virtual visit.   Date:  03/16/2021   ID:  Gary Weaver, DOB Nov 12, 1965, MRN [id number]  Patient Location: Home Provider Location: Office/Clinic  PCP:  Lillard Anes, MD   Evaluation Performed:  New Patient Evaluation  Chief Complaint:  cough  History of Present Illness:    Gary Weaver is a 55 y.o. male with cough, fever 100.3 headaches and myalgias since yesterday, esposed to influenza with mother.  The patient does have symptoms concerning for COVID-19 infection (fever, chills, cough, or new shortness of breath).    Past Medical History:  Diagnosis Date   Anxiety    Arrhythmia    Arthritis    Asthma    GERD (gastroesophageal reflux disease)    Gluten intolerance    Headache    Hearing loss    Hyperlipidemia    Hypertension    Memory loss    Pancreatitis    Pneumonia    hx of    Vision abnormalities     Past Surgical History:  Procedure Laterality Date   BILIARY STENT PLACEMENT  06/23/2018   Procedure: BILIARY STENT PLACEMENT;  Surgeon: Ronnette Juniper, MD;  Location: [facility] ENDOSCOPY;  Service: Gastroenterology;;   COLONOSCOPY     ERCP N/A 06/23/2018   Procedure: ENDOSCOPIC RETROGRADE CHOLANGIOPANCREATOGRAPHY (ERCP);  Surgeon: Ronnette Juniper, MD;  Location: [place];  Service: Gastroenterology;  Laterality: N/A;    ESOPHAGOGASTRODUODENOSCOPY (EGD) WITH PROPOFOL N/A 07/23/2018   Procedure: ESOPHAGOGASTRODUODENOSCOPY (EGD) WITH PROPOFOL;  Surgeon: Doran Stabler, MD;  Location: [facility] ENDOSCOPY;  Service: Gastroenterology;  Laterality: N/A;   LAPAROSCOPIC CHOLECYSTECTOMY SINGLE SITE WITH INTRAOPERATIVE CHOLANGIOGRAM N/A 06/13/2018   Procedure: LAPAROSCOPIC CHOLECYSTECTOMY SINGLE SITE WITH INTRAOPERATIVE CHOLANGIOGRAM ERAS PATHWAY;  Surgeon: Michael Boston, MD;  Location: [facility] ORS;  Service: General;  Laterality: N/A;   SPHINCTEROTOMY  06/23/2018   Procedure: SPHINCTEROTOMY;  Surgeon: Ronnette Juniper, MD;  Location: [facility] ENDOSCOPY;  Service: Gastroenterology;;   STENT REMOVAL  07/23/2018   Procedure: STENT REMOVAL;  Surgeon: Doran Stabler, MD;  Location: [facility] ENDOSCOPY;  Service: Gastroenterology;;   UMBILICAL HERNIA REPAIR N/A 06/13/2018   Procedure: LAPAROSCOPIC POSSIBLE OPEN UMBILICAL HERNIA;  Surgeon: Michael Boston, MD;  Location: [facility] ORS;  Service: General;  Laterality: N/A;   UPPER GASTROINTESTINAL ENDOSCOPY     WISDOM TOOTH EXTRACTION      Family History  Problem Relation Age of Onset   Hypertension Father    Prostate cancer Father    Colon polyps Father    Bladder Cancer Mother    Colon cancer Maternal Grandmother    Esophageal cancer Neg Hx    Rectal cancer Neg Hx    Stomach cancer Neg Hx    Pancreatic cancer Neg Hx     Social History   Socioeconomic History   Marital status: Married    Spouse name: Not on file   Number of children:  3   Years of education: Not on file   Highest education level: Not on file  Occupational History   Occupation: Chief Strategy Officer at [employer]  Tobacco Use   Smoking status: Never   Smokeless tobacco: Current    Types: Chew   Tobacco comments:    ocasionally  Vaping Use   Vaping Use: Never used  Substance and Sexual Activity   Alcohol use: Yes    Alcohol/week: 2.0 standard drinks    Types: 2 Standard drinks or equivalent per week    Comment:  ocassionally   Drug use: No   Sexual activity: Yes    Partners: Female  Other Topics Concern   Not on file  Social History Narrative   Not on file   Social Determinants of Health   Financial Resource Strain: Not on file  Food Insecurity: Not on file  Transportation Needs: Not on file  Physical Activity: Not on file  Stress: Not on file  Social Connections: Not on file  Intimate Partner Violence: Not on file    Outpatient Medications Prior to Visit  Medication Sig Dispense Refill   albuterol (PROVENTIL HFA;VENTOLIN HFA) 108 (90 Base) MCG/ACT inhaler Inhale 2 puffs into the lungs every 6 (six) hours as needed for wheezing or shortness of breath.     carvedilol (COREG) 12.5 MG tablet TAKE 1 TABLET(12.5 MG) BY MOUTH TWICE DAILY 180 tablet 3   Cholecalciferol (VITAMIN D3) 50 MCG (2000 UT) TABS Take 1 tablet by mouth daily.     cyclobenzaprine (FLEXERIL) 5 MG tablet TAKE 1 TABLET BY MOUTH THREE TIMES DAILY 90 tablet 2   fexofenadine (ALLEGRA) 180 MG tablet Take 180 mg by mouth daily.      fluticasone (FLONASE) 50 MCG/ACT nasal spray Place 1 spray into both nostrils daily. 11.1 mL 6   gabapentin (NEURONTIN) 300 MG capsule TAKE 1 CAPSULE BY MOUTH IN THE MORNING AND 2 CAPSULES IN THE EVENING 90 capsule 5   metoCLOPramide (REGLAN) 5 MG tablet TAKE WEEKLY AS NEEDED FOR ACID REFLUX 30 tablet 3   omeprazole (PRILOSEC) 40 MG capsule TAKE 1 CAPSULE BY MOUTH DAILY 30 capsule 6   valsartan (DIOVAN) 80 MG tablet Take 1 tablet (80 mg total) by mouth daily. 90 tablet 3   No facility-administered medications prior to visit.    Allergies:   Eggs or egg-derived products, Gluten meal, and Latex   Social History   Tobacco Use   Smoking status: Never   Smokeless tobacco: Current    Types: Chew   Tobacco comments:    ocasionally  Vaping Use   Vaping Use: Never used  Substance Use Topics   Alcohol use: Yes    Alcohol/week: 2.0 standard drinks    Types: 2 Standard drinks or equivalent per week     Comment: ocassionally   Drug use: No     Review of Systems  Constitutional:  Positive for fever. Negative for chills.  HENT:  Positive for congestion.   Eyes:  Negative for redness.  Respiratory:  Positive for cough.   Cardiovascular:  Negative for chest pain and orthopnea.  Gastrointestinal: Negative.   Genitourinary: Negative.   Musculoskeletal:  Positive for myalgias.  Neurological:  Positive for headaches.    Labs/Other Tests and Data Reviewed:    Recent Labs: No results found for requested labs within last 8760 hours.   Recent Lipid Panel Lab Results  Component Value Date/Time   TRIG 101.0 05/10/2017 09:11 AM    Wt Readings from Last  3 Encounters:  03/16/21 192 lb (87.1 kg)  01/03/21 186 lb 6.4 oz (84.6 kg)  12/31/20 186 lb (84.4 kg)     Objective:    Vital Signs:  BP 122/82   Pulse 81   Temp 99.5 F (37.5 C)   Ht 5' 6"  (1.676 m)   Wt 192 lb (87.1 kg)   BMI 30.99 kg/m    Physical Exam reviewed  ASSESSMENT & PLAN:   Diagnoses and all orders for this visit: Flu-like symptoms -     Influenza A/B Possible influenza b treat with zoflueza 67m gave coupon At increased risk of exposure to COVID-19 virus -     POC COVID-19   Covid tests negative       COVID-19 Education: The signs and symptoms of COVID-19 were discussed with the patient and how to seek care for testing (follow up with PCP or arrange E-visit). The importance of social distancing was discussed today.   I spent 20 minutes dedicated to the care of this patient on the date of this encounter to include face-to-face time with the patient, as well as:   Follow Up:  In Person prn  Signed, LReinaldo Meeker MD  03/16/2021 2:04 PM    [place]

## 2021-03-25 ENCOUNTER — Other Ambulatory Visit: Payer: Self-pay | Admitting: Legal Medicine

## 2021-06-23 ENCOUNTER — Other Ambulatory Visit: Payer: Self-pay | Admitting: Cardiology

## 2021-07-04 ENCOUNTER — Other Ambulatory Visit: Payer: Self-pay | Admitting: Cardiology

## 2021-10-25 ENCOUNTER — Other Ambulatory Visit: Payer: Self-pay

## 2021-10-25 ENCOUNTER — Encounter: Payer: Self-pay | Admitting: Legal Medicine

## 2021-10-25 ENCOUNTER — Ambulatory Visit: Payer: BC Managed Care – PPO | Admitting: Legal Medicine

## 2021-10-25 VITALS — BP 110/84 | HR 78 | Temp 98.7°F | Resp 15 | Ht 66.0 in | Wt 187.0 lb

## 2021-10-25 DIAGNOSIS — L02411 Cutaneous abscess of right axilla: Secondary | ICD-10-CM

## 2021-10-25 MED ORDER — SULFAMETHOXAZOLE-TRIMETHOPRIM 800-160 MG PO TABS: 1.0000 | ORAL_TABLET | Freq: Two times a day (BID) | ORAL | 0 refills | Status: DC

## 2021-10-25 NOTE — Progress Notes (Signed)
? ?Subjective:  ?Patient ID: Gary Weaver, male    DOB: April 14, 1966  Age: 56 y.o. MRN: [id number] ? ?Abscess right axilla for one week. Tender to touch, no fever or chills.  He has seborrheic keratosis left arm. ? ? ? ?Current Outpatient Medications on File Prior to Visit  ?Medication Sig Dispense Refill  ? albuterol (PROVENTIL HFA;VENTOLIN HFA) 108 (90 Base) MCG/ACT inhaler Inhale 2 puffs into the lungs every 6 (six) hours as needed for wheezing or shortness of breath.    ? carvedilol (COREG) 12.5 MG tablet TAKE 1 TABLET(12.5 MG) BY MOUTH TWICE DAILY 180 tablet 3  ? Cholecalciferol (VITAMIN D3) 50 MCG (2000 UT) TABS Take 1 tablet by mouth daily.    ? cyclobenzaprine (FLEXERIL) 5 MG tablet TAKE 1 TABLET BY MOUTH THREE TIMES DAILY 90 tablet 2  ? fexofenadine (ALLEGRA) 180 MG tablet Take 180 mg by mouth daily.     ? fluticasone (FLONASE) 50 MCG/ACT nasal spray Place 1 spray into both nostrils daily. 11.1 mL 6  ? gabapentin (NEURONTIN) 300 MG capsule TAKE 1 CAPSULE BY MOUTH IN THE MORNING AND 2 CAPSULES IN THE EVENING 90 capsule 5  ? metoCLOPramide (REGLAN) 5 MG tablet TAKE WEEKLY AS NEEDED FOR ACID REFLUX 30 tablet 3  ? omeprazole (PRILOSEC) 40 MG capsule TAKE 1 CAPSULE BY MOUTH DAILY 30 capsule 6  ? valsartan (DIOVAN) 80 MG tablet Take 1 tablet (80 mg total) by mouth daily. 90 tablet 3  ? ?No current facility-administered medications on file prior to visit.  ? ?Past Medical History:  ?Diagnosis Date  ? Anxiety   ? Arrhythmia   ? Arthritis   ? Asthma   ? Bile leak   ? GERD (gastroesophageal reflux disease)   ? Gluten intolerance   ? Headache   ? Hearing loss   ? Hx of acute pancreatitis 05/09/2017  ? Hyperlipidemia   ? Hypertension   ? Intra-abdominal fluid collection 06/20/2018  ? Memory loss   ? Pancreatitis   ? Pneumonia   ? hx of   ? Vision abnormalities   ? ?Past Surgical History:  ?Procedure Laterality Date  ? BILIARY STENT PLACEMENT  06/23/2018  ? Procedure: BILIARY STENT PLACEMENT;  Surgeon: Ronnette Juniper,  MD;  Location: [place];  Service: Gastroenterology;;  ? COLONOSCOPY    ? ERCP N/A 06/23/2018  ? Procedure: ENDOSCOPIC RETROGRADE CHOLANGIOPANCREATOGRAPHY (ERCP);  Surgeon: Ronnette Juniper, MD;  Location: [employer] [facility];  Service: Gastroenterology;  Laterality: N/A;  ? ESOPHAGOGASTRODUODENOSCOPY (EGD) WITH PROPOFOL N/A 07/23/2018  ? Procedure: ESOPHAGOGASTRODUODENOSCOPY (EGD) WITH PROPOFOL;  Surgeon: Doran Stabler, MD;  Location: [facility] ENDOSCOPY;  Service: Gastroenterology;  Laterality: N/A;  ? LAPAROSCOPIC CHOLECYSTECTOMY SINGLE SITE WITH INTRAOPERATIVE CHOLANGIOGRAM N/A 06/13/2018  ? Procedure: LAPAROSCOPIC CHOLECYSTECTOMY SINGLE SITE WITH INTRAOPERATIVE CHOLANGIOGRAM ERAS PATHWAY;  Surgeon: Michael Boston, MD;  Location: [facility] ORS;  Service: General;  Laterality: N/A;  ? SPHINCTEROTOMY  06/23/2018  ? Procedure: SPHINCTEROTOMY;  Surgeon: Ronnette Juniper, MD;  Location: [place];  Service: Gastroenterology;;  ? [place]  07/23/2018  ? Procedure: STENT REMOVAL;  Surgeon: Doran Stabler, MD;  Location: [facility] ENDOSCOPY;  Service: Gastroenterology;;  ? UMBILICAL HERNIA REPAIR N/A 06/13/2018  ? Procedure: LAPAROSCOPIC POSSIBLE OPEN UMBILICAL HERNIA;  Surgeon: Michael Boston, MD;  Location: [facility]S;  Service: General;  Laterality: N/A;  ? UPPER GASTROINTESTINAL ENDOSCOPY    ? WISDOM TOOTH EXTRACTION    ?  ?Family History  ?Problem Relation Age of Onset  ? Hypertension Father   ?  Prostate cancer Father   ? Colon polyps Father   ? Bladder Cancer Mother   ? Colon cancer Maternal Grandmother   ? Esophageal cancer Neg Hx   ? Rectal cancer Neg Hx   ? Stomach cancer Neg Hx   ? Pancreatic cancer Neg Hx   ? ?Social History  ? ?Socioeconomic History  ? Marital status: Married  ?  Spouse name: Not on file  ? Number of children: 3  ? Years of education: Not on file  ? Highest education level: Not on file  ?Occupational History  ? Occupation: Chief Strategy Officer at [employer]  ?Tobacco Use  ? Smoking status: Never  ? Smokeless  tobacco: Current  ?  Types: Chew  ? Tobacco comments:  ?  ocasionally  ?Vaping Use  ? Vaping Use: Never used  ?Substance and Sexual Activity  ? Alcohol use: Yes  ?  Alcohol/week: 2.0 standard drinks  ?  Types: 2 Standard drinks or equivalent per week  ?  Comment: ocassionally  ? Drug use: No  ? Sexual activity: Yes  ?  Partners: Female  ?Other Topics Concern  ? Not on file  ?Social History Narrative  ? Not on file  ? ?Social Determinants of Health  ? ?Financial Resource Strain: Not on file  ?Food Insecurity: Not on file  ?Transportation Needs: Not on file  ?Physical Activity: Not on file  ?Stress: Not on file  ?Social Connections: Not on file  ? ? ?Review of Systems  ?Constitutional:  Negative for chills, fatigue, fever and unexpected weight change.  ?HENT:  Negative for congestion, ear pain, sinus pain and sore throat.   ?Eyes:  Negative for visual disturbance.  ?Respiratory:  Negative for cough and shortness of breath.   ?Cardiovascular:  Negative for chest pain and palpitations.  ?Gastrointestinal:  Negative for abdominal pain, blood in stool, constipation, diarrhea, nausea and vomiting.  ?Endocrine: Negative for polydipsia.  ?Genitourinary:  Negative for dysuria.  ?Musculoskeletal:  Negative for back pain.  ?Skin:  Negative for rash.  ?Neurological:  Negative for headaches.  ? ? ?Objective:  ?BP 110/84 (BP Location: Right Arm)   Pulse 78   Temp 98.7 ?F (37.1 ?C)   Resp 15   Ht 5' 6"  (1.676 m)   Wt 187 lb (84.8 kg)   SpO2 96%   BMI 30.18 kg/m?  ? ?BP/Weight 10/25/2021 03/16/2021 01/03/2021  ?Systolic BP 629 476 546  ?Diastolic BP 84 82 66  ?Wt. (Lbs) 187 192 186.4  ?BMI 30.18 30.99 30.09  ? ? ?Physical Exam ?Vitals reviewed.  ?Constitutional:   ?   General: He is not in acute distress. ?   Appearance: Normal appearance.  ?Eyes:  ?   Conjunctiva/sclera: Conjunctivae normal.  ?   Pupils: Pupils are equal, round, and reactive to light.  ?Cardiovascular:  ?   Rate and Rhythm: Normal rate and regular rhythm.  ?    Pulses: Normal pulses.  ?   Heart sounds: Normal heart sounds. No murmur heard. ?  No gallop.  ?Pulmonary:  ?   Effort: Pulmonary effort is normal. No respiratory distress.  ?   Breath sounds: Normal breath sounds. No wheezing.  ?Musculoskeletal:  ?   Cervical back: Normal range of motion.  ?   Right lower leg: No edema.  ?   Left lower leg: No edema.  ?Skin: ?   General: Skin is warm.  ?   Capillary Refill: Capillary refill takes less than 2 seconds.  ?   Comments:  2 by 2 cm abscess left axilla, no pointing  ?Neurological:  ?   General: No focal deficit present.  ?   Mental Status: He is alert and oriented to person, place, and time. Mental status is at baseline.  ?   Gait: Gait normal.  ?   Deep Tendon Reflexes: Reflexes normal.  ? ? ? ?  ? ?Lab Results  ?Component Value Date  ? WBC 7.8 06/24/2018  ? HGB 11.7 (L) 06/24/2018  ? HCT 36.4 (L) 06/24/2018  ? PLT 508 (H) 06/24/2018  ? GLUCOSE 100 (H) 06/09/2019  ? TRIG 101.0 05/10/2017  ? ALT 32 06/24/2018  ? AST 34 06/24/2018  ? NA 142 06/09/2019  ? K 4.9 06/09/2019  ? CL 104 06/09/2019  ? CREATININE 1.23 06/09/2019  ? BUN 11 06/09/2019  ? CO2 26 06/09/2019  ? TSH 1.350 11/14/2016  ? INR 1.13 06/21/2018  ? ? ? ? ?Assessment & Plan:  ? ?Problem List Items Addressed This Visit   ?None ?Visit Diagnoses   ? ? Abscess of right axilla    -  Primary  ? Relevant Medications  ? sulfamethoxazole-trimethoprim (BACTRIM DS) 800-160 MG tablet ?Patient has abscess right axilla, treat with warm compresses and sulfa drug, if worsens see for I & D  ? ?  ?. ? ?Meds ordered this encounter  ?Medications  ? sulfamethoxazole-trimethoprim (BACTRIM DS) 800-160 MG tablet  ?  Sig: Take 1 tablet by mouth 2 (two) times daily.  ?  Dispense:  20 tablet  ?  Refill:  0  ? ? ?  ? ?Follow-up: Return in about 1 week (around 11/01/2021) for abscess. ? ?An After Visit Summary was printed and given to the patient. ? ?Reinaldo Meeker, MD ?[place] ?[phone] ?

## 2021-11-22 ENCOUNTER — Other Ambulatory Visit: Payer: Self-pay | Admitting: Cardiology

## 2021-11-22 ENCOUNTER — Other Ambulatory Visit: Payer: Self-pay | Admitting: Legal Medicine

## 2021-11-22 NOTE — Telephone Encounter (Signed)
Refill sent to pharmacy.   

## 2021-12-29 ENCOUNTER — Other Ambulatory Visit: Payer: Self-pay | Admitting: Cardiology

## 2022-01-27 ENCOUNTER — Other Ambulatory Visit: Payer: Self-pay | Admitting: Legal Medicine

## 2022-02-13 NOTE — Progress Notes (Unsigned)
Subjective:  Patient ID: Gary Weaver, male    DOB: Jan 11, 1966  Age: 56 y.o. MRN: [id number]  Chief Complaint  Patient presents with   Gastroesophageal Reflux   Hypertension    HPI  Pt presents for follow-up of chronic medical problems of GERD. He is followed by Dr Harriet Masson, cardiology for HTN and SVT. Hyperlipidemia diagnosis noted per EMR review, no recent lipid panel, pt is not currently on statin therapy. He is followed by [facility] for celiac disease, Alonza Bogus, PA-C. Reports he is scheduled to f/u with GI in the near future. States he is having increased insomnia over the past few months. Drinks some caffeine in the morning, [place]. Treatment has included OTC sleep aid from [place]. He denies napping during the day. 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   GERD, follow up:  The patient was last seen for GERD 11 months ago. Current treatment consist of: Prilosec and Reglan. He reports excellent compliance with treatment. He is not having side effects. Marland Kitchen He is NOT experiencing abdominal bloating or belching   Hypertension,  follow up: Management includes coreg and valsartan. Followed by Cardiology, Dr Harriet Masson He reports excellent compliance with treatment. He is not having side effects.  He is following a  Gluten free  diet. He is not exercising. He does not smoke. Use of agents associated with hypertension: none.     Pertinent labs: Lab Results  Component Value Date   TRIG 101.0 05/10/2017   Lab Results  Component Value Date   NA 142 06/09/2019   K 4.9 06/09/2019   CREATININE 1.23 06/09/2019   GFRNONAA 67 06/09/2019   GLUCOSE 100 (H) 06/09/2019       --------------------------------------------------------------------------------------------------- d Current Outpatient Medications on File Prior to Visit  Medication Sig Dispense Refill   albuterol (PROVENTIL HFA;VENTOLIN HFA) 108 (90 Base) MCG/ACT inhaler  Inhale 2 puffs into the lungs every 6 (six) hours as needed for wheezing or shortness of breath.     carvedilol (COREG) 12.5 MG tablet TAKE 1 TABLET(12.5 MG) BY MOUTH TWICE DAILY 180 tablet 3   Cholecalciferol (VITAMIN D3) 50 MCG (2000 UT) TABS Take 1 tablet by mouth daily.     cyclobenzaprine (FLEXERIL) 5 MG tablet TAKE 1 TABLET BY MOUTH THREE TIMES DAILY 90 tablet 2   fexofenadine (ALLEGRA) 180 MG tablet Take 180 mg by mouth daily.      fluticasone (FLONASE) 50 MCG/ACT nasal spray Place 1 spray into both nostrils daily. 11.1 mL 6   gabapentin (NEURONTIN) 300 MG capsule TAKE ONE CAPSULE BY MOUTH IN THE MORNING AND 2 CAPSULES IN THE EVENING 90 capsule 0   metoCLOPramide (REGLAN) 5 MG tablet TAKE WEEKLY AS NEEDED FOR ACID REFLUX 30 tablet 3   omeprazole (PRILOSEC) 40 MG capsule TAKE 1 CAPSULE BY MOUTH DAILY 30 capsule 6   valsartan (DIOVAN) 80 MG tablet TAKE 1 TABLET(80 MG) BY MOUTH DAILY 15 tablet 0   No current facility-administered medications on file prior to visit.   Past Medical History:  Diagnosis Date   Anxiety    Arrhythmia    Arthritis    Asthma    Bile leak    GERD (gastroesophageal reflux disease)    Gluten intolerance    Headache    Hearing loss    Hx of acute pancreatitis 05/09/2017   Hyperlipidemia    Hypertension    Intra-abdominal fluid collection 06/20/2018   Memory loss    Pancreatitis  Pneumonia    hx of    Vision abnormalities    Past Surgical History:  Procedure Laterality Date   BILIARY STENT PLACEMENT  06/23/2018   Procedure: BILIARY STENT PLACEMENT;  Surgeon: Ronnette Juniper, MD;  Location: [facility] ENDOSCOPY;  Service: Gastroenterology;;   COLONOSCOPY     ERCP N/A 06/23/2018   Procedure: ENDOSCOPIC RETROGRADE CHOLANGIOPANCREATOGRAPHY (ERCP);  Surgeon: Ronnette Juniper, MD;  Location: [facility];  Service: Gastroenterology;  Laterality: N/A;   ESOPHAGOGASTRODUODENOSCOPY (EGD) WITH PROPOFOL N/A 07/23/2018   Procedure: ESOPHAGOGASTRODUODENOSCOPY (EGD) WITH PROPOFOL;   Surgeon: Doran Stabler, MD;  Location: [facility] ENDOSCOPY;  Service: Gastroenterology;  Laterality: N/A;   LAPAROSCOPIC CHOLECYSTECTOMY SINGLE SITE WITH INTRAOPERATIVE CHOLANGIOGRAM N/A 06/13/2018   Procedure: LAPAROSCOPIC CHOLECYSTECTOMY SINGLE SITE WITH INTRAOPERATIVE CHOLANGIOGRAM ERAS PATHWAY;  Surgeon: Michael Boston, MD;  Location: [facility] ORS;  Service: General;  Laterality: N/A;   SPHINCTEROTOMY  06/23/2018   Procedure: SPHINCTEROTOMY;  Surgeon: Ronnette Juniper, MD;  Location: [facility] ENDOSCOPY;  Service: Gastroenterology;;   STENT REMOVAL  07/23/2018   Procedure: STENT REMOVAL;  Surgeon: Doran Stabler, MD;  Location: [facility] ENDOSCOPY;  Service: Gastroenterology;;   UMBILICAL HERNIA REPAIR N/A 06/13/2018   Procedure: LAPAROSCOPIC POSSIBLE OPEN UMBILICAL HERNIA;  Surgeon: Michael Boston, MD;  Location: [facility] ORS;  Service: General;  Laterality: N/A;   UPPER GASTROINTESTINAL ENDOSCOPY     WISDOM TOOTH EXTRACTION      Family History  Problem Relation Age of Onset   Hypertension Father    Prostate cancer Father    Colon polyps Father    Bladder Cancer Mother    Colon cancer Maternal Grandmother    Esophageal cancer Neg Hx    Rectal cancer Neg Hx    Stomach cancer Neg Hx    Pancreatic cancer Neg Hx    Social History   Socioeconomic History   Marital status: Married    Spouse name: Not on file   Number of children: 3   Years of education: Not on file   Highest education level: Not on file  Occupational History   Occupation: Chief Strategy Officer at [employer]  Tobacco Use   Smoking status: Never   Smokeless tobacco: Current    Types: Chew   Tobacco comments:    ocasionally  Vaping Use   Vaping Use: Never used  Substance and Sexual Activity   Alcohol use: Yes    Alcohol/week: 2.0 standard drinks of alcohol    Types: 2 Standard drinks or equivalent per week    Comment: ocassionally   Drug use: No   Sexual activity: Yes    Partners: Female  Other Topics Concern   Not on file  Social  History Narrative   Not on file   Social Determinants of Health   Financial Resource Strain: Not on file  Food Insecurity: Not on file  Transportation Needs: Not on file  Physical Activity: Not on file  Stress: Not on file  Social Connections: Not on file    Review of Systems  Constitutional:  Negative for chills, diaphoresis, fatigue and fever.  HENT:  Negative for congestion, ear pain and sore throat.   Respiratory:  Negative for cough and shortness of breath.   Cardiovascular:  Negative for chest pain and leg swelling.  Gastrointestinal:  Negative for abdominal pain, constipation, diarrhea, nausea and vomiting.  Genitourinary:  Negative for dysuria and urgency.  Musculoskeletal:  Negative for arthralgias and myalgias.  Allergic/Immunologic: Positive for environmental allergies.  Neurological:  Negative for dizziness and headaches.  Psychiatric/Behavioral:  Positive for sleep disturbance (insomnia). Negative for dysphoric mood.      Objective:  BP 110/78   Pulse 79   Temp (!) 96.9 F (36.1 C)   Ht 5' 6"  (1.676 m)   Wt 195 lb 3.2 oz (88.5 kg)   SpO2 99%   BMI 31.51 kg/m       10/25/2021   10:51 AM 10/25/2021   10:48 AM 03/16/2021    1:37 PM  BP/Weight  Systolic BP 960 454 098  Diastolic BP 84 80 82  Wt. (Lbs)  187 192  BMI  30.18 kg/m2 30.99 kg/m2    Physical Exam Constitutional:      Appearance: Normal appearance.  HENT:     Right Ear: Tympanic membrane normal.     Left Ear: Tympanic membrane normal.  Neck:     Vascular: No carotid bruit.  Cardiovascular:     Rate and Rhythm: Normal rate and regular rhythm.     Pulses: Normal pulses.     Heart sounds: Normal heart sounds. No murmur heard. Pulmonary:     Effort: Pulmonary effort is normal.     Breath sounds: Normal breath sounds.  Abdominal:     General: Bowel sounds are normal.     Palpations: Abdomen is soft. There is no mass.     Tenderness: There is no abdominal tenderness.  Musculoskeletal:         General: No swelling.  Skin:    Capillary Refill: Capillary refill takes less than 2 seconds.     Findings: Lesion present.          Comments: Left upper arm seborrheic keratosis, approximately 2 mm in diameter  Neurological:     General: No focal deficit present.     Mental Status: He is alert and oriented to person, place, and time.  Psychiatric:        Mood and Affect: Mood normal.        Behavior: Behavior normal.      Lab Results  Component Value Date   WBC 7.8 06/24/2018   HGB 11.7 (L) 06/24/2018   HCT 36.4 (L) 06/24/2018   PLT 508 (H) 06/24/2018   GLUCOSE 100 (H) 06/09/2019   TRIG 101.0 05/10/2017   ALT 32 06/24/2018   AST 34 06/24/2018   NA 142 06/09/2019   K 4.9 06/09/2019   CL 104 06/09/2019   CREATININE 1.23 06/09/2019   BUN 11 06/09/2019   CO2 26 06/09/2019   TSH 1.350 11/14/2016   INR 1.13 06/21/2018      Assessment & Plan:    1. Primary hypertension-well controlled - CBC with Differential/Platelet - Comprehensive metabolic panel - Lipid panel - valsartan (DIOVAN) 80 MG tablet; TAKE 1 TABLET(80 MG) BY MOUTH DAILY  Dispense: 15 tablet; Refill: 0 - carvedilol (COREG) 12.5 MG tablet; TAKE 1 TABLET(12.5 MG) BY MOUTH TWICE DAILY  Dispense: 180 tablet; Refill: 0  2. Gastroesophageal reflux disease-well controlled - omeprazole (PRILOSEC) 40 MG capsule; Take 1 capsule (40 mg total) by mouth daily.  Dispense: 30 capsule; Refill: 3  3. Family history of prostate cancer in father - PSA  4. Seasonal allergic rhinitis due to pollen-well controlled - fluticasone (FLONASE) 50 MCG/ACT nasal spray; Place 1 spray into both nostrils daily.  Dispense: 11.1 mL; Refill: 6  5. Muscle spasm - cyclobenzaprine (FLEXERIL) 5 MG tablet; Take 1 tablet (5 mg total) by mouth 3 (three) times daily as needed for muscle spasms.  Dispense: 30 tablet; Refill: 1  6. Celiac disease-well controlled - CBC with Differential/Platelet - Comprehensive metabolic panel -continue Gluten  free diet  7. BMI 31.0-31.9,adult - CBC with Differential/Platelet - Comprehensive metabolic panel - Lipid panel    We will call you with lab results Continue medications Follow-up with GI as scheduled Decrease caffeine in diet, practice sleep hygiene Follow-up in 62-month, fasting      Follow-up: 646-month fasting  An After Visit Summary was printed and given to the patient.  I, ShRip HarbourNP, have reviewed all documentation for this visit. The documentation on 02/15/22 for the exam, diagnosis, procedures, and orders are all accurate and complete.   Signed, ShRip HarbourNP [phone]

## 2022-02-15 ENCOUNTER — Encounter: Payer: Self-pay | Admitting: Nurse Practitioner

## 2022-02-15 ENCOUNTER — Ambulatory Visit: Payer: BC Managed Care – PPO | Admitting: Nurse Practitioner

## 2022-02-15 VITALS — BP 110/78 | HR 79 | Temp 96.9°F | Ht 66.0 in | Wt 195.2 lb

## 2022-02-15 DIAGNOSIS — Z8042 Family history of malignant neoplasm of prostate: Secondary | ICD-10-CM

## 2022-02-15 DIAGNOSIS — K219 Gastro-esophageal reflux disease without esophagitis: Secondary | ICD-10-CM | POA: Diagnosis not present

## 2022-02-15 DIAGNOSIS — M62838 Other muscle spasm: Secondary | ICD-10-CM

## 2022-02-15 DIAGNOSIS — Z6831 Body mass index (BMI) 31.0-31.9, adult: Secondary | ICD-10-CM

## 2022-02-15 DIAGNOSIS — J301 Allergic rhinitis due to pollen: Secondary | ICD-10-CM | POA: Diagnosis not present

## 2022-02-15 DIAGNOSIS — K900 Celiac disease: Secondary | ICD-10-CM

## 2022-02-15 DIAGNOSIS — I10 Essential (primary) hypertension: Secondary | ICD-10-CM

## 2022-02-15 MED ORDER — FLUTICASONE PROPIONATE 50 MCG/ACT NA SUSP: 1.0000 | Freq: Every day | NASAL | 6 refills | Status: DC

## 2022-02-15 MED ORDER — CYCLOBENZAPRINE HCL 5 MG PO TABS: 5.0000 mg | ORAL_TABLET | Freq: Three times a day (TID) | ORAL | 1 refills | Status: DC | PRN

## 2022-02-15 MED ORDER — OMEPRAZOLE 40 MG PO CPDR: 40.0000 mg | DELAYED_RELEASE_CAPSULE | Freq: Every day | ORAL | 3 refills | Status: DC

## 2022-02-15 MED ORDER — VALSARTAN 80 MG PO TABS: ORAL_TABLET | ORAL | 0 refills | Status: DC

## 2022-02-15 MED ORDER — CARVEDILOL 12.5 MG PO TABS: ORAL_TABLET | ORAL | 0 refills | Status: DC

## 2022-02-15 NOTE — Patient Instructions (Addendum)
We will call you with lab results Continue medications Follow-up with GI as scheduled Decrease caffeine in diet, practice sleep hygiene Follow-up in 59-month, fasting   Quality Sleep Information, Adult Quality sleep is important for your mental and physical health. It also improves your quality of life. Quality sleep means you: Are asleep for most of the time you are in bed. Fall asleep within 30 minutes. Wake up no more than once a night.  Are awake for no longer than 20 minutes if you do wake up during the night. Most adults need 7-8 hours of quality sleep each night. How can poor sleep affect me? If you do not get enough quality sleep, you may have: Mood swings. Daytime sleepiness. Confusion. Decreased reaction time. Sleep disorders, such as insomnia and sleep apnea. Difficulty with: Solving problems. Coping with stress. Paying attention. These issues may affect your performance and productivity at work, school, and at home. Lack of sleep may also put you at higher risk for accidents, suicide, and risky behaviors. If you do not get quality sleep you may also be at higher risk for several health problems, including: Infections. Type 2 diabetes. Heart disease. High blood pressure. Obesity. Worsening of long-term conditions, like arthritis, kidney disease, depression, Parkinson's disease, and epilepsy. What actions can I take to get more quality sleep?     Stick to a sleep schedule. Go to sleep and wake up at about the same time each day. Do not try to sleep less on weekdays and make up for lost sleep on weekends. This does not work. Try to get about 30 minutes of exercise on most days. Do not exercise 2-3 hours before going to bed. Limit naps during the day to 30 minutes or less. Do not use any products that contain nicotine or tobacco, such as cigarettes or e-cigarettes. If you need help quitting, ask your health care provider. Do not drink caffeinated beverages for at  least 8 hours before going to bed. Coffee, tea, and some sodas contain caffeine. Do not drink alcohol close to bedtime. Do not eat large meals close to bedtime. Do not take naps in the late afternoon. Try to get at least 30 minutes of sunlight every day. Morning sunlight is best. Make time to relax before bed. Reading, listening to music, or taking a hot bath promotes quality sleep. Make your bedroom a place that promotes quality sleep. Keep your bedroom dark, quiet, and at a comfortable room temperature. Make sure your bed is comfortable. Take out sleep distractions like TV, a computer, smartphone, and bright lights. If you are lying awake in bed for longer than 20 minutes, get up and do a relaxing activity until you feel sleepy. Work with your health care provider to treat medical conditions that may affect sleeping, such as: Nasal obstruction. Snoring. Sleep apnea and other sleep disorders. Talk to your health care provider if you think any of your prescription medicines may cause you to have difficulty falling or staying asleep. If you have sleep problems, talk with a sleep consultant. If you think you have a sleep disorder, talk with your health care provider about getting evaluated by a specialist. Where to find more information [place]: https://sleepfoundation.org National Heart, Lung, and [place]: whttp://www.saunders.info/pdf Centers for Disease Control and Prevention (CDC): wLearningDermatology.plContact a health care provider if you: Have trouble getting to sleep or staying asleep. Often wake up very early in the morning and cannot get back to sleep. Have daytime sleepiness.  Have daytime sleep attacks of suddenly falling asleep and sudden muscle weakness (narcolepsy). Have a tingling sensation in your legs with a strong urge to move your legs (restless legs syndrome). Stop breathing briefly during sleep  (sleep apnea). Think you have a sleep disorder or are taking a medicine that is affecting your quality of sleep. Summary Most adults need 7-8 hours of quality sleep each night. Getting enough quality sleep is an important part of health and well-being. Make your bedroom a place that promotes quality sleep and avoid things that may cause you to have poor sleep, such as alcohol, caffeine, smoking, and large meals. Talk to your health care provider if you have trouble falling asleep or staying asleep. This information is not intended to replace advice given to you by your health care provider. Make sure you discuss any questions you have with your health care provider. Document Revised: 09/05/2021 Document Reviewed: 05/30/2021 Elsevier Patient Education  [place].  Insomnia Insomnia is a sleep disorder that makes it difficult to fall asleep or stay asleep. Insomnia can cause fatigue, low energy, difficulty concentrating, mood swings, and poor performance at work or school. There are three different ways to classify insomnia: Difficulty falling asleep. Difficulty staying asleep. Waking up too early in the morning. Any type of insomnia can be long-term (chronic) or short-term (acute). Both are common. Short-term insomnia usually lasts for 3 months or less. Chronic insomnia occurs at least three times a week for longer than 3 months. What are the causes? Insomnia may be caused by another condition, situation, or substance, such as: Having certain mental health conditions, such as anxiety and depression. Using caffeine, alcohol, tobacco, or drugs. Having gastrointestinal conditions, such as gastroesophageal reflux disease (GERD). Having certain medical conditions. These include: Asthma. Alzheimer's disease. Stroke. Chronic pain. An overactive thyroid gland (hyperthyroidism). Other sleep disorders, such as restless legs syndrome and sleep apnea. Menopause. Sometimes, the cause of  insomnia may not be known. What increases the risk? Risk factors for insomnia include: Gender. Females are affected more often than males. Age. Insomnia is more common as people get older. Stress and certain medical and mental health conditions. Lack of exercise. Having an irregular work schedule. This may include working night shifts and traveling between different time zones. What are the signs or symptoms? If you have insomnia, the main symptom is having trouble falling asleep or having trouble staying asleep. This may lead to other symptoms, such as: Feeling tired or having low energy. Feeling nervous about going to sleep. Not feeling rested in the morning. Having trouble concentrating. Feeling irritable, anxious, or depressed. How is this diagnosed? This condition may be diagnosed based on: Your symptoms and medical history. Your health care provider may ask about: Your sleep habits. Any medical conditions you have. Your mental health. A physical exam. How is this treated? Treatment for insomnia depends on the cause. Treatment may focus on treating an underlying condition that is causing the insomnia. Treatment may also include: Medicines to help you sleep. Counseling or therapy. Lifestyle adjustments to help you sleep better. Follow these instructions at home: Eating and drinking  Limit or avoid alcohol, caffeinated beverages, and products that contain nicotine and tobacco, especially close to bedtime. These can disrupt your sleep. Do not eat a large meal or eat spicy foods right before bedtime. This can lead to digestive discomfort that can make it hard for you to sleep. Sleep habits  Keep a sleep diary to help you and your health  care provider figure out what could be causing your insomnia. Write down: When you sleep. When you wake up during the night. How well you sleep and how rested you feel the next day. Any side effects of medicines you are taking. What you eat and  drink. Make your bedroom a dark, comfortable place where it is easy to fall asleep. Put up shades or blackout curtains to block light from outside. Use a white noise machine to block noise. Keep the temperature cool. Limit screen use before bedtime. This includes: Not watching TV. Not using your smartphone, tablet, or computer. Stick to a routine that includes going to bed and waking up at the same times every day and night. This can help you fall asleep faster. Consider making a quiet activity, such as reading, part of your nighttime routine. Try to avoid taking naps during the day so that you sleep better at night. Get out of bed if you are still awake after 15 minutes of trying to sleep. Keep the lights down, but try reading or doing a quiet activity. When you feel sleepy, go back to bed. General instructions Take over-the-counter and prescription medicines only as told by your health care provider. Exercise regularly as told by your health care provider. However, avoid exercising in the hours right before bedtime. Use relaxation techniques to manage stress. Ask your health care provider to suggest some techniques that may work well for you. These may include: Breathing exercises. Routines to release muscle tension. Visualizing peaceful scenes. Make sure that you drive carefully. Do not drive if you feel very sleepy. Keep all follow-up visits. This is important. Contact a health care provider if: You are tired throughout the day. You have trouble in your daily routine due to sleepiness. You continue to have sleep problems, or your sleep problems get worse. Get help right away if: You have thoughts about hurting yourself or someone else. Get help right away if you feel like you may hurt yourself or others, or have thoughts about taking your own life. Go to your nearest emergency room or: Call 911. Call the [facility] at (531)348-1808 or 988. This is open 24  hours a day. Text the Crisis Text Line at 765-652-8666. Summary Insomnia is a sleep disorder that makes it difficult to fall asleep or stay asleep. Insomnia can be long-term (chronic) or short-term (acute). Treatment for insomnia depends on the cause. Treatment may focus on treating an underlying condition that is causing the insomnia. Keep a sleep diary to help you and your health care provider figure out what could be causing your insomnia. This information is not intended to replace advice given to you by your health care provider. Make sure you discuss any questions you have with your health care provider. Document Revised: 07/11/2021 Document Reviewed: 07/11/2021 Elsevier Patient Education  [place].

## 2022-02-16 LAB — COMPREHENSIVE METABOLIC PANEL
ALT: 22 IU/L (ref 0–44)
AST: 18 IU/L (ref 0–40)
Albumin/Globulin Ratio: 2.9 — ABNORMAL HIGH (ref 1.2–2.2)
Albumin: 4.9 g/dL (ref 3.8–4.9)
Alkaline Phosphatase: 64 IU/L (ref 44–121)
BUN/Creatinine Ratio: 12 (ref 9–20)
BUN: 16 mg/dL (ref 6–24)
Bilirubin Total: 0.8 mg/dL (ref 0.0–1.2)
CO2: 22 mmol/L (ref 20–29)
Calcium: 10 mg/dL (ref 8.7–10.2)
Chloride: 103 mmol/L (ref 96–106)
Creatinine, Ser: 1.29 mg/dL — ABNORMAL HIGH (ref 0.76–1.27)
Globulin, Total: 1.7 g/dL (ref 1.5–4.5)
Glucose: 150 mg/dL — ABNORMAL HIGH (ref 70–99)
Potassium: 5.1 mmol/L (ref 3.5–5.2)
Sodium: 139 mmol/L (ref 134–144)
Total Protein: 6.6 g/dL (ref 6.0–8.5)
eGFR: 65 mL/min/{1.73_m2} (ref >59)

## 2022-02-16 LAB — CBC WITH DIFFERENTIAL/PLATELET
Basophils Absolute: 0.1 10*3/uL (ref 0.0–0.2)
Basos: 1 %
EOS (ABSOLUTE): 0.3 10*3/uL (ref 0.0–0.4)
Eos: 4 %
Hematocrit: 44.1 % (ref 37.5–51.0)
Hemoglobin: 14.6 g/dL (ref 13.0–17.7)
Immature Grans (Abs): 0 10*3/uL (ref 0.0–0.1)
Immature Granulocytes: 0 %
Lymphocytes Absolute: 1.1 10*3/uL (ref 0.7–3.1)
Lymphs: 16 %
MCH: 30 pg (ref 26.6–33.0)
MCHC: 33.1 g/dL (ref 31.5–35.7)
MCV: 91 fL (ref 79–97)
Monocytes Absolute: 0.6 10*3/uL (ref 0.1–0.9)
Monocytes: 9 %
Neutrophils Absolute: 4.7 10*3/uL (ref 1.4–7.0)
Neutrophils: 70 %
Platelets: 295 10*3/uL (ref 150–450)
RBC: 4.86 x10E6/uL (ref 4.14–5.80)
RDW: 11.9 % (ref 11.6–15.4)
WBC: 6.7 10*3/uL (ref 3.4–10.8)

## 2022-02-16 LAB — LIPID PANEL
Chol/HDL Ratio: 4.2 ratio (ref 0.0–5.0)
Cholesterol, Total: 190 mg/dL (ref 100–199)
HDL: 45 mg/dL (ref >39)
LDL Chol Calc (NIH): 116 mg/dL — ABNORMAL HIGH (ref 0–99)
Triglycerides: 164 mg/dL — ABNORMAL HIGH (ref 0–149)
VLDL Cholesterol Cal: 29 mg/dL (ref 5–40)

## 2022-02-16 LAB — CARDIOVASCULAR RISK ASSESSMENT

## 2022-02-16 LAB — PSA: Prostate Specific Ag, Serum: 1.2 ng/mL (ref 0.0–4.0)

## 2022-02-20 LAB — HGB A1C W/O EAG: Hgb A1c MFr Bld: 6 % — ABNORMAL HIGH (ref 4.8–5.6)

## 2022-02-20 LAB — SPECIMEN STATUS REPORT

## 2022-02-22 ENCOUNTER — Other Ambulatory Visit: Payer: Self-pay

## 2022-02-22 ENCOUNTER — Telehealth: Payer: Self-pay

## 2022-02-22 DIAGNOSIS — I10 Essential (primary) hypertension: Secondary | ICD-10-CM

## 2022-02-22 MED ORDER — VALSARTAN 80 MG PO TABS: ORAL_TABLET | ORAL | 2 refills | Status: DC

## 2022-02-22 MED ORDER — METFORMIN HCL 500 MG PO TABS: 500.0000 mg | ORAL_TABLET | Freq: Two times a day (BID) | ORAL | 0 refills | Status: DC

## 2022-02-22 NOTE — Telephone Encounter (Signed)
error 

## 2022-04-23 ENCOUNTER — Other Ambulatory Visit: Payer: Self-pay | Admitting: Legal Medicine

## 2022-04-23 DIAGNOSIS — K219 Gastro-esophageal reflux disease without esophagitis: Secondary | ICD-10-CM

## 2022-05-22 ENCOUNTER — Encounter: Payer: Self-pay | Admitting: Nurse Practitioner

## 2022-05-22 ENCOUNTER — Ambulatory Visit: Payer: BC Managed Care – PPO | Admitting: Nurse Practitioner

## 2022-05-22 VITALS — BP 124/84 | HR 82 | Temp 97.4°F | Ht 66.0 in | Wt 195.0 lb

## 2022-05-22 DIAGNOSIS — K900 Celiac disease: Secondary | ICD-10-CM

## 2022-05-22 DIAGNOSIS — Z23 Encounter for immunization: Secondary | ICD-10-CM

## 2022-05-22 DIAGNOSIS — Z6831 Body mass index (BMI) 31.0-31.9, adult: Secondary | ICD-10-CM

## 2022-05-22 DIAGNOSIS — I10 Essential (primary) hypertension: Secondary | ICD-10-CM | POA: Diagnosis not present

## 2022-05-22 DIAGNOSIS — K219 Gastro-esophageal reflux disease without esophagitis: Secondary | ICD-10-CM | POA: Diagnosis not present

## 2022-05-22 DIAGNOSIS — R7303 Prediabetes: Secondary | ICD-10-CM | POA: Diagnosis not present

## 2022-05-22 NOTE — Patient Instructions (Addendum)
Continue medications We will call you with lab results Follow-up in 71-month   Prediabetes Prediabetes is when your blood sugar (blood glucose) level is higher than normal but not high enough for you to be diagnosed with type 2 diabetes. Having prediabetes puts you at risk for developing type 2 diabetes (type 2 diabetes mellitus). With certain lifestyle changes, you may be able to prevent or delay the onset of type 2 diabetes. This is important because type 2 diabetes can lead to serious complications, such as: Heart disease. Stroke. Blindness. Kidney disease. Depression. Poor circulation in the feet and legs. In severe cases, this could lead to surgical removal of a leg (amputation). What are the causes? The exact cause of prediabetes is not known. It may result from insulin resistance. Insulin resistance develops when cells in the body do not respond properly to insulin that the body makes. This can cause excess glucose to build up in the blood. High blood glucose (hyperglycemia) can develop. What increases the risk? The following factors may make you more likely to develop this condition: You have a family member with type 2 diabetes. You are older than 45 years. You had a temporary form of diabetes during a pregnancy (gestational diabetes). You had polycystic ovary syndrome (PCOS). You are overweight or obese. You are inactive (sedentary). You have a history of heart disease, including problems with cholesterol levels, high levels of blood fats, or high blood pressure. What are the signs or symptoms? You may have no symptoms. If you do have symptoms, they may include: Increased hunger. Increased thirst. Increased urination. Vision changes, such as blurry vision. Tiredness (fatigue). How is this diagnosed? This condition can be diagnosed with blood tests. Your blood glucose may be checked with one or more of the following tests: A fasting blood glucose (FBG) test. You will not be  allowed to eat (you will fast) for at least 8 hours before a blood sample is taken. An A1C blood test (hemoglobin A1C). This test provides information about blood glucose levels over the previous 2?3 months. An oral glucose tolerance test (OGTT). This test measures your blood glucose at two points in time: After fasting. This is your baseline level. Two hours after you drink a beverage that contains glucose. You may be diagnosed with prediabetes if: Your FBG is 100?125 mg/dL (5.6-6.9 mmol/L). Your A1C level is 5.7?6.4% (39-46 mmol/mol). Your OGTT result is 140?199 mg/dL (7.8-11 mmol/L). These blood tests may be repeated to confirm your diagnosis. How is this treated? Treatment may include dietary and lifestyle changes to help lower your blood glucose and prevent type 2 diabetes from developing. In some cases, medicine may be prescribed to help lower the risk of type 2 diabetes. Follow these instructions at home: Nutrition  Follow a healthy meal plan. This includes eating lean proteins, whole grains, legumes, fresh fruits and vegetables, low-fat dairy products, and healthy fats. Follow instructions from your health care provider about eating or drinking restrictions. Meet with a dietitian to create a healthy eating plan that is right for you. Lifestyle Do moderate-intensity exercise for at least 30 minutes a day on 5 or more days each week, or as told by your health care provider. A mix of activities may be best, such as: Brisk walking, swimming, biking, and weight lifting. Lose weight as told by your health care provider. Losing 5-7% of your body weight can reverse insulin resistance. Do not drink alcohol if: Your health care provider tells you not to drink. You are  pregnant, may be pregnant, or are planning to become pregnant. If you drink alcohol: Limit how much you use to: 0-1 drink a day for women. 0-2 drinks a day for men. Be aware of how much alcohol is in your drink. In the [place]  one drink equals one 12 oz bottle of beer (355 mL), one 5 oz glass of wine (148 mL), or one 1 oz glass of hard liquor (44 mL). General instructions Take over-the-counter and prescription medicines only as told by your health care provider. You may be prescribed medicines that help lower the risk of type 2 diabetes. Do not use any products that contain nicotine or tobacco, such as cigarettes, e-cigarettes, and chewing tobacco. If you need help quitting, ask your health care provider. Keep all follow-up visits. This is important. Where to find more information American Diabetes Association: www.diabetes.org Academy of Nutrition and Dietetics: www.eatright.org American Heart Association: www.heart.org Contact a health care provider if: You have any of these symptoms: Increased hunger. Increased urination. Increased thirst. Fatigue. Vision changes, such as blurry vision. Get help right away if you: Have shortness of breath. Feel confused. Vomit or feel like you may vomit. Summary Prediabetes is when your blood sugar (blood glucose)level is higher than normal but not high enough for you to be diagnosed with type 2 diabetes. Having prediabetes puts you at risk for developing type 2 diabetes (type 2 diabetes mellitus). Make lifestyle changes such as eating a healthy diet and exercising regularly to help prevent diabetes. Lose weight as told by your health care provider. This information is not intended to replace advice given to you by your health care provider. Make sure you discuss any questions you have with your health care provider. Document Revised: 10/30/2019 Document Reviewed: 10/30/2019 Elsevier Patient Education  [place].   Prediabetes Eating Plan Prediabetes is a condition that causes blood sugar (glucose) levels to be higher than normal. This increases the risk for developing type 2 diabetes (type 2 diabetes mellitus). Working with a health care provider or nutrition  specialist (dietitian) to make diet and lifestyle changes can help prevent the onset of diabetes. These changes may help you: Control your blood glucose levels. Improve your cholesterol levels. Manage your blood pressure. What are tips for following this plan? Reading food labels Read food labels to check the amount of fat, salt (sodium), and sugar in prepackaged foods. Avoid foods that have: Saturated fats. Trans fats. Added sugars. Avoid foods that have more than 300 milligrams (mg) of sodium per serving. Limit your sodium intake to less than 2,300 mg each day. Shopping Avoid buying pre-made and processed foods. Avoid buying drinks with added sugar. Cooking Cook with olive oil. Do not use butter, lard, or ghee. Bake, broil, grill, steam, or boil foods. Avoid frying. Meal planning  Work with your dietitian to create an eating plan that is right for you. This may include tracking how many calories you take in each day. Use a food diary, notebook, or mobile application to track what you eat at each meal. Consider following a Mediterranean diet. This includes: Eating several servings of fresh fruits and vegetables each day. Eating fish at least twice a week. Eating one serving each day of whole grains, beans, nuts, and seeds. Using olive oil instead of other fats. Limiting alcohol. Limiting red meat. Using nonfat or low-fat dairy products. Consider following a plant-based diet. This includes dietary choices that focus on eating mostly vegetables and fruit, grains, beans, nuts, and seeds. If  you have high blood pressure, you may need to limit your sodium intake or follow a diet such as the DASH (Dietary Approaches to Stop Hypertension) eating plan. The DASH diet aims to lower high blood pressure. Lifestyle Set weight loss goals with help from your health care team. It is recommended that most people with prediabetes lose 7% of their body weight. Exercise for at least 30 minutes 5 or more  days a week. Attend a support group or seek support from a mental health counselor. Take over-the-counter and prescription medicines only as told by your health care provider. What foods are recommended? Fruits Berries. Bananas. Apples. Oranges. Grapes. Papaya. Mango. Pomegranate. Kiwi. Grapefruit. Cherries. Vegetables Lettuce. Spinach. Peas. Beets. Cauliflower. Cabbage. Broccoli. Carrots. Tomatoes. Squash. Eggplant. Herbs. Peppers. Onions. Cucumbers. Brussels sprouts. Grains Whole grains, such as whole-wheat or whole-grain breads, crackers, cereals, and pasta. Unsweetened oatmeal. Bulgur. Barley. Quinoa. Brown rice. Corn or whole-wheat flour tortillas or taco shells. Meats and other proteins Seafood. Poultry without skin. Lean cuts of pork and beef. Tofu. Eggs. Nuts. Beans. Dairy Low-fat or fat-free dairy products, such as yogurt, cottage cheese, and cheese. Beverages Water. Tea. Coffee. Sugar-free or diet soda. Seltzer water. Low-fat or nonfat milk. Milk alternatives, such as soy or almond milk. Fats and oils Olive oil. Canola oil. Sunflower oil. Grapeseed oil. Avocado. Walnuts. Sweets and desserts Sugar-free or low-fat pudding. Sugar-free or low-fat ice cream and other frozen treats. Seasonings and condiments Herbs. Sodium-free spices. Mustard. Relish. Low-salt, low-sugar ketchup. Low-salt, low-sugar barbecue sauce. Low-fat or fat-free mayonnaise. The items listed above may not be a complete list of recommended foods and beverages. Contact a dietitian for more information. What foods are not recommended? Fruits Fruits canned with syrup. Vegetables Canned vegetables. Frozen vegetables with butter or cream sauce. Grains Refined white flour and flour products, such as bread, pasta, snack foods, and cereals. Meats and other proteins Fatty cuts of meat. Poultry with skin. Breaded or fried meat. Processed meats. Dairy Full-fat yogurt, cheese, or milk. Beverages Sweetened drinks, such  as iced tea and soda. Fats and oils Butter. Lard. Ghee. Sweets and desserts Baked goods, such as cake, cupcakes, pastries, cookies, and cheesecake. Seasonings and condiments Spice mixes with added salt. Ketchup. Barbecue sauce. Mayonnaise. The items listed above may not be a complete list of foods and beverages that are not recommended. Contact a dietitian for more information. Where to find more information American Diabetes Association: www.diabetes.org Summary You may need to make diet and lifestyle changes to help prevent the onset of diabetes. These changes can help you control blood sugar, improve cholesterol levels, and manage blood pressure. Set weight loss goals with help from your health care team. It is recommended that most people with prediabetes lose 7% of their body weight. Consider following a Mediterranean diet. This includes eating plenty of fresh fruits and vegetables, whole grains, beans, nuts, seeds, fish, and low-fat dairy, and using olive oil instead of other fats. This information is not intended to replace advice given to you by your health care provider. Make sure you discuss any questions you have with your health care provider. Document Revised: 10/30/2019 Document Reviewed: 10/30/2019 Elsevier Patient Education  [place].

## 2022-05-22 NOTE — Progress Notes (Signed)
Subjective:  Patient ID: Gary Weaver, male    DOB: 1965/11/05  Age: 56 y.o. MRN: [id number]  Chief Complaint  Patient presents with   Gastroesophageal Reflux   Hypertension   Prediabetes    HPI   Pt presents for follow-up of chronic medical problems of HTN, Prediabetes, and GERD. He is followed by [facility] for celiac disease.  He is followed by Dr Harriet Masson, cardiology for HTN and SVT. Reports chronic insomnia. He is a Occupational hygienist. Helps care for his aging parents.     Hypertension, follow-up: He was last seen for hypertension 3 months ago.  BP at that visit was 110/78. Management includes coreg and valsartan.  He reports excellent compliance with treatment. He is not having side effects.  He is following a  gluten free  diet. He is not exercising. He does not smoke.  Use of agents associated with hypertension: none.   Pertinent labs: Lab Results  Component Value Date   CHOL 190 02/15/2022   HDL 45 02/15/2022   LDLCALC 116 (H) 02/15/2022   TRIG 164 (H) 02/15/2022   CHOLHDL 4.2 02/15/2022   Lab Results  Component Value Date   NA 139 02/15/2022   K 5.1 02/15/2022   CREATININE 1.29 (H) 02/15/2022   EGFR 65 02/15/2022   GFRNONAA 67 06/09/2019   GLUCOSE 150 (H) 02/15/2022      GERD, Follow up:  The patient was last seen for GERD 3 months ago. Current treatment consist of: Prilosec and Reglan He reports excellent compliance with treatment. He is not having side effects. Marland Kitchen  He is NOT experiencing abdominal bloating or belching  Prediabetes, Follow-up  Lab Results  Component Value Date   HGBA1C 6.0 (H) 02/15/2022   GLUCOSE 150 (H) 02/15/2022   GLUCOSE 100 (H) 06/09/2019   GLUCOSE 164 (H) 06/24/2018    Last seen for for this3 months ago.  Management since that visit includes metformin 500 mg daily (cuts pill in half and takes 250 mg BID) states he just starting taking metformin 1 week ago. Current symptoms include none and have been  stable.  Pertinent Labs:    Component Value Date/Time   CHOL 190 02/15/2022 1128   TRIG 164 (H) 02/15/2022 1128   CHOLHDL 4.2 02/15/2022 1128   CREATININE 1.29 (H) 02/15/2022 1128    Wt Readings from Last 3 Encounters:  05/22/22 195 lb (88.5 kg)  02/15/22 195 lb 3.2 oz (88.5 kg)  10/25/21 187 lb (84.8 kg)    Current Outpatient Medications on File Prior to Visit  Medication Sig Dispense Refill   albuterol (PROVENTIL HFA;VENTOLIN HFA) 108 (90 Base) MCG/ACT inhaler Inhale 2 puffs into the lungs every 6 (six) hours as needed for wheezing or shortness of breath.     carvedilol (COREG) 12.5 MG tablet TAKE 1 TABLET(12.5 MG) BY MOUTH TWICE DAILY 180 tablet 0   Cholecalciferol (VITAMIN D3) 50 MCG (2000 UT) TABS Take 1 tablet by mouth daily.     cyclobenzaprine (FLEXERIL) 5 MG tablet Take 1 tablet (5 mg total) by mouth 3 (three) times daily as needed for muscle spasms. 30 tablet 1   fexofenadine (ALLEGRA) 180 MG tablet Take 180 mg by mouth daily.      fluticasone (FLONASE) 50 MCG/ACT nasal spray Place 1 spray into both nostrils daily. 11.1 mL 6   gabapentin (NEURONTIN) 300 MG capsule TAKE ONE CAPSULE BY MOUTH IN THE MORNING AND 2 CAPSULES IN THE EVENING 90 capsule 0   metFORMIN (GLUCOPHAGE) 500  MG tablet Take 1 tablet (500 mg total) by mouth 2 (two) times daily with a meal. 180 tablet 0   metoCLOPramide (REGLAN) 5 MG tablet TAKE WEEKLY AS NEEDED FOR ACID REFLUX 30 tablet 3   omeprazole (PRILOSEC) 40 MG capsule TAKE 1 CAPSULE BY MOUTH DAILY 90 capsule 0   valsartan (DIOVAN) 80 MG tablet TAKE 1 TABLET(80 MG) BY MOUTH DAILY 30 tablet 2   No current facility-administered medications on file prior to visit.   Past Medical History:  Diagnosis Date   Anxiety    Arrhythmia    Arthritis    Asthma    Bile leak    Celiac disease    GERD (gastroesophageal reflux disease)    Gluten intolerance    Headache    Hearing loss    Hiatal hernia    Hx of acute pancreatitis 05/09/2017   Hyperlipidemia     Hypertension    Intra-abdominal fluid collection 06/20/2018   Memory loss    Pancreatitis    Pneumonia    hx of    Vision abnormalities    Past Surgical History:  Procedure Laterality Date   BILIARY STENT PLACEMENT  06/23/2018   Procedure: BILIARY STENT PLACEMENT;  Surgeon: Ronnette Juniper, MD;  Location: [facility] ENDOSCOPY;  Service: Gastroenterology;;   COLONOSCOPY     ERCP N/A 06/23/2018   Procedure: ENDOSCOPIC RETROGRADE CHOLANGIOPANCREATOGRAPHY (ERCP);  Surgeon: Ronnette Juniper, MD;  Location: [facility];  Service: Gastroenterology;  Laterality: N/A;   ESOPHAGOGASTRODUODENOSCOPY (EGD) WITH PROPOFOL N/A 07/23/2018   Procedure: ESOPHAGOGASTRODUODENOSCOPY (EGD) WITH PROPOFOL;  Surgeon: Doran Stabler, MD;  Location: [facility] ENDOSCOPY;  Service: Gastroenterology;  Laterality: N/A;   LAPAROSCOPIC CHOLECYSTECTOMY SINGLE SITE WITH INTRAOPERATIVE CHOLANGIOGRAM N/A 06/13/2018   Procedure: LAPAROSCOPIC CHOLECYSTECTOMY SINGLE SITE WITH INTRAOPERATIVE CHOLANGIOGRAM ERAS PATHWAY;  Surgeon: Michael Boston, MD;  Location: [facility] ORS;  Service: General;  Laterality: N/A;   SPHINCTEROTOMY  06/23/2018   Procedure: SPHINCTEROTOMY;  Surgeon: Ronnette Juniper, MD;  Location: [facility] ENDOSCOPY;  Service: Gastroenterology;;   STENT REMOVAL  07/23/2018   Procedure: STENT REMOVAL;  Surgeon: Doran Stabler, MD;  Location: [facility] ENDOSCOPY;  Service: Gastroenterology;;   UMBILICAL HERNIA REPAIR N/A 06/13/2018   Procedure: LAPAROSCOPIC POSSIBLE OPEN UMBILICAL HERNIA;  Surgeon: Michael Boston, MD;  Location: [facility] ORS;  Service: General;  Laterality: N/A;   UPPER GASTROINTESTINAL ENDOSCOPY     WISDOM TOOTH EXTRACTION      Family History  Problem Relation Age of Onset   Hypertension Father    Prostate cancer Father    Colon polyps Father    Bladder Cancer Mother    Colon cancer Maternal Grandmother    Esophageal cancer Neg Hx    Rectal cancer Neg Hx    Stomach cancer Neg Hx    Pancreatic cancer Neg Hx    Social History    Socioeconomic History   Marital status: Married    Spouse name: Not on file   Number of children: 3   Years of education: Not on file   Highest education level: Not on file  Occupational History   Occupation: Chief Strategy Officer at [employer]  Tobacco Use   Smoking status: Never   Smokeless tobacco: Current    Types: Chew   Tobacco comments:    ocasionally  Vaping Use   Vaping Use: Never used  Substance and Sexual Activity   Alcohol use: Yes    Alcohol/week: 2.0 standard drinks of alcohol    Types: 2 Standard drinks or equivalent per week  Comment: ocassionally   Drug use: No   Sexual activity: Yes    Partners: Female  Other Topics Concern   Not on file  Social History Narrative   Not on file   Social Determinants of Health   Financial Resource Strain: Low Risk  (05/22/2022)   Overall Financial Resource Strain (CARDIA)    Difficulty of Paying Living Expenses: Not hard at all  Food Insecurity: No Food Insecurity (05/22/2022)   Hunger Vital Sign    Worried About Running Out of Food in the Last Year: Never true    Ran Out of Food in the Last Year: Never true  Transportation Needs: No Transportation Needs (05/22/2022)   PRAPARE - Hydrologist (Medical): No    Lack of Transportation (Non-Medical): No  Physical Activity: Inactive (05/22/2022)   Exercise Vital Sign    Days of Exercise per Week: 0 days    Minutes of Exercise per Session: 0 min  Stress: No Stress Concern Present (05/22/2022)   [place]    Feeling of Stress : Not at all  Social Connections: Moderately Integrated (05/22/2022)   Social Connection and Isolation Panel [NHANES]    Frequency of Communication with Friends and Family: Three times a week    Frequency of Social Gatherings with Friends and Family: Three times a week    Attends Religious Services: More than 4 times per year    Active Member of Clubs or  Organizations: No    Attends Archivist Meetings: Never    Marital Status: Married    Review of Systems  Constitutional:  Negative for chills, diaphoresis, fatigue and fever.  HENT:  Negative for congestion, ear pain and sore throat.   Respiratory:  Negative for cough and shortness of breath.   Cardiovascular:  Negative for chest pain and leg swelling.  Gastrointestinal:  Negative for abdominal pain, constipation, diarrhea, nausea and vomiting.  Genitourinary:  Negative for dysuria and urgency.  Musculoskeletal:  Negative for arthralgias and myalgias.  Neurological:  Negative for dizziness and headaches.  Psychiatric/Behavioral:  Negative for dysphoric mood.      Objective:  BP 124/84   Pulse 82   Temp (!) 97.4 F (36.3 C)   Ht 5' 6"  (1.676 m)   Wt 195 lb (88.5 kg)   SpO2 98%   BMI 31.47 kg/m       05/22/2022    1:39 PM 02/15/2022   10:56 AM 10/25/2021   10:51 AM  BP/Weight  Systolic BP  854 627  Diastolic BP  78 84  Wt. (Lbs) 195 195.2   BMI 31.47 kg/m2 31.51 kg/m2     Physical Exam Vitals reviewed.  Constitutional:      Appearance: Normal appearance.  HENT:     Right Ear: Tympanic membrane normal.     Left Ear: Tympanic membrane normal.     Nose: Nose normal.     Mouth/Throat:     Mouth: Mucous membranes are moist.  Cardiovascular:     Rate and Rhythm: Normal rate and regular rhythm.     Pulses: Normal pulses.     Heart sounds: Normal heart sounds.  Pulmonary:     Effort: Pulmonary effort is normal.     Breath sounds: Normal breath sounds.  Abdominal:     General: Bowel sounds are normal.     Palpations: Abdomen is soft.  Skin:    General: Skin is warm and dry.  Capillary Refill: Capillary refill takes less than 2 seconds.  Neurological:     General: No focal deficit present.     Mental Status: He is alert and oriented to person, place, and time.  Psychiatric:        Mood and Affect: Mood normal.        Behavior: Behavior normal.     Lab Results  Component Value Date   WBC 6.7 02/15/2022   HGB 14.6 02/15/2022   HCT 44.1 02/15/2022   PLT 295 02/15/2022   GLUCOSE 150 (H) 02/15/2022   CHOL 190 02/15/2022   TRIG 164 (H) 02/15/2022   HDL 45 02/15/2022   LDLCALC 116 (H) 02/15/2022   ALT 22 02/15/2022   AST 18 02/15/2022   NA 139 02/15/2022   K 5.1 02/15/2022   CL 103 02/15/2022   CREATININE 1.29 (H) 02/15/2022   BUN 16 02/15/2022   CO2 22 02/15/2022   TSH 1.350 11/14/2016   INR 1.13 06/21/2018   HGBA1C 6.0 (H) 02/15/2022      Assessment & Plan:   1. Primary hypertension-well controlled - CBC with Differential/Platelet - Comprehensive metabolic panel -continue Diovan 80 mg QD and Coreg 12.5 mg BID  2. Gastroesophageal reflux disease, unspecified whether esophagitis present-well controlled - CBC with Differential/Platelet - Comprehensive metabolic panel  3. Prediabetes-not at goal - Hemoglobin A1c - Lipid panel -continue Metformin 500 mg BID -heart healthy, low carb diet  4. BMI 31.0-31.9,adult - CBC with Differential/Platelet - Comprehensive metabolic panel - Hemoglobin A1c - Lipid panel  5. Celiac disease-well controlled - CBC with Differential/Platelet - Comprehensive metabolic panel -continue gluten free diet as directed per GI  6. Encounter for immunization - Flu Vaccine MDCK QUAD PF     Continue medications We will call you with lab results Follow-up in 97-month  Follow-up: 632-month fasting  An After Visit Summary was printed and given to the patient.  I, ShRip HarbourNP, have reviewed all documentation for this visit. The documentation on 05/22/22 for the exam, diagnosis, procedures, and orders are all accurate and complete.    Signed, ShRip HarbourNP [phone]

## 2022-05-23 LAB — COMPREHENSIVE METABOLIC PANEL
ALT: 19 IU/L (ref 0–44)
AST: 20 IU/L (ref 0–40)
Albumin/Globulin Ratio: 2.5 — ABNORMAL HIGH (ref 1.2–2.2)
Albumin: 5 g/dL — ABNORMAL HIGH (ref 3.8–4.9)
Alkaline Phosphatase: 54 IU/L (ref 44–121)
BUN/Creatinine Ratio: 10 (ref 9–20)
BUN: 12 mg/dL (ref 6–24)
Bilirubin Total: 1.1 mg/dL (ref 0.0–1.2)
CO2: 22 mmol/L (ref 20–29)
Calcium: 10 mg/dL (ref 8.7–10.2)
Chloride: 104 mmol/L (ref 96–106)
Creatinine, Ser: 1.19 mg/dL (ref 0.76–1.27)
Globulin, Total: 2 g/dL (ref 1.5–4.5)
Glucose: 107 mg/dL — ABNORMAL HIGH (ref 70–99)
Potassium: 4.6 mmol/L (ref 3.5–5.2)
Sodium: 143 mmol/L (ref 134–144)
Total Protein: 7 g/dL (ref 6.0–8.5)
eGFR: 72 mL/min/{1.73_m2} (ref >59)

## 2022-05-23 LAB — CBC WITH DIFFERENTIAL/PLATELET
Basophils Absolute: 0.1 10*3/uL (ref 0.0–0.2)
Basos: 1 %
EOS (ABSOLUTE): 0.2 10*3/uL (ref 0.0–0.4)
Eos: 3 %
Hematocrit: 42.4 % (ref 37.5–51.0)
Hemoglobin: 14.6 g/dL (ref 13.0–17.7)
Immature Grans (Abs): 0 10*3/uL (ref 0.0–0.1)
Immature Granulocytes: 0 %
Lymphocytes Absolute: 1.3 10*3/uL (ref 0.7–3.1)
Lymphs: 22 %
MCH: 30.9 pg (ref 26.6–33.0)
MCHC: 34.4 g/dL (ref 31.5–35.7)
MCV: 90 fL (ref 79–97)
Monocytes Absolute: 0.6 10*3/uL (ref 0.1–0.9)
Monocytes: 9 %
Neutrophils Absolute: 3.9 10*3/uL (ref 1.4–7.0)
Neutrophils: 65 %
Platelets: 294 10*3/uL (ref 150–450)
RBC: 4.73 x10E6/uL (ref 4.14–5.80)
RDW: 12.2 % (ref 11.6–15.4)
WBC: 6 10*3/uL (ref 3.4–10.8)

## 2022-05-23 LAB — LIPID PANEL
Chol/HDL Ratio: 3.6 ratio (ref 0.0–5.0)
Cholesterol, Total: 199 mg/dL (ref 100–199)
HDL: 56 mg/dL (ref >39)
LDL Chol Calc (NIH): 126 mg/dL — ABNORMAL HIGH (ref 0–99)
Triglycerides: 93 mg/dL (ref 0–149)
VLDL Cholesterol Cal: 17 mg/dL (ref 5–40)

## 2022-05-23 LAB — HEMOGLOBIN A1C
Est. average glucose Bld gHb Est-mCnc: 126 mg/dL
Hgb A1c MFr Bld: 6 % — ABNORMAL HIGH (ref 4.8–5.6)

## 2022-05-23 LAB — CARDIOVASCULAR RISK ASSESSMENT

## 2022-05-24 ENCOUNTER — Other Ambulatory Visit: Payer: Self-pay | Admitting: Legal Medicine

## 2022-05-24 ENCOUNTER — Other Ambulatory Visit: Payer: Self-pay | Admitting: Nurse Practitioner

## 2022-05-24 DIAGNOSIS — I10 Essential (primary) hypertension: Secondary | ICD-10-CM

## 2022-05-24 DIAGNOSIS — M5416 Radiculopathy, lumbar region: Secondary | ICD-10-CM

## 2022-05-25 ENCOUNTER — Ambulatory Visit: Payer: BC Managed Care – PPO | Admitting: Nurse Practitioner

## 2022-05-26 ENCOUNTER — Other Ambulatory Visit: Payer: Self-pay | Admitting: Nurse Practitioner

## 2022-06-01 ENCOUNTER — Ambulatory Visit: Payer: BC Managed Care – PPO | Admitting: Cardiology

## 2022-08-21 ENCOUNTER — Ambulatory Visit: Payer: BC Managed Care – PPO | Admitting: Nurse Practitioner

## 2022-08-21 ENCOUNTER — Other Ambulatory Visit: Payer: Self-pay | Admitting: Nurse Practitioner

## 2022-08-21 DIAGNOSIS — I10 Essential (primary) hypertension: Secondary | ICD-10-CM

## 2022-08-24 ENCOUNTER — Other Ambulatory Visit: Payer: Self-pay | Admitting: Nurse Practitioner

## 2022-10-24 ENCOUNTER — Telehealth: Payer: Self-pay

## 2022-10-24 NOTE — Telephone Encounter (Signed)
I left a message on the number(s) listed in the patients chart requesting the patient to call back regarding the [place] appointment for 11/27/2022.  The appointment has been canceled. Waiting for the patient to return the call.   Rekha's last day is 11/03/2022. THIS APPOINTMENT CAN BE RESCHEDULED WITH Taris Galindo CRAFT, PA ON OR AROUND END OF APRIL OR FIRST OF MAY

## 2022-11-16 ENCOUNTER — Other Ambulatory Visit: Payer: Self-pay

## 2022-11-16 DIAGNOSIS — I10 Essential (primary) hypertension: Secondary | ICD-10-CM

## 2022-11-16 MED ORDER — VALSARTAN 80 MG PO TABS: ORAL_TABLET | ORAL | 2 refills | Status: DC

## 2022-11-18 ENCOUNTER — Other Ambulatory Visit: Payer: Self-pay | Admitting: Family Medicine

## 2022-11-18 DIAGNOSIS — K219 Gastro-esophageal reflux disease without esophagitis: Secondary | ICD-10-CM

## 2022-11-20 ENCOUNTER — Other Ambulatory Visit: Payer: Self-pay

## 2022-11-20 ENCOUNTER — Other Ambulatory Visit: Payer: Self-pay | Admitting: Family Medicine

## 2022-11-20 DIAGNOSIS — K219 Gastro-esophageal reflux disease without esophagitis: Secondary | ICD-10-CM

## 2022-11-20 MED ORDER — METFORMIN HCL 500 MG PO TABS: ORAL_TABLET | ORAL | 0 refills | Status: DC

## 2022-11-27 ENCOUNTER — Ambulatory Visit: Payer: BC Managed Care – PPO | Admitting: Nurse Practitioner

## 2022-12-23 ENCOUNTER — Other Ambulatory Visit: Payer: Self-pay | Admitting: Family Medicine

## 2022-12-23 DIAGNOSIS — K219 Gastro-esophageal reflux disease without esophagitis: Secondary | ICD-10-CM

## 2022-12-28 ENCOUNTER — Ambulatory Visit: Payer: BC Managed Care – PPO | Admitting: Nurse Practitioner

## 2023-01-21 ENCOUNTER — Other Ambulatory Visit: Payer: Self-pay

## 2023-01-21 DIAGNOSIS — M5416 Radiculopathy, lumbar region: Secondary | ICD-10-CM

## 2023-01-21 MED ORDER — GABAPENTIN 300 MG PO CAPS: ORAL_CAPSULE | ORAL | 0 refills | Status: DC

## 2023-02-20 ENCOUNTER — Other Ambulatory Visit: Payer: Self-pay | Admitting: Family Medicine

## 2023-02-20 DIAGNOSIS — I10 Essential (primary) hypertension: Secondary | ICD-10-CM

## 2023-03-19 ENCOUNTER — Other Ambulatory Visit: Payer: Self-pay | Admitting: Family Medicine

## 2023-03-19 DIAGNOSIS — I10 Essential (primary) hypertension: Secondary | ICD-10-CM

## 2023-03-24 ENCOUNTER — Other Ambulatory Visit: Payer: Self-pay | Admitting: Family Medicine

## 2023-03-24 DIAGNOSIS — K219 Gastro-esophageal reflux disease without esophagitis: Secondary | ICD-10-CM

## 2023-03-25 ENCOUNTER — Other Ambulatory Visit: Payer: Self-pay | Admitting: Family Medicine

## 2023-03-25 DIAGNOSIS — K219 Gastro-esophageal reflux disease without esophagitis: Secondary | ICD-10-CM

## 2023-03-26 DIAGNOSIS — R7303 Prediabetes: Secondary | ICD-10-CM | POA: Insufficient documentation

## 2023-03-26 NOTE — Assessment & Plan Note (Signed)
Recommend continue to work on eating healthy diet and exercise.  

## 2023-03-26 NOTE — Assessment & Plan Note (Signed)
Recommend continue to work on eating healthy diet and exercise.  Continue Prilosec 40 mg daily and Reglan

## 2023-03-26 NOTE — Progress Notes (Unsigned)
Subjective:  Patient ID: Gary Weaver, male    DOB: 03-16-1966  Age: 57 y.o. MRN: [id number]  No chief complaint on file.   HPI Pt presents for follow-up of chronic medical problems of HTN, Prediabetes, and GERD.   Prediabetes: Metformin 500 mg twice daily  Hyperlipidemia: Current medications:   Hypertension: Complications: Current medications: coreg and valsartan. Followed by Cardiology, Dr Servando Salina   GERD: Prilosec and Reglan.   Diet: Exercise:       05/22/2022    1:41 PM 10/25/2021   10:53 AM 11/25/2019    9:04 AM  Depression screen PHQ 2/9  Decreased Interest 0 0 0  Down, Depressed, Hopeless 0 0 0  PHQ - 2 Score 0 0 0  Altered sleeping 0    Tired, decreased energy 0    Change in appetite 0    Feeling bad or failure about yourself  0    Trouble concentrating 0    Moving slowly or fidgety/restless 0    Suicidal thoughts 0    PHQ-9 Score 0    Difficult doing work/chores Not difficult at all          05/22/2022    1:40 PM  Fall Risk   Falls in the past year? 0  Number falls in past yr: 0  Injury with Fall? 0  Risk for fall due to : No Fall Risks  Follow up Falls evaluation completed    Patient Care Team: Janie Morning, NP (Inactive) as PCP - General (Nurse Practitioner) Thomasene Ripple, DO as PCP - Cardiology (Cardiology) Karie Soda, MD as Consulting Physician (General Surgery) Danis, Andreas Blower, MD as Consulting Physician (Gastroenterology) Hedwig Morton., MD as Consulting Physician (Cardiology)   Review of Systems  Current Outpatient Medications on File Prior to Visit  Medication Sig Dispense Refill   albuterol (PROVENTIL HFA;VENTOLIN HFA) 108 (90 Base) MCG/ACT inhaler Inhale 2 puffs into the lungs every 6 (six) hours as needed for wheezing or shortness of breath.     carvedilol (COREG) 12.5 MG tablet TAKE 1 TABLET(12.5 MG) BY MOUTH TWICE DAILY 180 tablet 0   Cholecalciferol (VITAMIN D3) 50 MCG (2000 UT) TABS Take 1 tablet by mouth  daily.     cyclobenzaprine (FLEXERIL) 5 MG tablet Take 1 tablet (5 mg total) by mouth 3 (three) times daily as needed for muscle spasms. 30 tablet 1   fexofenadine (ALLEGRA) 180 MG tablet Take 180 mg by mouth daily.      fluticasone (FLONASE) 50 MCG/ACT nasal spray Place 1 spray into both nostrils daily. 11.1 mL 6   gabapentin (NEURONTIN) 300 MG capsule TAKE ONE CAPSULE BY MOUTH IN THE MORNING AND 2 CAPSULES IN THE EVENING 90 capsule 0   metFORMIN (GLUCOPHAGE) 500 MG tablet TAKE 1 TABLET(500 MG) BY MOUTH TWICE DAILY WITH A MEAL 60 tablet 0   metoCLOPramide (REGLAN) 5 MG tablet TAKE WEEKLY AS NEEDED FOR ACID REFLUX 30 tablet 3   omeprazole (PRILOSEC) 40 MG capsule TAKE 1 CAPSULE BY MOUTH DAILY 30 capsule 0   valsartan (DIOVAN) 80 MG tablet TAKE 1 TABLET BY MOUTH DAILY 30 tablet 0   No current facility-administered medications on file prior to visit.   Past Medical History:  Diagnosis Date   Anxiety    Arrhythmia    Arthritis    Asthma    Bile leak    Celiac disease    GERD (gastroesophageal reflux disease)    Gluten intolerance    Headache  Hearing loss    Hiatal hernia    Hx of acute pancreatitis 05/09/2017   Hyperlipidemia    Hypertension    Intra-abdominal fluid collection 06/20/2018   Memory loss    Pancreatitis    Pneumonia    hx of    Vision abnormalities    Past Surgical History:  Procedure Laterality Date   BILIARY STENT PLACEMENT  06/23/2018   Procedure: BILIARY STENT PLACEMENT;  Surgeon: Kerin Salen, MD;  Location: [facility] ENDOSCOPY;  Service: Gastroenterology;;   COLONOSCOPY     ERCP N/A 06/23/2018   Procedure: ENDOSCOPIC RETROGRADE CHOLANGIOPANCREATOGRAPHY (ERCP);  Surgeon: Kerin Salen, MD;  Location: [facility] ENDOSCOPY;  Service: Gastroenterology;  Laterality: N/A;   ESOPHAGOGASTRODUODENOSCOPY (EGD) WITH PROPOFOL N/A 07/23/2018   Procedure: ESOPHAGOGASTRODUODENOSCOPY (EGD) WITH PROPOFOL;  Surgeon: Sherrilyn Rist, MD;  Location: [facility] ENDOSCOPY;  Service:  Gastroenterology;  Laterality: N/A;   LAPAROSCOPIC CHOLECYSTECTOMY SINGLE SITE WITH INTRAOPERATIVE CHOLANGIOGRAM N/A 06/13/2018   Procedure: LAPAROSCOPIC CHOLECYSTECTOMY SINGLE SITE WITH INTRAOPERATIVE CHOLANGIOGRAM ERAS PATHWAY;  Surgeon: Karie Soda, MD;  Location: [facility] ORS;  Service: General;  Laterality: N/A;   SPHINCTEROTOMY  06/23/2018   Procedure: SPHINCTEROTOMY;  Surgeon: Kerin Salen, MD;  Location: [facility] ENDOSCOPY;  Service: Gastroenterology;;   STENT REMOVAL  07/23/2018   Procedure: STENT REMOVAL;  Surgeon: Sherrilyn Rist, MD;  Location: [facility] ENDOSCOPY;  Service: Gastroenterology;;   UMBILICAL HERNIA REPAIR N/A 06/13/2018   Procedure: LAPAROSCOPIC POSSIBLE OPEN UMBILICAL HERNIA;  Surgeon: Karie Soda, MD;  Location: [facility] ORS;  Service: General;  Laterality: N/A;   UPPER GASTROINTESTINAL ENDOSCOPY     WISDOM TOOTH EXTRACTION      Family History  Problem Relation Age of Onset   Hypertension Father    Prostate cancer Father    Colon polyps Father    Bladder Cancer Mother    Colon cancer Maternal Grandmother    Esophageal cancer Neg Hx    Rectal cancer Neg Hx    Stomach cancer Neg Hx    Pancreatic cancer Neg Hx    Social History   Socioeconomic History   Marital status: Married    Spouse name: Not on file   Number of children: 3   Years of education: Not on file   Highest education level: Not on file  Occupational History   Occupation: Surveyor, 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  Tobacco Use   Smoking status: Never   Smokeless tobacco: Current    Types: Chew   Tobacco comments:    ocasionally  Vaping Use   Vaping status: Never Used  Substance and Sexual Activity   Alcohol use: Yes    Alcohol/week: 2.0 standard drinks of alcohol    Types: 2 Standard drinks or equivalent per week    Comment: ocassionally   Drug use: No   Sexual activity: Yes    Partners: Female  Other Topics Concern   Not on file  Social History Narrative   Not on file   Social Determinants of Health    Financial Resource Strain: Low Risk  (05/22/2022)   Overall Financial Resource Strain (CARDIA)    Difficulty of Paying Living Expenses: Not hard at all  Food Insecurity: No Food Insecurity (05/22/2022)   Hunger Vital Sign    Worried About Running Out of Food in the Last Year: Never true    Ran Out of Food in the Last Year: Never true  Transportation Needs: No Transportation Needs (05/22/2022)   PRAPARE - Administrator, Civil Service (Medical): No  Lack of Transportation (Non-Medical): No  Physical Activity: Inactive (05/22/2022)   Exercise Vital Sign    Days of Exercise per Week: 0 days    Minutes of Exercise per Session: 0 min  Stress: No Stress Concern Present (05/22/2022)   [employer] of Occupational Health - Occupational Stress Questionnaire    Feeling of Stress : Not at all  Social Connections: Moderately Integrated (05/22/2022)   Social Connection and Isolation Panel [NHANES]    Frequency of Communication with Friends and Family: Three times a week    Frequency of Social Gatherings with Friends and Family: Three times a week    Attends Religious Services: More than 4 times per year    Active Member of Clubs or Organizations: No    Attends Banker Meetings: Never    Marital Status: Married    Objective:  There were no vitals taken for this visit.     05/22/2022    1:39 PM 02/15/2022   10:56 AM 10/25/2021   10:51 AM  BP/Weight  Systolic BP 124 110 110  Diastolic BP 84 78 84  Wt. (Lbs) 195 195.2   BMI 31.47 kg/m2 31.51 kg/m2     Physical Exam  Diabetic Foot Exam - Simple   No data filed      Lab Results  Component Value Date   WBC 6.0 05/22/2022   HGB 14.6 05/22/2022   HCT 42.4 05/22/2022   PLT 294 05/22/2022   GLUCOSE 107 (H) 05/22/2022   CHOL 199 05/22/2022   TRIG 93 05/22/2022   HDL 56 05/22/2022   LDLCALC 126 (H) 05/22/2022   ALT 19 05/22/2022   AST 20 05/22/2022   NA 143 05/22/2022   K 4.6 05/22/2022   CL 104  05/22/2022   CREATININE 1.19 05/22/2022   BUN 12 05/22/2022   CO2 22 05/22/2022   TSH 1.350 11/14/2016   INR 1.13 06/21/2018   HGBA1C 6.0 (H) 05/22/2022      Assessment & Plan:    There are no diagnoses linked to this encounter.   No orders of the defined types were placed in this encounter.   No orders of the defined types were placed in this encounter.    Follow-up: No follow-ups on file.   I,Jacqua L Marsh,acting as a scribe for [employer], PA.,have documented all relevant documentation on the behalf of Howland Gauss, PA,as directed by  Carbonell Gauss, PA while in the presence of Norwood Gauss, [place].   An After Visit Summary was printed and given to the patient.  Bi Gauss, [place] [facility] [phone]

## 2023-03-26 NOTE — Assessment & Plan Note (Signed)
Well controlled.  No changes to medicines. Continue Coreg, and valsartan Continue to work on eating a healthy diet and exercise.  Labs drawn today.

## 2023-03-26 NOTE — Assessment & Plan Note (Signed)
Well controlled.  No changes to medicines. Metformin  Continue to work on eating a healthy diet and exercise.  Labs drawn today.

## 2023-03-27 ENCOUNTER — Ambulatory Visit: Payer: BC Managed Care – PPO | Admitting: Physician Assistant

## 2023-03-27 ENCOUNTER — Other Ambulatory Visit: Payer: Self-pay | Admitting: Physician Assistant

## 2023-03-27 ENCOUNTER — Encounter: Payer: Self-pay | Admitting: Physician Assistant

## 2023-03-27 VITALS — BP 112/72 | HR 73 | Temp 97.0°F | Ht 66.0 in | Wt 196.0 lb

## 2023-03-27 DIAGNOSIS — K219 Gastro-esophageal reflux disease without esophagitis: Secondary | ICD-10-CM | POA: Diagnosis not present

## 2023-03-27 DIAGNOSIS — M62838 Other muscle spasm: Secondary | ICD-10-CM | POA: Insufficient documentation

## 2023-03-27 DIAGNOSIS — E669 Obesity, unspecified: Secondary | ICD-10-CM

## 2023-03-27 DIAGNOSIS — M5416 Radiculopathy, lumbar region: Secondary | ICD-10-CM

## 2023-03-27 DIAGNOSIS — I10 Essential (primary) hypertension: Secondary | ICD-10-CM | POA: Diagnosis not present

## 2023-03-27 DIAGNOSIS — J301 Allergic rhinitis due to pollen: Secondary | ICD-10-CM | POA: Insufficient documentation

## 2023-03-27 DIAGNOSIS — Z23 Encounter for immunization: Secondary | ICD-10-CM | POA: Diagnosis not present

## 2023-03-27 DIAGNOSIS — R7303 Prediabetes: Secondary | ICD-10-CM | POA: Diagnosis not present

## 2023-03-27 LAB — CBC WITH DIFFERENTIAL/PLATELET
Basophils Absolute: 0.1 10*3/uL (ref 0.0–0.2)
Basos: 1 %
EOS (ABSOLUTE): 0.2 10*3/uL (ref 0.0–0.4)
Eos: 4 %
Hematocrit: 44.7 % (ref 37.5–51.0)
Hemoglobin: 14.9 g/dL (ref 13.0–17.7)
Immature Grans (Abs): 0 10*3/uL (ref 0.0–0.1)
Immature Granulocytes: 0 %
Lymphocytes Absolute: 1.2 10*3/uL (ref 0.7–3.1)
Lymphs: 19 %
MCH: 30.3 pg (ref 26.6–33.0)
MCHC: 33.3 g/dL (ref 31.5–35.7)
MCV: 91 fL (ref 79–97)
Monocytes Absolute: 0.6 10*3/uL (ref 0.1–0.9)
Monocytes: 10 %
Neutrophils Absolute: 4.2 10*3/uL (ref 1.4–7.0)
Neutrophils: 66 %
Platelets: 298 10*3/uL (ref 150–450)
RBC: 4.92 x10E6/uL (ref 4.14–5.80)
RDW: 12.2 % (ref 11.6–15.4)
WBC: 6.3 10*3/uL (ref 3.4–10.8)

## 2023-03-27 LAB — LIPID PANEL
Chol/HDL Ratio: 3.6 ratio (ref 0.0–5.0)
Cholesterol, Total: 197 mg/dL (ref 100–199)
HDL: 54 mg/dL (ref >39)
LDL Chol Calc (NIH): 127 mg/dL — ABNORMAL HIGH (ref 0–99)
Triglycerides: 89 mg/dL (ref 0–149)
VLDL Cholesterol Cal: 16 mg/dL (ref 5–40)

## 2023-03-27 LAB — CMP14+EGFR
ALT: 20 IU/L (ref 0–44)
AST: 17 IU/L (ref 0–40)
Albumin: 5.1 g/dL — ABNORMAL HIGH (ref 3.8–4.9)
Alkaline Phosphatase: 63 IU/L (ref 44–121)
BUN/Creatinine Ratio: 10 (ref 9–20)
BUN: 13 mg/dL (ref 6–24)
Bilirubin Total: 1.2 mg/dL (ref 0.0–1.2)
CO2: 22 mmol/L (ref 20–29)
Calcium: 10.3 mg/dL — ABNORMAL HIGH (ref 8.7–10.2)
Chloride: 104 mmol/L (ref 96–106)
Creatinine, Ser: 1.32 mg/dL — ABNORMAL HIGH (ref 0.76–1.27)
Globulin, Total: 1.7 g/dL (ref 1.5–4.5)
Glucose: 119 mg/dL — ABNORMAL HIGH (ref 70–99)
Potassium: 5.1 mmol/L (ref 3.5–5.2)
Sodium: 141 mmol/L (ref 134–144)
Total Protein: 6.8 g/dL (ref 6.0–8.5)
eGFR: 63 mL/min/{1.73_m2} (ref >59)

## 2023-03-27 LAB — HEMOGLOBIN A1C
Est. average glucose Bld gHb Est-mCnc: 128 mg/dL
Hgb A1c MFr Bld: 6.1 % — ABNORMAL HIGH (ref 4.8–5.6)

## 2023-03-27 MED ORDER — CYCLOBENZAPRINE HCL 5 MG PO TABS: 5.0000 mg | ORAL_TABLET | Freq: Three times a day (TID) | ORAL | 1 refills | Status: DC | PRN

## 2023-03-27 MED ORDER — OMEPRAZOLE 40 MG PO CPDR
40.0000 mg | DELAYED_RELEASE_CAPSULE | Freq: Every day | ORAL | 0 refills | Status: DC
Start: 2023-03-27 — End: 2023-03-27

## 2023-03-27 MED ORDER — VALSARTAN 80 MG PO TABS
ORAL_TABLET | ORAL | 0 refills | Status: DC
Start: 2023-03-27 — End: 2023-03-27

## 2023-03-27 MED ORDER — CARVEDILOL 12.5 MG PO TABS
ORAL_TABLET | ORAL | 0 refills | Status: DC
Start: 2023-03-27 — End: 2023-10-02

## 2023-03-27 MED ORDER — METOCLOPRAMIDE HCL 5 MG PO TABS
5.0000 mg | ORAL_TABLET | ORAL | 3 refills | Status: DC
Start: 2023-03-27 — End: 2023-10-02

## 2023-03-27 MED ORDER — METFORMIN HCL 500 MG PO TABS
ORAL_TABLET | ORAL | 0 refills | Status: DC
Start: 2023-03-27 — End: 2023-03-27

## 2023-03-27 MED ORDER — OMEPRAZOLE 40 MG PO CPDR
40.0000 mg | DELAYED_RELEASE_CAPSULE | Freq: Every day | ORAL | 3 refills | Status: DC
Start: 2023-03-27 — End: 2023-09-03

## 2023-03-27 MED ORDER — CYCLOBENZAPRINE HCL 5 MG PO TABS
5.0000 mg | ORAL_TABLET | Freq: Three times a day (TID) | ORAL | 1 refills | Status: DC | PRN
Start: 2023-03-27 — End: 2023-03-27

## 2023-03-27 MED ORDER — METFORMIN HCL 500 MG PO TABS
ORAL_TABLET | ORAL | 3 refills | Status: DC
Start: 2023-03-27 — End: 2023-10-02

## 2023-03-27 MED ORDER — VITAMIN D3 50 MCG (2000 UT) PO TABS: 1.0000 | ORAL_TABLET | Freq: Every day | ORAL | 3 refills | Status: DC

## 2023-03-27 MED ORDER — GABAPENTIN 300 MG PO CAPS
ORAL_CAPSULE | ORAL | 0 refills | Status: DC
Start: 2023-03-27 — End: 2023-04-26

## 2023-03-27 MED ORDER — VALSARTAN 80 MG PO TABS
ORAL_TABLET | ORAL | 3 refills | Status: DC
Start: 2023-03-27 — End: 2023-05-31

## 2023-03-27 MED ORDER — ALBUTEROL SULFATE HFA 108 (90 BASE) MCG/ACT IN AERS
2.0000 | INHALATION_SPRAY | Freq: Four times a day (QID) | RESPIRATORY_TRACT | 3 refills | Status: DC | PRN
Start: 2023-03-27 — End: 2023-10-02

## 2023-03-27 MED ORDER — FEXOFENADINE HCL 180 MG PO TABS
180.0000 mg | ORAL_TABLET | Freq: Every day | ORAL | 3 refills | Status: DC
Start: 2023-03-27 — End: 2023-10-02

## 2023-03-27 MED ORDER — FLUTICASONE PROPIONATE 50 MCG/ACT NA SUSP
1.0000 | Freq: Every day | NASAL | 6 refills | Status: DC
Start: 2023-03-27 — End: 2023-10-02

## 2023-03-28 ENCOUNTER — Other Ambulatory Visit: Payer: Self-pay | Admitting: Physician Assistant

## 2023-03-28 DIAGNOSIS — Z1211 Encounter for screening for malignant neoplasm of colon: Secondary | ICD-10-CM

## 2023-04-25 ENCOUNTER — Other Ambulatory Visit: Payer: Self-pay | Admitting: Physician Assistant

## 2023-04-25 DIAGNOSIS — E7800 Pure hypercholesterolemia, unspecified: Secondary | ICD-10-CM

## 2023-04-25 MED ORDER — ATORVASTATIN CALCIUM 10 MG PO TABS: 10.0000 mg | ORAL_TABLET | Freq: Every day | ORAL | 1 refills | Status: DC

## 2023-04-26 ENCOUNTER — Other Ambulatory Visit: Payer: Self-pay

## 2023-04-26 DIAGNOSIS — M5416 Radiculopathy, lumbar region: Secondary | ICD-10-CM

## 2023-04-26 MED ORDER — GABAPENTIN 300 MG PO CAPS
ORAL_CAPSULE | ORAL | 1 refills | Status: DC
Start: 2023-04-26 — End: 2023-10-02

## 2023-05-11 ENCOUNTER — Ambulatory Visit: Payer: BC Managed Care – PPO

## 2023-05-11 VITALS — BP 94/70 | HR 81 | Temp 97.6°F | Resp 14 | Ht 66.0 in | Wt 193.0 lb

## 2023-05-11 DIAGNOSIS — J018 Other acute sinusitis: Secondary | ICD-10-CM | POA: Diagnosis not present

## 2023-05-11 DIAGNOSIS — R051 Acute cough: Secondary | ICD-10-CM | POA: Diagnosis not present

## 2023-05-11 DIAGNOSIS — J3489 Other specified disorders of nose and nasal sinuses: Secondary | ICD-10-CM | POA: Insufficient documentation

## 2023-05-11 DIAGNOSIS — J019 Acute sinusitis, unspecified: Secondary | ICD-10-CM | POA: Insufficient documentation

## 2023-05-11 LAB — POCT INFLUENZA A/B
Influenza A, POC: NEGATIVE
Influenza B, POC: NEGATIVE

## 2023-05-11 LAB — POC COVID19 BINAXNOW: SARS Coronavirus 2 Ag: NEGATIVE

## 2023-05-11 MED ORDER — PREDNISONE 20 MG PO TABS: 40.0000 mg | ORAL_TABLET | Freq: Every day | ORAL | 0 refills | Status: AC

## 2023-05-11 MED ORDER — AMOXICILLIN 500 MG PO CAPS: 500.0000 mg | ORAL_CAPSULE | Freq: Three times a day (TID) | ORAL | 0 refills | Status: AC

## 2023-05-11 NOTE — Assessment & Plan Note (Addendum)
Gary Weaver is here with symptoms of sinus pressure, some facial pain, persistent cough, congestion going on for a little more than a week. Over-the-counter medications have not helped. He has been using his medications including his inhaler, Allegra, Flonase etc.  On exam, he has some maxillary sinus tenderness, mild effusion behind tympanic membranes.  Plan: Could be still a viral upper respiratory infection turning into maxillary sinusitis.  It is reassuring that he had negative COVID and flu test in office today -Prescribed prednisone 40 mg daily for the next 7 days. -Advised to continue supportive treatment with plenty of fluids, steam etc. A prescription of antibiotic amoxicillin 500 mg 3 times daily for 7 days to his pharmacy only to take if absolutely through the next week.   He does understand not to take it sooner than that as it could still be a viral infection and antibiotic may not help and in fact heard by increasing antibiotic resistance.  Report back if any worsening symptoms or call 911 for any severe symptoms

## 2023-05-11 NOTE — Progress Notes (Signed)
Acute Office Visit  Subjective:    Patient ID: Gary Weaver, male    DOB: April 20, 1966, 57 y.o.   MRN: [id number]  Chief Complaint  Patient presents with   Sinusitis   Cough    HPI: Patient is in today for sinus pressure, productive cough with phlegm green-brownish. Nasal congestion with yellowish with blood since 6 days ago. He had fever around 100.0 F. He mentioned chest sore for coughing, chills last night. He has been taking mucinex, but it did not help him.  Symptoms for a week now. Feels like symptoms are moving into his chest.      Past Medical History:  Diagnosis Date   Anxiety    Arrhythmia    Arthritis    Asthma    Bile leak    Celiac disease    GERD (gastroesophageal reflux disease)    Gluten intolerance    Headache    Hearing loss    Hiatal hernia    Hx of acute pancreatitis 05/09/2017   Hyperlipidemia    Hypertension    Intra-abdominal fluid collection 06/20/2018   Memory loss    Pancreatitis    Pneumonia    hx of    Vision abnormalities     Past Surgical History:  Procedure Laterality Date   BILIARY STENT PLACEMENT  06/23/2018   Procedure: BILIARY STENT PLACEMENT;  Surgeon: Kerin Salen, MD;  Location: [facility] ENDOSCOPY;  Service: Gastroenterology;;   COLONOSCOPY     ERCP N/A 06/23/2018   Procedure: ENDOSCOPIC RETROGRADE CHOLANGIOPANCREATOGRAPHY (ERCP);  Surgeon: Kerin Salen, MD;  Location: [facility] ENDOSCOPY;  Service: Gastroenterology;  Laterality: N/A;   ESOPHAGOGASTRODUODENOSCOPY (EGD) WITH PROPOFOL N/A 07/23/2018   Procedure: ESOPHAGOGASTRODUODENOSCOPY (EGD) WITH PROPOFOL;  Surgeon: Sherrilyn Rist, MD;  Location: [facility] ENDOSCOPY;  Service: Gastroenterology;  Laterality: N/A;   LAPAROSCOPIC CHOLECYSTECTOMY SINGLE SITE WITH INTRAOPERATIVE CHOLANGIOGRAM N/A 06/13/2018   Procedure: LAPAROSCOPIC CHOLECYSTECTOMY SINGLE SITE WITH INTRAOPERATIVE CHOLANGIOGRAM ERAS PATHWAY;  Surgeon: Karie Soda, MD;  Location: [facility] ORS;  Service: General;  Laterality:  N/A;   SPHINCTEROTOMY  06/23/2018   Procedure: SPHINCTEROTOMY;  Surgeon: Kerin Salen, MD;  Location: [facility] ENDOSCOPY;  Service: Gastroenterology;;   STENT REMOVAL  07/23/2018   Procedure: STENT REMOVAL;  Surgeon: Sherrilyn Rist, MD;  Location: [facility] ENDOSCOPY;  Service: Gastroenterology;;   UMBILICAL HERNIA REPAIR N/A 06/13/2018   Procedure: LAPAROSCOPIC POSSIBLE OPEN UMBILICAL HERNIA;  Surgeon: Karie Soda, MD;  Location: [facility] ORS;  Service: General;  Laterality: N/A;   UPPER GASTROINTESTINAL ENDOSCOPY     WISDOM TOOTH EXTRACTION      Family History  Problem Relation Age of Onset   Hypertension Father    Prostate cancer Father    Colon polyps Father    Bladder Cancer Mother    Colon cancer Maternal Grandmother    Esophageal cancer Neg Hx    Rectal cancer Neg Hx    Stomach cancer Neg Hx    Pancreatic cancer Neg Hx     Social History   Socioeconomic History   Marital status: Married    Spouse name: Not on file   Number of children: 3   Years of education: Not on file   Highest education level: Not on file  Occupational History   Occupation: Surveyor, 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  Tobacco Use   Smoking status: Never   Smokeless tobacco: Current    Types: Chew   Tobacco comments:    ocasionally  Vaping Use   Vaping status: Never Used  Substance  and Sexual Activity   Alcohol use: Yes    Alcohol/week: 2.0 standard drinks of alcohol    Types: 2 Standard drinks or equivalent per week    Comment: ocassionally   Drug use: No   Sexual activity: Yes    Partners: Female  Other Topics Concern   Not on file  Social History Narrative   Not on file   Social Determinants of Health   Financial Resource Strain: Low Risk  (03/27/2023)   Overall Financial Resource Strain (CARDIA)    Difficulty of Paying Living Expenses: Not hard at all  Food Insecurity: No Food Insecurity (03/27/2023)   Hunger Vital Sign    Worried About Running Out of Food in the Last Year: Never true    Ran Out of  Food in the Last Year: Never true  Transportation Needs: No Transportation Needs (03/27/2023)   PRAPARE - Administrator, Civil Service (Medical): No    Lack of Transportation (Non-Medical): No  Physical Activity: Inactive (03/27/2023)   Exercise Vital Sign    Days of Exercise per Week: 0 days    Minutes of Exercise per Session: 0 min  Stress: No Stress Concern Present (03/27/2023)   [employer] of Occupational Health - Occupational Stress Questionnaire    Feeling of Stress : Not at all  Social Connections: Moderately Integrated (03/27/2023)   Social Connection and Isolation Panel [NHANES]    Frequency of Communication with Friends and Family: More than three times a week    Frequency of Social Gatherings with Friends and Family: More than three times a week    Attends Religious Services: More than 4 times per year    Active Member of [employer] or Organizations: No    Attends Banker Meetings: Never    Marital Status: Married  Catering manager Violence: Not At Risk (03/27/2023)   Humiliation, Afraid, Rape, and Kick questionnaire    Fear of Current or Ex-Partner: No    Emotionally Abused: No    Physically Abused: No    Sexually Abused: No    Outpatient Medications Prior to Visit  Medication Sig Dispense Refill   albuterol (VENTOLIN HFA) 108 (90 Base) MCG/ACT inhaler Inhale 2 puffs into the lungs every 6 (six) hours as needed for wheezing or shortness of breath. 18 g 3   atorvastatin (LIPITOR) 10 MG tablet Take 1 tablet (10 mg total) by mouth daily. 30 tablet 1   carvedilol (COREG) 12.5 MG tablet TAKE 1 TABLET(12.5 MG) BY MOUTH TWICE DAILY 180 tablet 0   Cholecalciferol (VITAMIN D3) 50 MCG (2000 UT) TABS Take 1 tablet (50 mcg total) by mouth daily. 90 tablet 3   cyclobenzaprine (FLEXERIL) 5 MG tablet Take 1 tablet (5 mg total) by mouth 3 (three) times daily as needed for muscle spasms. 90 tablet 1   fexofenadine (ALLEGRA) 180 MG tablet Take 1 tablet (180 mg  total) by mouth daily. 90 tablet 3   fluticasone (FLONASE) 50 MCG/ACT nasal spray Place 1 spray into both nostrils daily. 11.1 mL 6   gabapentin (NEURONTIN) 300 MG capsule TAKE ONE CAPSULE BY MOUTH IN THE MORNING AND 2 CAPSULES IN THE EVENING 270 capsule 1   metFORMIN (GLUCOPHAGE) 500 MG tablet TAKE 1 TABLET(500 MG) BY MOUTH TWICE DAILY WITH A MEAL 180 tablet 3   metoCLOPramide (REGLAN) 5 MG tablet Take 1 tablet (5 mg total) by mouth once a week. TAKE WEEKLY AS NEEDED FOR ACID REFLUX 30 tablet 3   omeprazole (PRILOSEC) 40  MG capsule Take 1 capsule (40 mg total) by mouth daily. 90 capsule 3   valsartan (DIOVAN) 80 MG tablet Take 1 tablet by mouth daily 90 tablet 3   No facility-administered medications prior to visit.    Allergies  Allergen Reactions   Egg-Derived Products Diarrhea   Gluten Meal Nausea And Vomiting   Latex Rash    Occurs after long use of latex    Review of Systems  Constitutional:  Positive for chills and fatigue. Negative for fever and unexpected weight change.  HENT:  Positive for congestion, ear pain (right ear pain), postnasal drip, sinus pressure and sinus pain. Negative for sore throat.   Respiratory:  Positive for cough, shortness of breath and wheezing.   Cardiovascular:  Negative for chest pain and palpitations.  Gastrointestinal:  Negative for abdominal pain, blood in stool, constipation, diarrhea, nausea and vomiting.  Endocrine: Negative for polydipsia.  Genitourinary:  Negative for dysuria.  Musculoskeletal:  Negative for back pain.  Skin:  Negative for rash.  Neurological:  Negative for headaches.       Objective:        05/11/2023    8:37 AM 03/27/2023   10:09 AM 05/22/2022    1:39 PM  Vitals with BMI  Height 5\' 6"  5\' 6"  5\' 6"   Weight 193 lbs 196 lbs 195 lbs  BMI 31.17 31.65 31.49  Systolic 94 112 124  Diastolic 70 72 84  Pulse 81 73 82    No data found.   Physical Exam Constitutional:      Appearance: Normal appearance. He is  ill-appearing.  HENT:     Head: Normocephalic and atraumatic.     Comments: Maxillary sinus tenderness bilaterally    Ears:     Comments: Slightly bulging TMs    Nose: Congestion present.     Mouth/Throat:     Mouth: Mucous membranes are moist.     Pharynx: Posterior oropharyngeal erythema present.  Cardiovascular:     Rate and Rhythm: Normal rate and regular rhythm.  Pulmonary:     Effort: Pulmonary effort is normal.     Breath sounds: Normal breath sounds.  Musculoskeletal:        General: Normal range of motion.     Cervical back: Normal range of motion.  Skin:    General: Skin is warm.  Neurological:     Mental Status: He is alert.     Health Maintenance Due  Topic Date Due   Colonoscopy  03/23/2018   INFLUENZA VACCINE  03/15/2023   COVID-19 Vaccine (3 - 2023-24 season) 04/15/2023    There are no preventive care reminders to display for this patient.   Lab Results  Component Value Date   TSH 1.350 11/14/2016   Lab Results  Component Value Date   WBC 6.3 03/27/2023   HGB 14.9 03/27/2023   HCT 44.7 03/27/2023   MCV 91 03/27/2023   PLT 298 03/27/2023   Lab Results  Component Value Date   NA 141 03/27/2023   K 5.1 03/27/2023   CO2 22 03/27/2023   GLUCOSE 119 (H) 03/27/2023   BUN 13 03/27/2023   CREATININE 1.32 (H) 03/27/2023   BILITOT 1.2 03/27/2023   ALKPHOS 63 03/27/2023   AST 17 03/27/2023   ALT 20 03/27/2023   PROT 6.8 03/27/2023   ALBUMIN 5.1 (H) 03/27/2023   CALCIUM 10.3 (H) 03/27/2023   ANIONGAP 8 06/24/2018   EGFR 63 03/27/2023   GFR 72.02 05/07/2017   Lab Results  Component Value Date   CHOL 197 03/27/2023   Lab Results  Component Value Date   HDL 54 03/27/2023   Lab Results  Component Value Date   LDLCALC 127 (H) 03/27/2023   Lab Results  Component Value Date   TRIG 89 03/27/2023   Lab Results  Component Value Date   CHOLHDL 3.6 03/27/2023   Lab Results  Component Value Date   HGBA1C 6.1 (H) 03/27/2023        Assessment & Plan:  Acute non-recurrent sinusitis of other sinus Assessment & Plan: Denvil is here with symptoms of sinus pressure, some facial pain, persistent cough, congestion going on for a little more than a week. Over-the-counter medications have not helped. He has been using his medications including his inhaler, Allegra, Flonase etc.  On exam, he has some maxillary sinus tenderness, mild effusion behind tympanic membranes.  Plan: Could be still a viral upper respiratory infection turning into maxillary sinusitis.  It is reassuring that he had negative COVID and flu test in office today -Prescribed prednisone 40 mg daily for the next 7 days. -Advised to continue supportive treatment with plenty of fluids, steam etc. A prescription of antibiotic amoxicillin 500 mg 3 times daily for 7 days to his pharmacy only to take if absolutely through the next week.   He does understand not to take it sooner than that as it could still be a viral infection and antibiotic may not help and in fact heard by increasing antibiotic resistance.  Report back if any worsening symptoms or call 911 for any severe symptoms   Orders: -     POC COVID-19 BinaxNow -     POCT Influenza A/B  Sinus pressure  Acute cough  Other orders -     Amoxicillin; Take 1 capsule (500 mg total) by mouth 3 (three) times daily for 7 days.  Dispense: 21 capsule; Refill: 0 -     predniSONE; Take 2 tablets (40 mg total) by mouth daily with breakfast for 7 days.  Dispense: 14 tablet; Refill: 0     Meds ordered this encounter  Medications   amoxicillin (AMOXIL) 500 MG capsule    Sig: Take 1 capsule (500 mg total) by mouth 3 (three) times daily for 7 days.    Dispense:  21 capsule    Refill:  0   predniSONE (DELTASONE) 20 MG tablet    Sig: Take 2 tablets (40 mg total) by mouth daily with breakfast for 7 days.    Dispense:  14 tablet    Refill:  0    Orders Placed This Encounter  Procedures   POC COVID-19   Influenza  A/B     Follow-up: Return if symptoms worsen or fail to improve.  An After Visit Summary was printed and given to the patient.  Windell Moment, MD [facility] [phone]

## 2023-05-11 NOTE — Patient Instructions (Signed)
Appears to be a viral upper respiratory infection turning into viral bronchitis Will send prednisone to your pharmacy Continue using allegra, flonase, steam, lots of fluids If you do not feel any better next week and have more sinus pressure or fever etc, then start the antibiotic. Report back if any worsening symptoms or call 911 for any severe symptoms

## 2023-05-18 ENCOUNTER — Other Ambulatory Visit: Payer: Self-pay | Admitting: Physician Assistant

## 2023-05-18 DIAGNOSIS — E7800 Pure hypercholesterolemia, unspecified: Secondary | ICD-10-CM

## 2023-06-12 ENCOUNTER — Other Ambulatory Visit: Payer: Self-pay | Admitting: Physician Assistant

## 2023-06-12 DIAGNOSIS — E7800 Pure hypercholesterolemia, unspecified: Secondary | ICD-10-CM

## 2023-07-02 ENCOUNTER — Ambulatory Visit: Payer: BC Managed Care – PPO | Admitting: Gastroenterology

## 2023-07-02 ENCOUNTER — Encounter: Payer: Self-pay | Admitting: Gastroenterology

## 2023-07-02 VITALS — BP 110/62 | HR 71 | Ht 66.0 in | Wt 195.0 lb

## 2023-07-02 DIAGNOSIS — Z1211 Encounter for screening for malignant neoplasm of colon: Secondary | ICD-10-CM

## 2023-07-02 DIAGNOSIS — R1011 Right upper quadrant pain: Secondary | ICD-10-CM

## 2023-07-02 DIAGNOSIS — K219 Gastro-esophageal reflux disease without esophagitis: Secondary | ICD-10-CM | POA: Diagnosis not present

## 2023-07-02 DIAGNOSIS — R1013 Epigastric pain: Secondary | ICD-10-CM | POA: Diagnosis not present

## 2023-07-02 MED ORDER — NA SULFATE-K SULFATE-MG SULF 17.5-3.13-1.6 GM/177ML PO SOLN
1.0000 | Freq: Once | ORAL | 0 refills | Status: AC
Start: 2023-07-02 — End: 2023-07-02

## 2023-07-02 NOTE — Patient Instructions (Signed)
You have been scheduled for an endoscopy and colonoscopy. Please follow the written instructions given to you at your visit today.  Please pick up your prep supplies at the pharmacy within the next 1-3 days.  If you use inhalers (even only as needed), please bring them with you on the day of your procedure.  DO NOT TAKE 7 DAYS PRIOR TO TEST- Trulicity (dulaglutide) Ozempic, Wegovy (semaglutide) Mounjaro (tirzepatide) Bydureon Bcise (exanatide extended release)  DO NOT TAKE 1 DAY PRIOR TO YOUR TEST Rybelsus (semaglutide) Adlyxin (lixisenatide) Victoza (liraglutide) Byetta (exanatide) ________________________________________________________________  _______________________________________________________  If your blood pressure at your visit was 140/90 or greater, please contact your primary care physician to follow up on this.  _______________________________________________________  If you are age 57 or older, your body mass index should be between 23-30. Your Body mass index is 31.47 kg/m. If this is out of the aforementioned range listed, please consider follow up with your Primary Care Provider.  If you are age 32 or younger, your body mass index should be between 19-25. Your Body mass index is 31.47 kg/m. If this is out of the aformentioned range listed, please consider follow up with your Primary Care Provider.   ________________________________________________________  The [place] [facility] providers would like to encourage you to use [facility] to communicate with providers for non-urgent requests or questions.  Due to long hold times on the telephone, sending your provider a message by [facility] may be a faster and more efficient way to get a response.  Please allow 48 business hours for a response.  Please remember that this is for non-urgent requests.  _______________________________________________________

## 2023-07-02 NOTE — Progress Notes (Signed)
07/02/2023 Gary Weaver [id number] [birth year]   HISTORY OF PRESENT ILLNESS: This is a 57 year old male who is a patient of Dr. Irving Burton.  He had some issues with pancreatitis back in 2018/2019.  He ultimately underwent cholecystectomy in October 2019.  He is actually here today to discuss and schedule a colonoscopy.  Referred here by his PCP, Silos Gauss, PA.  His last colonoscopy was in August 2019 by Dr. Chales Abrahams in [place] done for complaints of chronic diarrhea.  It was complete exam to the terminal ileum and was normal except for diverticulosis and internal hemorrhoids.  He tells me that in regards to the gallbladder and pancreatitis he has done well since he was last seen here in 2019.  He has had a couple of episodes of epigastric abdominal pain that feels similar to when he had the pancreatitis.  He usually will put himself on a liquid diet for a few days and the pain subsides.  He gets some right upper quadrant abdominal pain that is more of a nagging discomfort and that occurs more frequently than the epigastric pain.  LFTs by his PCP have been normal.  He is also interested in an endoscopy.  He said despite taking his omeprazole 40 mg daily that he still has a lot of heartburn and reflux issues.  No dysphagia.  Past Medical History:  Diagnosis Date   Anxiety    Arrhythmia    Arthritis    Asthma    Bile leak    Celiac disease    GERD (gastroesophageal reflux disease)    Gluten intolerance    Headache    Hearing loss    Hiatal hernia    Hx of acute pancreatitis 05/09/2017   Hyperlipidemia    Hypertension    Intra-abdominal fluid collection 06/20/2018   Memory loss    Pancreatitis    Pneumonia    hx of    Vision abnormalities    Past Surgical History:  Procedure Laterality Date   BILIARY STENT PLACEMENT  06/23/2018   Procedure: BILIARY STENT PLACEMENT;  Surgeon: Kerin Salen, MD;  Location: [facility] ENDOSCOPY;  Service: Gastroenterology;;   COLONOSCOPY     ERCP N/A  06/23/2018   Procedure: ENDOSCOPIC RETROGRADE CHOLANGIOPANCREATOGRAPHY (ERCP);  Surgeon: Kerin Salen, MD;  Location: [facility] ENDOSCOPY;  Service: Gastroenterology;  Laterality: N/A;   ESOPHAGOGASTRODUODENOSCOPY (EGD) WITH PROPOFOL N/A 07/23/2018   Procedure: ESOPHAGOGASTRODUODENOSCOPY (EGD) WITH PROPOFOL;  Surgeon: Sherrilyn Rist, MD;  Location: [facility] ENDOSCOPY;  Service: Gastroenterology;  Laterality: N/A;   LAPAROSCOPIC CHOLECYSTECTOMY SINGLE SITE WITH INTRAOPERATIVE CHOLANGIOGRAM N/A 06/13/2018   Procedure: LAPAROSCOPIC CHOLECYSTECTOMY SINGLE SITE WITH INTRAOPERATIVE CHOLANGIOGRAM ERAS PATHWAY;  Surgeon: Karie Soda, MD;  Location: [facility] ORS;  Service: General;  Laterality: N/A;   SPHINCTEROTOMY  06/23/2018   Procedure: Dennison Mascot;  Surgeon: Kerin Salen, MD;  Location: [facility] ENDOSCOPY;  Service: Gastroenterology;;   STENT REMOVAL  07/23/2018   Procedure: STENT REMOVAL;  Surgeon: Sherrilyn Rist, MD;  Location: [facility] ENDOSCOPY;  Service: Gastroenterology;;   UMBILICAL HERNIA REPAIR N/A 06/13/2018   Procedure: LAPAROSCOPIC POSSIBLE OPEN UMBILICAL HERNIA;  Surgeon: Karie Soda, MD;  Location: [facility] ORS;  Service: General;  Laterality: N/A;   UPPER GASTROINTESTINAL ENDOSCOPY     WISDOM TOOTH EXTRACTION      reports that he has never smoked. His smokeless tobacco use includes chew. He reports current alcohol use of about 2.0 standard drinks of alcohol per week. He reports that he does not use drugs. family history  includes Bladder Cancer in his mother; Colon cancer in his maternal grandmother; Colon polyps in his father; Hypertension in his father; Prostate cancer in his father. Allergies  Allergen Reactions   Egg-Derived Products Diarrhea   Gluten Meal Nausea And Vomiting   Latex Rash    Occurs after long use of latex      Outpatient Encounter Medications as of 07/02/2023  Medication Sig   albuterol (VENTOLIN HFA) 108 (90 Base) MCG/ACT inhaler Inhale 2 puffs into the lungs every 6 (six) hours  as needed for wheezing or shortness of breath.   atorvastatin (LIPITOR) 10 MG tablet TAKE 1 TABLET(10 MG) BY MOUTH DAILY   carvedilol (COREG) 12.5 MG tablet TAKE 1 TABLET(12.5 MG) BY MOUTH TWICE DAILY   Cholecalciferol (VITAMIN D3) 50 MCG (2000 UT) TABS Take 1 tablet (50 mcg total) by mouth daily.   cyclobenzaprine (FLEXERIL) 5 MG tablet Take 1 tablet (5 mg total) by mouth 3 (three) times daily as needed for muscle spasms.   fexofenadine (ALLEGRA) 180 MG tablet Take 1 tablet (180 mg total) by mouth daily.   fluticasone (FLONASE) 50 MCG/ACT nasal spray Place 1 spray into both nostrils daily.   gabapentin (NEURONTIN) 300 MG capsule TAKE ONE CAPSULE BY MOUTH IN THE MORNING AND 2 CAPSULES IN THE EVENING   metFORMIN (GLUCOPHAGE) 500 MG tablet TAKE 1 TABLET(500 MG) BY MOUTH TWICE DAILY WITH A MEAL   metoCLOPramide (REGLAN) 5 MG tablet Take 1 tablet (5 mg total) by mouth once a week. TAKE WEEKLY AS NEEDED FOR ACID REFLUX   omeprazole (PRILOSEC) 40 MG capsule Take 1 capsule (40 mg total) by mouth daily.   valsartan (DIOVAN) 80 MG tablet TAKE 1 TABLET BY MOUTH DAILY   No facility-administered encounter medications on file as of 07/02/2023.    REVIEW OF SYSTEMS  : All other systems reviewed and negative except where noted in the History of Present Illness.   PHYSICAL EXAM: BP 110/62   Pulse 71   Ht 5\' 6"  (1.676 m)   Wt 195 lb (88.5 kg)   BMI 31.47 kg/m  General: Well developed white male in no acute distress Head: Normocephalic and atraumatic Eyes:  Sclerae anicteric, conjunctiva pink. Ears: Normal auditory acuity  Lungs: Clear throughout to auscultation; no WR/R. Heart: Regular rate and rhythm; no M/R/G. Abdomen: Soft, non-distended.  BS present.  Mild epigastric TTP. Rectal:  Will be done at the time of colonoscopy. Musculoskeletal: Symmetrical with no gross deformities  Skin: No lesions on visible extremities Extremities: No edema  Neurological: Alert oriented x 4, grossly  non-focal Psychological:  Alert and cooperative. Normal mood and affect  ASSESSMENT AND PLAN: *CRC screening:  Normal colonoscopy 03/2008.  Will schedule with Dr. Myrtie Neither. *GERD:  Having a lot of reflux despite omeprazole 40 mg daily.  Uses Reglan on occasion.  Will schedule EGD with Dr. Myrtie Neither as well.  May need medication changed or increased depending on results of EGD. *RUQ and epigastric pain:  S/p cholecystectomy.  Has history of pancreatitis 2018/2019.  Has had a couple of episodes of epigastric pain and goes on a liquid diet for a few days.  RUQ abdominal pain is more nagging and more frequent.  ? If that could be scar tissue.  Could consider CT abdomen with contrast as well.  **The risks, benefits, and alternatives to EGD and colonoscopy were discussed with the patient and he consents to proceed.   CC:  [place]oss Gauss, [place]

## 2023-07-03 NOTE — Progress Notes (Signed)
____________________________________________________________  Attending physician addendum:  Thank you for sending this case to me. I have reviewed the entire note and agree with the plan.   Malyssa Maris Danis, MD  ____________________________________________________________  

## 2023-07-06 ENCOUNTER — Ambulatory Visit: Payer: BC Managed Care – PPO | Admitting: Gastroenterology

## 2023-07-30 ENCOUNTER — Encounter: Payer: BC Managed Care – PPO | Admitting: Gastroenterology

## 2023-08-29 ENCOUNTER — Telehealth: Payer: Self-pay | Admitting: Gastroenterology

## 2023-08-29 NOTE — Telephone Encounter (Signed)
 PT broke phone so please call [phone]

## 2023-08-29 NOTE — Telephone Encounter (Signed)
 Called patient at new number provided. Phone rang and no voicemail available.

## 2023-08-29 NOTE — Telephone Encounter (Signed)
 PT would like to discuss the prep instructions. Please advise.

## 2023-08-30 NOTE — Telephone Encounter (Signed)
Patient called and stated that he was return a call regarding his prep instruction. Patient stated that a good call back number for him would be [phone]. Please advise.

## 2023-08-30 NOTE — Telephone Encounter (Signed)
Called patient at number provided. Phone rang and no voicemail. We try again.

## 2023-08-31 NOTE — Telephone Encounter (Signed)
Spoke with patient and he lost his instructions and needed a new copy. Emailed patient a blank instructions to him.

## 2023-09-03 ENCOUNTER — Other Ambulatory Visit: Payer: Self-pay | Admitting: Physician Assistant

## 2023-09-03 ENCOUNTER — Encounter: Payer: Self-pay | Admitting: Gastroenterology

## 2023-09-03 ENCOUNTER — Ambulatory Visit: Payer: 59 | Admitting: Gastroenterology

## 2023-09-03 VITALS — BP 123/74 | HR 74 | Temp 97.4°F | Resp 15 | Ht 66.0 in | Wt 195.0 lb

## 2023-09-03 DIAGNOSIS — K219 Gastro-esophageal reflux disease without esophagitis: Secondary | ICD-10-CM

## 2023-09-03 DIAGNOSIS — K648 Other hemorrhoids: Secondary | ICD-10-CM | POA: Diagnosis not present

## 2023-09-03 DIAGNOSIS — E7800 Pure hypercholesterolemia, unspecified: Secondary | ICD-10-CM

## 2023-09-03 DIAGNOSIS — K5730 Diverticulosis of large intestine without perforation or abscess without bleeding: Secondary | ICD-10-CM

## 2023-09-03 DIAGNOSIS — K6289 Other specified diseases of anus and rectum: Secondary | ICD-10-CM

## 2023-09-03 DIAGNOSIS — Z1211 Encounter for screening for malignant neoplasm of colon: Secondary | ICD-10-CM | POA: Diagnosis present

## 2023-09-03 DIAGNOSIS — K317 Polyp of stomach and duodenum: Secondary | ICD-10-CM

## 2023-09-03 MED ORDER — SODIUM CHLORIDE 0.9 % IV SOLN: 500.0000 mL | Freq: Once | INTRAVENOUS | Status: DC

## 2023-09-03 MED ORDER — OMEPRAZOLE 40 MG PO CPDR: DELAYED_RELEASE_CAPSULE | ORAL | 3 refills | Status: DC

## 2023-09-03 NOTE — Progress Notes (Signed)
History and Physical:  This patient presents for endoscopic testing for: Encounter Diagnoses  Name Primary?   Screening for colon cancer Yes   Gastroesophageal reflux disease, unspecified whether esophagitis present     Clinical details in 07/02/23 office consult note. Average risk for CRC.  Prior Dx colonoscopy for diarrhea in 2019 Chronic GERD symptoms persisting despite regular use of acid suppression medicine. Gastric mucosal Bx in 2019 negative for H pylori  Patient is otherwise without complaints or active issues today.   Past Medical History: Past Medical History:  Diagnosis Date   Anxiety    Arrhythmia    Arthritis    Asthma    Bile leak    Celiac disease    GERD (gastroesophageal reflux disease)    Gluten intolerance    Headache    Hearing loss    Hiatal hernia    Hx of acute pancreatitis 05/09/2017   Hyperlipidemia    Hypertension    Intra-abdominal fluid collection 06/20/2018   Memory loss    Pancreatitis    Pneumonia    hx of    Vision abnormalities      Past Surgical History: Past Surgical History:  Procedure Laterality Date   BILIARY STENT PLACEMENT  06/23/2018   Procedure: BILIARY STENT PLACEMENT;  Surgeon: Kerin Salen, MD;  Location: [facility] ENDOSCOPY;  Service: Gastroenterology;;   COLONOSCOPY     ERCP N/A 06/23/2018   Procedure: ENDOSCOPIC RETROGRADE CHOLANGIOPANCREATOGRAPHY (ERCP);  Surgeon: Kerin Salen, MD;  Location: [facility] ENDOSCOPY;  Service: Gastroenterology;  Laterality: N/A;   ESOPHAGOGASTRODUODENOSCOPY (EGD) WITH PROPOFOL N/A 07/23/2018   Procedure: ESOPHAGOGASTRODUODENOSCOPY (EGD) WITH PROPOFOL;  Surgeon: Sherrilyn Rist, MD;  Location: [facility] ENDOSCOPY;  Service: Gastroenterology;  Laterality: N/A;   LAPAROSCOPIC CHOLECYSTECTOMY SINGLE SITE WITH INTRAOPERATIVE CHOLANGIOGRAM N/A 06/13/2018   Procedure: LAPAROSCOPIC CHOLECYSTECTOMY SINGLE SITE WITH INTRAOPERATIVE CHOLANGIOGRAM ERAS PATHWAY;  Surgeon: Karie Soda, MD;  Location: [facility] ORS;  Service:  General;  Laterality: N/A;   SPHINCTEROTOMY  06/23/2018   Procedure: Dennison Mascot;  Surgeon: Kerin Salen, MD;  Location: [facility] ENDOSCOPY;  Service: Gastroenterology;;   STENT REMOVAL  07/23/2018   Procedure: STENT REMOVAL;  Surgeon: Sherrilyn Rist, MD;  Location: [facility] ENDOSCOPY;  Service: Gastroenterology;;   UMBILICAL HERNIA REPAIR N/A 06/13/2018   Procedure: LAPAROSCOPIC POSSIBLE OPEN UMBILICAL HERNIA;  Surgeon: Karie Soda, MD;  Location: [facility] ORS;  Service: General;  Laterality: N/A;   UPPER GASTROINTESTINAL ENDOSCOPY     WISDOM TOOTH EXTRACTION      Allergies: Allergies  Allergen Reactions   Egg-Derived Products Diarrhea   Gluten Meal Nausea And Vomiting   Latex Rash    Occurs after long use of latex    Outpatient Meds: Current Outpatient Medications  Medication Sig Dispense Refill   atorvastatin (LIPITOR) 10 MG tablet TAKE 1 TABLET(10 MG) BY MOUTH DAILY 30 tablet 1   carvedilol (COREG) 12.5 MG tablet TAKE 1 TABLET(12.5 MG) BY MOUTH TWICE DAILY 180 tablet 0   Cholecalciferol (VITAMIN D3) 50 MCG (2000 UT) TABS Take 1 tablet (50 mcg total) by mouth daily. 90 tablet 3   fexofenadine (ALLEGRA) 180 MG tablet Take 1 tablet (180 mg total) by mouth daily. 90 tablet 3   gabapentin (NEURONTIN) 300 MG capsule TAKE ONE CAPSULE BY MOUTH IN THE MORNING AND 2 CAPSULES IN THE EVENING 270 capsule 1   metFORMIN (GLUCOPHAGE) 500 MG tablet TAKE 1 TABLET(500 MG) BY MOUTH TWICE DAILY WITH A MEAL 180 tablet 3   metoCLOPramide (REGLAN) 5 MG tablet Take 1  tablet (5 mg total) by mouth once a week. TAKE WEEKLY AS NEEDED FOR ACID REFLUX 30 tablet 3   omeprazole (PRILOSEC) 40 MG capsule Take 1 capsule (40 mg total) by mouth daily. 90 capsule 3   valsartan (DIOVAN) 80 MG tablet TAKE 1 TABLET BY MOUTH DAILY 90 tablet 3   albuterol (VENTOLIN HFA) 108 (90 Base) MCG/ACT inhaler Inhale 2 puffs into the lungs every 6 (six) hours as needed for wheezing or shortness of breath. 18 g 3   cyclobenzaprine (FLEXERIL) 5  MG tablet Take 1 tablet (5 mg total) by mouth 3 (three) times daily as needed for muscle spasms. 90 tablet 1   fluticasone (FLONASE) 50 MCG/ACT nasal spray Place 1 spray into both nostrils daily. 11.1 mL 6   Current Facility-Administered Medications  Medication Dose Route Frequency Provider Last Rate Last Admin   0.9 %  sodium chloride infusion  500 mL Intravenous Once Charlie Pitter III, MD          ___________________________________________________________________ Objective   Exam:  BP 130/83   Pulse 69   Temp (!) 97.4 F (36.3 C) (Temporal)   Ht 5\' 6"  (1.676 m)   Wt 195 lb (88.5 kg)   SpO2 95%   BMI 31.47 kg/m   CV: regular , S1/S2 Resp: clear to auscultation bilaterally, normal RR and effort noted GI: soft, no tenderness, with active bowel sounds.   Assessment: Encounter Diagnoses  Name Primary?   Screening for colon cancer Yes   Gastroesophageal reflux disease, unspecified whether esophagitis present      Plan: Colonoscopy EGD  The benefits and risks of the planned procedure were described in detail with the patient or (when appropriate) their health care proxy.  Risks were outlined as including, but not limited to, bleeding, infection, perforation, adverse medication reaction leading to cardiac or pulmonary decompensation, pancreatitis (if ERCP).  The limitation of incomplete mucosal visualization was also discussed.  No guarantees or warranties were given.  The patient is appropriate for an endoscopic procedure in the ambulatory setting.   - Amada Jupiter, MD

## 2023-09-03 NOTE — Op Note (Signed)
[facility] Patient Name: Gary Weaver Procedure Date: 09/03/2023 11:21 AM MRN: [id number] Endoscopist: Sherilyn Cooter L. Myrtie Neither , MD, [id number] Age: 58 Referring MD:  Date of Birth: 06/06/1966 Gender: Male Account #: [id number] Procedure:                Colonoscopy Indications:              Screening for colorectal malignant neoplasm Medicines:                Monitored Anesthesia Care Procedure:                Pre-Anesthesia Assessment:                           - Prior to the procedure, a History and Physical                            was performed, and patient medications and                            allergies were reviewed. The patient's tolerance of                            previous anesthesia was also reviewed. The risks                            and benefits of the procedure and the sedation                            options and risks were discussed with the patient.                            All questions were answered, and informed consent                            was obtained. Prior Anticoagulants: The patient has                            taken no anticoagulant or antiplatelet agents. ASA                            Grade Assessment: II - A patient with mild systemic                            disease. After reviewing the risks and benefits,                            the patient was deemed in satisfactory condition to                            undergo the procedure.                           After obtaining informed consent, the colonoscope  was passed under direct vision. Throughout the                            procedure, the patient's blood pressure, pulse, and                            oxygen saturations were monitored continuously. The                            CF [id number] #[id number] was introduced through the anus                            and advanced to the the cecum, identified by                            appendiceal  orifice and ileocecal valve. The                            colonoscopy was performed without difficulty. The                            patient tolerated the procedure well. The quality                            of the bowel preparation was excellent. The                            ileocecal valve, appendiceal orifice, and rectum                            were photographed. Scope In: 11:37:42 AM Scope Out: 11:50:45 AM Scope Withdrawal Time: 0 hours 8 minutes 51 seconds  Total Procedure Duration: 0 hours 13 minutes 3 seconds  Findings:                 The perianal and digital rectal examinations were                            normal.                           Repeat examination of right colon under NBI                            performed.                           Multiple diverticula were found in the left colon.                           Anal papilla(e) were hypertrophied.                           Internal hemorrhoids were found. The hemorrhoids  were small.                           The exam was otherwise without abnormality on                            direct and retroflexion views. Complications:            No immediate complications. Estimated Blood Loss:     Estimated blood loss: none. Impression:               - Diverticulosis in the left colon.                           - Anal papilla(e) were hypertrophied.                           - Internal hemorrhoids.                           - The examination was otherwise normal on direct                            and retroflexion views.                           - No specimens collected. Recommendation:           - Patient has a contact number available for                            emergencies. The signs and symptoms of potential                            delayed complications were discussed with the                            patient. Return to normal activities tomorrow.                             Written discharge instructions were provided to the                            patient.                           - Resume previous diet.                           - Continue present medications.                           - Repeat colonoscopy in 10 years for screening                            purposes.                           - See the  other procedure note for documentation of                            additional recommendations. Ashira Kirsten L. Myrtie Neither, MD 09/03/2023 12:01:53 PM This report has been signed electronically.

## 2023-09-03 NOTE — Op Note (Signed)
[facility] Patient Name: Gary Weaver Procedure Date: 09/03/2023 11:15 AM MRN: [id number] Endoscopist: Sherilyn Cooter L. Myrtie Neither , MD, [id number] Age: 58 Referring MD:  Date of Birth: 1965-10-08 Gender: Male Account #: [id number] Procedure:                Upper GI endoscopy Indications:              Esophageal reflux symptoms that persist despite                            appropriate therapy Medicines:                Monitored Anesthesia Care Procedure:                Pre-Anesthesia Assessment:                           - Prior to the procedure, a History and Physical                            was performed, and patient medications and                            allergies were reviewed. The patient's tolerance of                            previous anesthesia was also reviewed. The risks                            and benefits of the procedure and the sedation                            options and risks were discussed with the patient.                            All questions were answered, and informed consent                            was obtained. Prior Anticoagulants: The patient has                            taken no anticoagulant or antiplatelet agents. ASA                            Grade Assessment: II - A patient with mild systemic                            disease. After reviewing the risks and benefits,                            the patient was deemed in satisfactory condition to                            undergo the procedure.  After obtaining informed consent, the endoscope was                            passed under direct vision. Throughout the                            procedure, the patient's blood pressure, pulse, and                            oxygen saturations were monitored continuously. The                            GIF [id number] #[id number] was introduced through the                            mouth, and advanced to the second part  of duodenum.                            The upper GI endoscopy was accomplished without                            difficulty. The patient tolerated the procedure                            well. Scope In: Scope Out: Findings:                 The esophagus was normal.                           A single sessile fundic gland polyp was found on                            the greater curvature of the gastric body.                           The exam of the stomach was otherwise normal.                            Distends well with insufflation.                           The cardia and gastric fundus were normal on                            retroflexion.                           The examined duodenum was normal. Complications:            No immediate complications. Estimated Blood Loss:     Estimated blood loss: none. Impression:               - Normal esophagus.                           - A single fundic gland  polyp.                           - Normal examined duodenum.                           - No specimens collected. Recommendation:           - Patient has a contact number available for                            emergencies. The signs and symptoms of potential                            delayed complications were discussed with the                            patient. Return to normal activities tomorrow.                            Written discharge instructions were provided to the                            patient.                           - Resume previous diet.                           - Continue present medications. Increase omeprazole                            40 mg from once daily to twice daily. (Disp #60, RF                            3)                           - Follow an antireflux regimen indefinitely. These                            things are at least as important as medicine to                            help control GERD symptoms.                           -  Return to physician assistant (Zehr) at                            appointment to be scheduled for ongoing management                            of GERD. Elasia Furnish L. Myrtie Neither, MD 09/03/2023 11:59:07 AM This report has been signed electronically.

## 2023-09-03 NOTE — Progress Notes (Signed)
Sedate, gd SR, tolerated procedure well, VSS, report to RN 

## 2023-09-03 NOTE — Patient Instructions (Signed)
Educational handout provided to patient related to heartburn, hiatal hernia, & GERD  Resume previous diet  Continue present medications  Repeat colonoscopy on 10 years for screening purposes.   YOU HAD AN ENDOSCOPIC PROCEDURE TODAY AT THE [facility]:   Refer to the procedure report that was given to you for any specific questions about what was found during the examination.  If the procedure report does not answer your questions, please call your gastroenterologist to clarify.  If you requested that your care partner not be given the details of your procedure findings, then the procedure report has been included in a sealed envelope for you to review at your convenience later.  YOU SHOULD EXPECT: Some feelings of bloating in the abdomen. Passage of more gas than usual.  Walking can help get rid of the air that was put into your GI tract during the procedure and reduce the bloating. If you had a lower endoscopy (such as a colonoscopy or flexible sigmoidoscopy) you may notice spotting of blood in your stool or on the toilet paper. If you underwent a bowel prep for your procedure, you may not have a normal bowel movement for a few days.  Please Note:  You might notice some irritation and congestion in your nose or some drainage.  This is from the oxygen used during your procedure.  There is no need for concern and it should clear up in a day or so.  SYMPTOMS TO REPORT IMMEDIATELY:  Following lower endoscopy (colonoscopy or flexible sigmoidoscopy):  Excessive amounts of blood in the stool  Significant tenderness or worsening of abdominal pains  Swelling of the abdomen that is new, acute  Fever of 100F or higher  Following upper endoscopy (EGD)  Vomiting of blood or coffee ground material  New chest pain or pain under the shoulder blades  Painful or persistently difficult swallowing  New shortness of breath  Fever of 100F or higher  Black, tarry-looking stools  For urgent or  emergent issues, a gastroenterologist can be reached at any hour by calling [phone]. Do not use MyChart messaging for urgent concerns.    DIET:  We do recommend a small meal at first, but then you may proceed to your regular diet.  Drink plenty of fluids but you should avoid alcoholic beverages for 24 hours.  ACTIVITY:  You should plan to take it easy for the rest of today and you should NOT DRIVE or use heavy machinery until tomorrow (because of the sedation medicines used during the test).    FOLLOW UP: Our staff will call the number listed on your records the next business day following your procedure.  We will call around 7:15- 8:00 am to check on you and address any questions or concerns that you may have regarding the information given to you following your procedure. If we do not reach you, we will leave a message.     If any biopsies were taken you will be contacted by phone or by letter within the next 1-3 weeks.  Please call us at [phone] if you have not heard about the biopsies in 3 weeks.    SIGNATURES/CONFIDENTIALITY: You and/or your care partner have signed paperwork which will be entered into your electronic medical record.  These signatures attest to the fact that that the information above on your After Visit Summary has been reviewed and is understood.  Full responsibility of the confidentiality of this discharge information lies with you and/or your care-partner.

## 2023-09-04 ENCOUNTER — Telehealth: Payer: Self-pay

## 2023-09-04 NOTE — Telephone Encounter (Signed)
Left message on follow up call. 

## 2023-10-01 NOTE — Progress Notes (Unsigned)
 Subjective:  Patient ID: Gary Weaver, male    DOB: Sep 19, 1965  Age: 58 y.o. MRN: [id number]  Chief Complaint  Patient presents with   Medical Management of Chronic Issues    HPI   Pt presents for follow-up of chronic medical problems of HTN, Prediabetes, and GERD.   Prediabetes: Metformin 500 mg twice daily  Hyperlipidemia: Current medications: No medications, controlled through diet and exercise. Will continue to do that unless labs come back severely abnormal   Hypertension: Complications: None reported at this time Current medications: coreg and valsartan. Followed by Cardiology, Dr Servando Salina   GERD: Prilosec and Reglan.  Chronic Back pain: Flexeril 5 mg 3 times a day as needed, and gabapentin 300 mg 1 in the morning 2 at bedtime daily  Diet: regular Exercise: No, work everyday  Discussed the use of AI scribe software for clinical note transcription with the patient, who gave verbal consent to proceed.  History of Present Illness   The patient, with a history of celiac disease, arthritis, back pain, and elevated blood sugars, presents for a routine follow-up and medication refill. He reports no new issues with his current medications, including metformin and a statin. He has been adhering to a gluten-free diet due to celiac disease and has been watching his sugar intake. He reports feeling better since starting metformin about a year ago.  The patient also reports a dull ache or pressure feeling in the chest, which he describes as a "funny feeling." He notes that it feels like a restrictive pressure and is relieved by aspirin. He plans to schedule an appointment with his cardiologist to further evaluate this symptom.  The patient also mentions a recent colonoscopy and endoscopy, where a polyp was found in the stomach. He has been prescribed antireflux medication and has been adhering to the treatment.          03/27/2023   10:16 AM 05/22/2022    1:41 PM 10/25/2021    10:53 AM 11/25/2019    9:04 AM  Depression screen PHQ 2/9  Decreased Interest 0 0 0 0  Down, Depressed, Hopeless 0 0 0 0  PHQ - 2 Score 0 0 0 0  Altered sleeping 2 0    Tired, decreased energy 2 0    Change in appetite 0 0    Feeling bad or failure about yourself  0 0    Trouble concentrating 0 0    Moving slowly or fidgety/restless 0 0    Suicidal thoughts 0 0    PHQ-9 Score 4 0    Difficult doing work/chores Not difficult at all Not difficult at all          03/27/2023   10:16 AM  Fall Risk   Falls in the past year? 0  Number falls in past yr: 0  Injury with Fall? 0  Risk for fall due to : No Fall Risks  Follow up Falls evaluation completed    Patient Care Team: Bonawitz Gauss, [place] as PCP - General (Physician Assistant) Thomasene Ripple, DO as PCP - Cardiology (Cardiology) Karie Soda, MD as Consulting Physician (General Surgery) Danis, Andreas Blower, MD as Consulting Physician (Gastroenterology) Dot Been Salvatore Decent., MD as Consulting Physician (Cardiology)   Review of Systems  Constitutional:  Negative for chills, diaphoresis, fatigue and fever.  HENT:  Negative for congestion, ear pain and sore throat.   Respiratory:  Negative for cough and shortness of breath.   Cardiovascular:  Negative for chest pain and  leg swelling.  Gastrointestinal:  Negative for abdominal pain, constipation, diarrhea, nausea and vomiting.  Genitourinary:  Negative for dysuria and urgency.  Musculoskeletal:  Positive for back pain. Negative for arthralgias and myalgias.  Neurological:  Negative for dizziness and headaches.  Psychiatric/Behavioral:  Negative for dysphoric mood.     No current outpatient medications on file prior to visit.   No current facility-administered medications on file prior to visit.   Past Medical History:  Diagnosis Date   Anxiety    Arrhythmia    Arthritis    Asthma    Bile leak    Celiac disease    GERD (gastroesophageal reflux disease)    Gluten intolerance     Headache    Hearing loss    Hiatal hernia    Hx of acute pancreatitis 05/09/2017   Hyperlipidemia    Hypertension    Intra-abdominal fluid collection 06/20/2018   Memory loss    Pancreatitis    Pneumonia    hx of    Vision abnormalities    Past Surgical History:  Procedure Laterality Date   BILIARY STENT PLACEMENT  06/23/2018   Procedure: BILIARY STENT PLACEMENT;  Surgeon: Kerin Salen, MD;  Location: [facility] ENDOSCOPY;  Service: Gastroenterology;;   COLONOSCOPY     ERCP N/A 06/23/2018   Procedure: ENDOSCOPIC RETROGRADE CHOLANGIOPANCREATOGRAPHY (ERCP);  Surgeon: Kerin Salen, MD;  Location: [facility] ENDOSCOPY;  Service: Gastroenterology;  Laterality: N/A;   ESOPHAGOGASTRODUODENOSCOPY (EGD) WITH PROPOFOL N/A 07/23/2018   Procedure: ESOPHAGOGASTRODUODENOSCOPY (EGD) WITH PROPOFOL;  Surgeon: Sherrilyn Rist, MD;  Location: [facility] ENDOSCOPY;  Service: Gastroenterology;  Laterality: N/A;   LAPAROSCOPIC CHOLECYSTECTOMY SINGLE SITE WITH INTRAOPERATIVE CHOLANGIOGRAM N/A 06/13/2018   Procedure: LAPAROSCOPIC CHOLECYSTECTOMY SINGLE SITE WITH INTRAOPERATIVE CHOLANGIOGRAM ERAS PATHWAY;  Surgeon: Karie Soda, MD;  Location: [facility] ORS;  Service: General;  Laterality: N/A;   SPHINCTEROTOMY  06/23/2018   Procedure: SPHINCTEROTOMY;  Surgeon: Kerin Salen, MD;  Location: [facility] ENDOSCOPY;  Service: Gastroenterology;;   STENT REMOVAL  07/23/2018   Procedure: STENT REMOVAL;  Surgeon: Sherrilyn Rist, MD;  Location: [facility] ENDOSCOPY;  Service: Gastroenterology;;   UMBILICAL HERNIA REPAIR N/A 06/13/2018   Procedure: LAPAROSCOPIC POSSIBLE OPEN UMBILICAL HERNIA;  Surgeon: Karie Soda, MD;  Location: [facility] ORS;  Service: General;  Laterality: N/A;   UPPER GASTROINTESTINAL ENDOSCOPY     WISDOM TOOTH EXTRACTION      Family History  Problem Relation Age of Onset   Hypertension Father    Prostate cancer Father    Colon polyps Father    Bladder Cancer Mother    Colon cancer Maternal Grandmother    Esophageal cancer Neg Hx     Rectal cancer Neg Hx    Stomach cancer Neg Hx    Pancreatic cancer Neg Hx    Social History   Socioeconomic History   Marital status: Married    Spouse name: Not on file   Number of children: 3   Years of education: Not on file   Highest education level: Not on file  Occupational History   Occupation: Surveyor, 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  Tobacco Use   Smoking status: Never   Smokeless tobacco: Current    Types: Chew   Tobacco comments:    ocasionally  Vaping Use   Vaping status: Never Used  Substance and Sexual Activity   Alcohol use: Yes    Alcohol/week: 2.0 standard drinks of alcohol    Types: 2 Standard drinks or equivalent per week    Comment: ocassionally   Drug  use: No   Sexual activity: Yes    Partners: Female  Other Topics Concern   Not on file  Social History Narrative   Not on file   Social Drivers of Health   Financial Resource Strain: Low Risk  (03/27/2023)   Overall Financial Resource Strain (CARDIA)    Difficulty of Paying Living Expenses: Not hard at all  Food Insecurity: No Food Insecurity (03/27/2023)   Hunger Vital Sign    Worried About Running Out of Food in the Last Year: Never true    Ran Out of Food in the Last Year: Never true  Transportation Needs: No Transportation Needs (03/27/2023)   PRAPARE - Administrator, Civil Service (Medical): No    Lack of Transportation (Non-Medical): No  Physical Activity: Inactive (03/27/2023)   Exercise Vital Sign    Days of Exercise per Week: 0 days    Minutes of Exercise per Session: 0 min  Stress: No Stress Concern Present (03/27/2023)   [employer] of Occupational Health - Occupational Stress Questionnaire    Feeling of Stress : Not at all  Social Connections: Moderately Integrated (03/27/2023)   Social Connection and Isolation Panel [NHANES]    Frequency of Communication with Friends and Family: More than three times a week    Frequency of Social Gatherings with Friends and Family: More  than three times a week    Attends Religious Services: More than 4 times per year    Active Member of [employer] or Organizations: No    Attends Engineer, structural: Never    Marital Status: Married    Objective:  BP 124/78   Pulse 81   Temp 97.6 F (36.4 C)   Ht 5\' 6"  (1.676 m)   Wt 193 lb (87.5 kg)   SpO2 97%   BMI 31.15 kg/m      10/02/2023    8:02 AM 09/03/2023   12:10 PM 09/03/2023   12:02 PM  BP/Weight  Systolic BP 124 123 106  Diastolic BP 78 74 61  Wt. (Lbs) 193    BMI 31.15 kg/m2      Physical Exam Vitals reviewed.  Constitutional:      Appearance: Normal appearance.  Cardiovascular:     Rate and Rhythm: Normal rate and regular rhythm.     Heart sounds: Normal heart sounds.  Pulmonary:     Effort: Pulmonary effort is normal.     Breath sounds: Normal breath sounds.  Abdominal:     General: Bowel sounds are normal.     Palpations: Abdomen is soft.     Tenderness: There is no abdominal tenderness.  Neurological:     Mental Status: He is alert and oriented to person, place, and time.  Psychiatric:        Mood and Affect: Mood normal.        Behavior: Behavior normal.     Diabetic Foot Exam - Simple   No data filed      Lab Results  Component Value Date   WBC 6.3 03/27/2023   HGB 14.9 03/27/2023   HCT 44.7 03/27/2023   PLT 298 03/27/2023   GLUCOSE 119 (H) 03/27/2023   CHOL 197 03/27/2023   TRIG 89 03/27/2023   HDL 54 03/27/2023   LDLCALC 127 (H) 03/27/2023   ALT 20 03/27/2023   AST 17 03/27/2023   NA 141 03/27/2023   K 5.1 03/27/2023   CL 104 03/27/2023   CREATININE 1.32 (H) 03/27/2023   BUN  13 03/27/2023   CO2 22 03/27/2023   TSH 1.350 11/14/2016   INR 1.13 06/21/2018   HGBA1C 6.1 (H) 03/27/2023      Assessment & Plan:    Primary hypertension Assessment & Plan: Well controlled.  Continue to work on eating a healthy diet and exercise.  Labs drawn today.   No major side effects reported, and no issues with  compliance. The current medical regimen is effective;  continue present plan with Diovan 80mg , Coreg 12.5mg  Will adjust medication as needed depending on labs BP Readings from Last 3 Encounters:  10/02/23 124/78  09/03/23 123/74  07/02/23 110/62     Orders: -     CBC with Differential/Platelet -     Comprehensive metabolic panel -     Carvedilol; TAKE 1 TABLET(12.5 MG) BY MOUTH TWICE DAILY  Dispense: 180 tablet; Refill: 0 -     Valsartan; Take 1 tablet by mouth daily  Dispense: 90 tablet; Refill: 3  Prediabetes Assessment & Plan: Improved symptoms since starting Metformin and dietary modifications. -Continue Metformin. -Check HbA1c today.  Orders: -     Hemoglobin A1c -     metFORMIN HCl; TAKE 1 TABLET(500 MG) BY MOUTH TWICE DAILY WITH A MEAL  Dispense: 180 tablet; Refill: 3  Gastroesophageal reflux disease, unspecified whether esophagitis present Assessment & Plan: Controlled Recently adjust by GI due to findings on EGD and colonoscopy Will have repeat in 10 years  Orders: -     Metoclopramide HCl; Take 1 tablet (5 mg total) by mouth once a week. TAKE WEEKLY AS NEEDED FOR ACID REFLUX  Dispense: 30 tablet; Refill: 3 -     Omeprazole; Increase omeprazole 40mg  from once daily to twice daily.  Dispense: 60 capsule; Refill: 3  Elevated LDL cholesterol level -     Lipid panel -     Atorvastatin Calcium; Take 1 tablet (10 mg total) by mouth daily.  Dispense: 90 tablet; Refill: 1  Seasonal allergic rhinitis due to pollen Assessment & Plan: Controlled Continue to monitor for for any worsening or changing symptoms Will adjust treatment depending on symptoms  Orders: -     Albuterol Sulfate HFA; Inhale 2 puffs into the lungs every 6 (six) hours as needed for wheezing or shortness of breath.  Dispense: 18 g; Refill: 3 -     Fexofenadine HCl; Take 1 tablet (180 mg total) by mouth daily.  Dispense: 90 tablet; Refill: 3 -     Fluticasone Propionate; Place 1 spray into both nostrils  daily.  Dispense: 11.1 mL; Refill: 6  Muscle spasm -     Cyclobenzaprine HCl; Take 1 tablet (5 mg total) by mouth 3 (three) times daily as needed for muscle spasms.  Dispense: 90 tablet; Refill: 1  Radiculopathy, lumbar region -     Gabapentin; TAKE ONE CAPSULE BY MOUTH IN THE MORNING AND 2 CAPSULES IN THE EVENING  Dispense: 270 capsule; Refill: 1  Encounter for immunization -     Influenza, MDCK, trivalent, PF(Flucelvax egg-free)  Paroxysmal tachycardia (HCC) Assessment & Plan: Reports of occasional dull ache and pressure sensation in the chest, possibly related to blood pressure. No symptoms of racing heart. -Recommend follow-up with cardiologist.   Vitamin D deficiency -     VITAMIN D 25 Hydroxy (Vit-D Deficiency, Fractures)  Celiac disease Assessment & Plan: Maintaining a gluten-free diet with no new symptoms. -Continue gluten-free diet.   Mixed hyperlipidemia Assessment & Plan: No new symptoms or side effects from statin therapy. -Continue current statin  therapy. -Check lipid panel today.   Diverticulosis of colon Assessment & Plan: No symptoms of diverticulitis. Maintaining a high fiber diet due to celiac disease. -Continue high fiber diet.   Other orders -     Vitamin D3; Take 1 tablet (50 mcg total) by mouth daily.  Dispense: 90 tablet; Refill: 3     Meds ordered this encounter  Medications   albuterol (VENTOLIN HFA) 108 (90 Base) MCG/ACT inhaler    Sig: Inhale 2 puffs into the lungs every 6 (six) hours as needed for wheezing or shortness of breath.    Dispense:  18 g    Refill:  3   atorvastatin (LIPITOR) 10 MG tablet    Sig: Take 1 tablet (10 mg total) by mouth daily.    Dispense:  90 tablet    Refill:  1   carvedilol (COREG) 12.5 MG tablet    Sig: TAKE 1 TABLET(12.5 MG) BY MOUTH TWICE DAILY    Dispense:  180 tablet    Refill:  0   Cholecalciferol (VITAMIN D3) 50 MCG (2000 UT) TABS    Sig: Take 1 tablet (50 mcg total) by mouth daily.    Dispense:   90 tablet    Refill:  3   cyclobenzaprine (FLEXERIL) 5 MG tablet    Sig: Take 1 tablet (5 mg total) by mouth 3 (three) times daily as needed for muscle spasms.    Dispense:  90 tablet    Refill:  1   fexofenadine (ALLEGRA) 180 MG tablet    Sig: Take 1 tablet (180 mg total) by mouth daily.    Dispense:  90 tablet    Refill:  3   fluticasone (FLONASE) 50 MCG/ACT nasal spray    Sig: Place 1 spray into both nostrils daily.    Dispense:  11.1 mL    Refill:  6   gabapentin (NEURONTIN) 300 MG capsule    Sig: TAKE ONE CAPSULE BY MOUTH IN THE MORNING AND 2 CAPSULES IN THE EVENING    Dispense:  270 capsule    Refill:  1    Patient needs an appointment   metFORMIN (GLUCOPHAGE) 500 MG tablet    Sig: TAKE 1 TABLET(500 MG) BY MOUTH TWICE DAILY WITH A MEAL    Dispense:  180 tablet    Refill:  3    Patient needs an appointment   metoCLOPramide (REGLAN) 5 MG tablet    Sig: Take 1 tablet (5 mg total) by mouth once a week. TAKE WEEKLY AS NEEDED FOR ACID REFLUX    Dispense:  30 tablet    Refill:  3   omeprazole (PRILOSEC) 40 MG capsule    Sig: Increase omeprazole 40mg  from once daily to twice daily.    Dispense:  60 capsule    Refill:  3    Patient needs an appointment.   valsartan (DIOVAN) 80 MG tablet    Sig: Take 1 tablet by mouth daily    Dispense:  90 tablet    Refill:  3    **Patient requests 90 days supply**    Orders Placed This Encounter  Procedures   Influenza, MDCK, trivalent, PF(Flucelvax egg-free)   CBC with Differential/Platelet   Comprehensive metabolic panel   Hemoglobin A1c   Lipid panel   VITAMIN D 25 Hydroxy (Vit-D Deficiency, Fractures)    General Health Maintenance -Continue current medications. -Check complete blood count and liver function tests today. -Plan for follow-up in 6 months, or sooner if lab results are significantly  abnormal.        Follow-up: Return in about 6 months (around 03/31/2024) for Chronic, Huston Foley.   I,Marla I Leal-Borjas,acting as a  scribe for [employer], PA.,have documented all relevant documentation on the behalf of Jeanlouis Gauss, PA,as directed by  Reither Gauss, PA while in the presence of Tozzi Gauss, [place].   An After Visit Summary was printed and given to the patient.  Furey Gauss, [place] [facility] [phone]

## 2023-10-02 ENCOUNTER — Encounter: Payer: Self-pay | Admitting: Physician Assistant

## 2023-10-02 ENCOUNTER — Ambulatory Visit: Payer: BC Managed Care – PPO | Admitting: Physician Assistant

## 2023-10-02 VITALS — BP 124/78 | HR 81 | Temp 97.6°F | Ht 66.0 in | Wt 193.0 lb

## 2023-10-02 DIAGNOSIS — K219 Gastro-esophageal reflux disease without esophagitis: Secondary | ICD-10-CM

## 2023-10-02 DIAGNOSIS — R7303 Prediabetes: Secondary | ICD-10-CM

## 2023-10-02 DIAGNOSIS — I479 Paroxysmal tachycardia, unspecified: Secondary | ICD-10-CM

## 2023-10-02 DIAGNOSIS — E559 Vitamin D deficiency, unspecified: Secondary | ICD-10-CM

## 2023-10-02 DIAGNOSIS — J301 Allergic rhinitis due to pollen: Secondary | ICD-10-CM

## 2023-10-02 DIAGNOSIS — Z23 Encounter for immunization: Secondary | ICD-10-CM | POA: Diagnosis not present

## 2023-10-02 DIAGNOSIS — M62838 Other muscle spasm: Secondary | ICD-10-CM

## 2023-10-02 DIAGNOSIS — I10 Essential (primary) hypertension: Secondary | ICD-10-CM | POA: Diagnosis not present

## 2023-10-02 DIAGNOSIS — K900 Celiac disease: Secondary | ICD-10-CM

## 2023-10-02 DIAGNOSIS — K5730 Diverticulosis of large intestine without perforation or abscess without bleeding: Secondary | ICD-10-CM | POA: Insufficient documentation

## 2023-10-02 DIAGNOSIS — M5416 Radiculopathy, lumbar region: Secondary | ICD-10-CM

## 2023-10-02 DIAGNOSIS — E7800 Pure hypercholesterolemia, unspecified: Secondary | ICD-10-CM | POA: Insufficient documentation

## 2023-10-02 DIAGNOSIS — E782 Mixed hyperlipidemia: Secondary | ICD-10-CM

## 2023-10-02 MED ORDER — VITAMIN D3 50 MCG (2000 UT) PO TABS: 1.0000 | ORAL_TABLET | Freq: Every day | ORAL | 3 refills | Status: AC

## 2023-10-02 MED ORDER — METOCLOPRAMIDE HCL 5 MG PO TABS: 5.0000 mg | ORAL_TABLET | ORAL | 3 refills | Status: AC

## 2023-10-02 MED ORDER — VALSARTAN 80 MG PO TABS: ORAL_TABLET | ORAL | 3 refills | Status: DC

## 2023-10-02 MED ORDER — FEXOFENADINE HCL 180 MG PO TABS: 180.0000 mg | ORAL_TABLET | Freq: Every day | ORAL | 3 refills | Status: AC

## 2023-10-02 MED ORDER — GABAPENTIN 300 MG PO CAPS: ORAL_CAPSULE | ORAL | 1 refills | Status: DC

## 2023-10-02 MED ORDER — CYCLOBENZAPRINE HCL 5 MG PO TABS: 5.0000 mg | ORAL_TABLET | Freq: Three times a day (TID) | ORAL | 1 refills | Status: AC | PRN

## 2023-10-02 MED ORDER — ALBUTEROL SULFATE HFA 108 (90 BASE) MCG/ACT IN AERS: 2.0000 | INHALATION_SPRAY | Freq: Four times a day (QID) | RESPIRATORY_TRACT | 3 refills | Status: AC | PRN

## 2023-10-02 MED ORDER — CARVEDILOL 12.5 MG PO TABS: ORAL_TABLET | ORAL | 0 refills | Status: AC

## 2023-10-02 MED ORDER — OMEPRAZOLE 40 MG PO CPDR: DELAYED_RELEASE_CAPSULE | ORAL | 3 refills | Status: DC

## 2023-10-02 MED ORDER — FLUTICASONE PROPIONATE 50 MCG/ACT NA SUSP: 1.0000 | Freq: Every day | NASAL | 6 refills | Status: DC

## 2023-10-02 MED ORDER — METFORMIN HCL 500 MG PO TABS: ORAL_TABLET | ORAL | 3 refills | Status: DC

## 2023-10-02 MED ORDER — ATORVASTATIN CALCIUM 10 MG PO TABS
10.0000 mg | ORAL_TABLET | Freq: Every day | ORAL | 1 refills | Status: DC
Start: 2023-10-02 — End: 2024-03-22

## 2023-10-02 NOTE — Assessment & Plan Note (Signed)
 Improved symptoms since starting Metformin and dietary modifications. -Continue Metformin. -Check HbA1c today.

## 2023-10-02 NOTE — Assessment & Plan Note (Signed)
 Controlled Continue to monitor for for any worsening or changing symptoms Will adjust treatment depending on symptoms

## 2023-10-02 NOTE — Assessment & Plan Note (Signed)
 Reports of occasional dull ache and pressure sensation in the chest, possibly related to blood pressure. No symptoms of racing heart. -Recommend follow-up with cardiologist.

## 2023-10-02 NOTE — Assessment & Plan Note (Signed)
 Well controlled.  Continue to work on eating a healthy diet and exercise.  Labs drawn today.   No major side effects reported, and no issues with compliance. The current medical regimen is effective;  continue present plan with Diovan 80mg , Coreg 12.5mg  Will adjust medication as needed depending on labs BP Readings from Last 3 Encounters:  10/02/23 124/78  09/03/23 123/74  07/02/23 110/62

## 2023-10-02 NOTE — Patient Instructions (Signed)
 VISIT SUMMARY:  During today's visit, we reviewed your ongoing health conditions, including celiac disease, arthritis, back pain, and elevated blood sugars. You reported no new issues with your current medications and have been adhering to your dietary restrictions. We also discussed a recent dull ache in your chest and your plan to follow up with your cardiologist. Additionally, we reviewed the findings from your recent colonoscopy and endoscopy.  YOUR PLAN:  -HYPERLIPIDEMIA: Hyperlipidemia means having high levels of fats (lipids) in your blood. You will continue your current statin therapy, and we will check your lipid panel today to monitor your levels.  -TYPE 2 DIABETES MELLITUS: Type 2 Diabetes Mellitus is a condition where your body does not use insulin properly, leading to high blood sugar levels. You will continue taking Metformin and we will check your HbA1c today to see how well your blood sugar is being controlled.  -CELIAC DISEASE: Celiac Disease is an immune reaction to eating gluten, a protein found in wheat, barley, and rye. You should continue following a gluten-free diet to manage your symptoms.  -DIVERTICULOSIS: Diverticulosis is a condition where small bulging pouches develop in the digestive tract. You should continue maintaining a high fiber diet to help manage this condition.  -CARDIOVASCULAR: You reported a dull ache and pressure sensation in your chest, which may be related to your blood pressure. It is important to follow up with your cardiologist to further evaluate this symptom.  -GENERAL HEALTH MAINTENANCE: We will continue your current medications and check your complete blood count and liver function tests today. We plan to follow up in 6 months, or sooner if your lab results are significantly abnormal.  INSTRUCTIONS:  Please follow up with your cardiologist regarding the chest pressure sensation. We will check your lipid panel, HbA1c, complete blood count, and  liver function tests today. Plan to return for a follow-up visit in 6 months, or sooner if any lab results are significantly abnormal.

## 2023-10-02 NOTE — Assessment & Plan Note (Signed)
 Controlled Recently adjust by GI due to findings on EGD and colonoscopy Will have repeat in 10 years

## 2023-10-02 NOTE — Assessment & Plan Note (Signed)
 No new symptoms or side effects from statin therapy. -Continue current statin therapy. -Check lipid panel today.

## 2023-10-02 NOTE — Assessment & Plan Note (Signed)
 Maintaining a gluten-free diet with no new symptoms. -Continue gluten-free diet.

## 2023-10-02 NOTE — Assessment & Plan Note (Signed)
 No symptoms of diverticulitis. Maintaining a high fiber diet due to celiac disease. -Continue high fiber diet.

## 2023-10-03 LAB — CBC WITH DIFFERENTIAL/PLATELET
Basophils Absolute: 0.1 10*3/uL (ref 0.0–0.2)
Basos: 1 %
EOS (ABSOLUTE): 0.2 10*3/uL (ref 0.0–0.4)
Eos: 3 %
Hematocrit: 43.8 % (ref 37.5–51.0)
Hemoglobin: 14.7 g/dL (ref 13.0–17.7)
Immature Grans (Abs): 0 10*3/uL (ref 0.0–0.1)
Immature Granulocytes: 0 %
Lymphocytes Absolute: 1.2 10*3/uL (ref 0.7–3.1)
Lymphs: 16 %
MCH: 31.2 pg (ref 26.6–33.0)
MCHC: 33.6 g/dL (ref 31.5–35.7)
MCV: 93 fL (ref 79–97)
Monocytes Absolute: 0.7 10*3/uL (ref 0.1–0.9)
Monocytes: 9 %
Neutrophils Absolute: 5.4 10*3/uL (ref 1.4–7.0)
Neutrophils: 71 %
Platelets: 278 10*3/uL (ref 150–450)
RBC: 4.71 x10E6/uL (ref 4.14–5.80)
RDW: 11.9 % (ref 11.6–15.4)
WBC: 7.6 10*3/uL (ref 3.4–10.8)

## 2023-10-03 LAB — COMPREHENSIVE METABOLIC PANEL
ALT: 35 [IU]/L (ref 0–44)
AST: 22 [IU]/L (ref 0–40)
Albumin: 4.7 g/dL (ref 3.8–4.9)
Alkaline Phosphatase: 74 [IU]/L (ref 44–121)
BUN/Creatinine Ratio: 15 (ref 9–20)
BUN: 18 mg/dL (ref 6–24)
Bilirubin Total: 1.3 mg/dL — ABNORMAL HIGH (ref 0.0–1.2)
CO2: 22 mmol/L (ref 20–29)
Calcium: 10.1 mg/dL (ref 8.7–10.2)
Chloride: 103 mmol/L (ref 96–106)
Creatinine, Ser: 1.18 mg/dL (ref 0.76–1.27)
Globulin, Total: 2.1 g/dL (ref 1.5–4.5)
Glucose: 123 mg/dL — ABNORMAL HIGH (ref 70–99)
Potassium: 5 mmol/L (ref 3.5–5.2)
Sodium: 141 mmol/L (ref 134–144)
Total Protein: 6.8 g/dL (ref 6.0–8.5)
eGFR: 72 mL/min/{1.73_m2} (ref >59)

## 2023-10-03 LAB — LIPID PANEL
Chol/HDL Ratio: 2.5 {ratio} (ref 0.0–5.0)
Cholesterol, Total: 132 mg/dL (ref 100–199)
HDL: 53 mg/dL (ref >39)
LDL Chol Calc (NIH): 60 mg/dL (ref 0–99)
Triglycerides: 106 mg/dL (ref 0–149)
VLDL Cholesterol Cal: 19 mg/dL (ref 5–40)

## 2023-10-03 LAB — HEMOGLOBIN A1C
Est. average glucose Bld gHb Est-mCnc: 128 mg/dL
Hgb A1c MFr Bld: 6.1 % — ABNORMAL HIGH (ref 4.8–5.6)

## 2023-10-03 LAB — VITAMIN D 25 HYDROXY (VIT D DEFICIENCY, FRACTURES): Vit D, 25-Hydroxy: 28 ng/mL — ABNORMAL LOW (ref 30.0–100.0)

## 2023-11-13 ENCOUNTER — Ambulatory Visit: Payer: 59 | Admitting: Gastroenterology

## 2024-01-14 ENCOUNTER — Ambulatory Visit: Admitting: Physician Assistant

## 2024-03-22 ENCOUNTER — Other Ambulatory Visit: Payer: Self-pay | Admitting: Physician Assistant

## 2024-03-22 DIAGNOSIS — E7800 Pure hypercholesterolemia, unspecified: Secondary | ICD-10-CM

## 2024-03-22 DIAGNOSIS — M5416 Radiculopathy, lumbar region: Secondary | ICD-10-CM

## 2024-04-02 ENCOUNTER — Ambulatory Visit: Payer: 59 | Admitting: Physician Assistant

## 2024-05-01 ENCOUNTER — Other Ambulatory Visit: Payer: Self-pay | Admitting: Physician Assistant

## 2024-05-01 DIAGNOSIS — R7303 Prediabetes: Secondary | ICD-10-CM

## 2024-05-27 ENCOUNTER — Encounter: Payer: Self-pay | Admitting: Physician Assistant

## 2024-05-27 ENCOUNTER — Ambulatory Visit: Admitting: Physician Assistant

## 2024-05-27 NOTE — Assessment & Plan Note (Deleted)
 Gary Weaver

## 2024-05-27 NOTE — Assessment & Plan Note (Deleted)
 [name]

## 2024-05-27 NOTE — Progress Notes (Deleted)
 Subjective:  Patient ID: Gary Weaver, male    DOB: Aug 14, 1966  Age: 58 y.o. MRN: [id number]  No chief complaint on file.   HPI: Discussed the use of AI scribe software for clinical note transcription with the patient, who gave verbal consent to proceed.         03/27/2023   10:16 AM 05/22/2022    1:41 PM 10/25/2021   10:53 AM 11/25/2019    9:04 AM  Depression screen PHQ 2/9  Decreased Interest 0 0 0 0  Down, Depressed, Hopeless 0 0 0 0  PHQ - 2 Score 0 0 0 0  Altered sleeping 2 0    Tired, decreased energy 2 0    Change in appetite 0 0    Feeling bad or failure about yourself  0 0    Trouble concentrating 0 0    Moving slowly or fidgety/restless 0 0    Suicidal thoughts 0 0    PHQ-9 Score 4 0    Difficult doing work/chores Not difficult at all Not difficult at all          03/27/2023   10:16 AM  Fall Risk   Falls in the past year? 0  Number falls in past yr: 0  Injury with Fall? 0  Risk for fall due to : No Fall Risks  Follow up Falls evaluation completed    Patient Care Team: Milon Cleaves, [place] as PCP - General (Physician Assistant) Sheena Pugh, DO as PCP - Cardiology (Cardiology) Sheldon Standing, MD as Consulting Physician (General Surgery) Danis, Victory LITTIE MOULD, MD as Consulting Physician (Gastroenterology) Maralee Standing BROCKS., MD as Consulting Physician (Cardiology)   Review of Systems  Constitutional:  Negative for appetite change, fatigue and fever.  HENT:  Negative for congestion, ear pain, sinus pressure and sore throat.   Respiratory:  Negative for cough, chest tightness, shortness of breath and wheezing.   Cardiovascular:  Negative for chest pain and palpitations.  Gastrointestinal:  Negative for abdominal pain, constipation, diarrhea, nausea and vomiting.  Genitourinary:  Negative for dysuria and hematuria.  Musculoskeletal:  Negative for arthralgias, back pain, joint swelling and myalgias.  Skin:  Negative for rash.  Neurological:  Negative for  dizziness, weakness and headaches.  Psychiatric/Behavioral:  Negative for dysphoric mood. The patient is not nervous/anxious.     Current Outpatient Medications on File Prior to Visit  Medication Sig Dispense Refill   albuterol  (VENTOLIN  HFA) 108 (90 Base) MCG/ACT inhaler Inhale 2 puffs into the lungs every 6 (six) hours as needed for wheezing or shortness of breath. 18 g 3   atorvastatin  (LIPITOR) 10 MG tablet TAKE 1 TABLET(10 MG) BY MOUTH DAILY 90 tablet 1   carvedilol  (COREG ) 12.5 MG tablet TAKE 1 TABLET(12.5 MG) BY MOUTH TWICE DAILY 180 tablet 0   Cholecalciferol  (VITAMIN D3) 50 MCG (2000 UT) TABS Take 1 tablet (50 mcg total) by mouth daily. 90 tablet 3   cyclobenzaprine  (FLEXERIL ) 5 MG tablet Take 1 tablet (5 mg total) by mouth 3 (three) times daily as needed for muscle spasms. 90 tablet 1   fexofenadine  (ALLEGRA ) 180 MG tablet Take 1 tablet (180 mg total) by mouth daily. 90 tablet 3   fluticasone  (FLONASE ) 50 MCG/ACT nasal spray Place 1 spray into both nostrils daily. 11.1 mL 6   gabapentin  (NEURONTIN ) 300 MG capsule TAKE 1 CAPSULE BY MOUTH IN THE MORNING AND 2 CAPSULES IN THE EVENING 270 capsule 1   metFORMIN  (GLUCOPHAGE ) 500 MG tablet TAKE 1  TABLET(500 MG) BY MOUTH TWICE DAILY WITH A MEAL 90 tablet 0   metoCLOPramide  (REGLAN ) 5 MG tablet Take 1 tablet (5 mg total) by mouth once a week. TAKE WEEKLY AS NEEDED FOR ACID REFLUX 30 tablet 3   omeprazole  (PRILOSEC) 40 MG capsule Increase omeprazole  40mg  from once daily to twice daily. 60 capsule 3   valsartan  (DIOVAN ) 80 MG tablet Take 1 tablet by mouth daily 90 tablet 3   No current facility-administered medications on file prior to visit.   Past Medical History:  Diagnosis Date   Anxiety    Arrhythmia    Arthritis    Asthma    Bile leak    Celiac disease    GERD (gastroesophageal reflux disease)    Gluten intolerance    Headache    Hearing loss    Hiatal hernia    Hx of acute pancreatitis 05/09/2017   Hyperlipidemia     Hypertension    Intra-abdominal fluid collection 06/20/2018   Memory loss    Pancreatitis    Pneumonia    hx of    Vision abnormalities    Past Surgical History:  Procedure Laterality Date   BILIARY STENT PLACEMENT  06/23/2018   Procedure: BILIARY STENT PLACEMENT;  Surgeon: Saintclair Jasper, MD;  Location: [facility] ENDOSCOPY;  Service: Gastroenterology;;   COLONOSCOPY     ERCP N/A 06/23/2018   Procedure: ENDOSCOPIC RETROGRADE CHOLANGIOPANCREATOGRAPHY (ERCP);  Surgeon: Saintclair Jasper, MD;  Location: [facility] ENDOSCOPY;  Service: Gastroenterology;  Laterality: N/A;   ESOPHAGOGASTRODUODENOSCOPY (EGD) WITH PROPOFOL  N/A 07/23/2018   Procedure: ESOPHAGOGASTRODUODENOSCOPY (EGD) WITH PROPOFOL ;  Surgeon: Legrand Victory LITTIE DOUGLAS, MD;  Location: [facility] ENDOSCOPY;  Service: Gastroenterology;  Laterality: N/A;   LAPAROSCOPIC CHOLECYSTECTOMY SINGLE SITE WITH INTRAOPERATIVE CHOLANGIOGRAM N/A 06/13/2018   Procedure: LAPAROSCOPIC CHOLECYSTECTOMY SINGLE SITE WITH INTRAOPERATIVE CHOLANGIOGRAM ERAS PATHWAY;  Surgeon: Sheldon Standing, MD;  Location: [facility] ORS;  Service: General;  Laterality: N/A;   SPHINCTEROTOMY  06/23/2018   Procedure: SPHINCTEROTOMY;  Surgeon: Saintclair Jasper, MD;  Location: [facility] ENDOSCOPY;  Service: Gastroenterology;;   STENT REMOVAL  07/23/2018   Procedure: STENT REMOVAL;  Surgeon: Legrand Victory LITTIE DOUGLAS, MD;  Location: [facility] ENDOSCOPY;  Service: Gastroenterology;;   UMBILICAL HERNIA REPAIR N/A 06/13/2018   Procedure: LAPAROSCOPIC POSSIBLE OPEN UMBILICAL HERNIA;  Surgeon: Sheldon Standing, MD;  Location: [facility] ORS;  Service: General;  Laterality: N/A;   UPPER GASTROINTESTINAL ENDOSCOPY     WISDOM TOOTH EXTRACTION      Family History  Problem Relation Age of Onset   Hypertension Father    Prostate cancer Father    Colon polyps Father    Bladder Cancer Mother    Colon cancer Maternal Grandmother    Esophageal cancer Neg Hx    Rectal cancer Neg Hx    Stomach cancer Neg Hx    Pancreatic cancer Neg Hx    Social History    Socioeconomic History   Marital status: Married    Spouse name: Not on file   Number of children: 3   Years of education: Not on file   Highest education level: Not on file  Occupational History   Occupation: Surveyor, 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  Tobacco Use   Smoking status: Never   Smokeless tobacco: Current    Types: Chew   Tobacco comments:    ocasionally  Vaping Use   Vaping status: Never Used  Substance and Sexual Activity   Alcohol use: Yes    Alcohol/week: 2.0 standard drinks of alcohol    Types: 2 Standard  drinks or equivalent per week    Comment: ocassionally   Drug use: No   Sexual activity: Yes    Partners: Female  Other Topics Concern   Not on file  Social History Narrative   Not on file   Social Drivers of Health   Financial Resource Strain: Low Risk  (03/27/2023)   Overall Financial Resource Strain (CARDIA)    Difficulty of Paying Living Expenses: Not hard at all  Food Insecurity: No Food Insecurity (03/27/2023)   Hunger Vital Sign    Worried About Running Out of Food in the Last Year: Never true    Ran Out of Food in the Last Year: Never true  Transportation Needs: No Transportation Needs (03/27/2023)   PRAPARE - Administrator, Civil Service (Medical): No    Lack of Transportation (Non-Medical): No  Physical Activity: Inactive (03/27/2023)   Exercise Vital Sign    Days of Exercise per Week: 0 days    Minutes of Exercise per Session: 0 min  Stress: No Stress Concern Present (03/27/2023)   [employer] of Occupational Health - Occupational Stress Questionnaire    Feeling of Stress : Not at all  Social Connections: Moderately Integrated (03/27/2023)   Social Connection and Isolation Panel    Frequency of Communication with Friends and Family: More than three times a week    Frequency of Social Gatherings with Friends and Family: More than three times a week    Attends Religious Services: More than 4 times per year    Active Member of  [employer] or Organizations: No    Attends Banker Meetings: Never    Marital Status: Married    Objective:  There were no vitals taken for this visit.     10/02/2023    8:02 AM 09/03/2023   12:10 PM 09/03/2023   12:02 PM  BP/Weight  Systolic BP 124 123 106  Diastolic BP 78 74 61  Wt. (Lbs) 193    BMI 31.15 kg/m2      Physical Exam  {Perform Simple Foot Exam  Perform Detailed exam:1} {Insert foot Exam (Optional):30965}   Lab Results  Component Value Date   WBC 7.6 10/02/2023   HGB 14.7 10/02/2023   HCT 43.8 10/02/2023   PLT 278 10/02/2023   GLUCOSE 123 (H) 10/02/2023   CHOL 132 10/02/2023   TRIG 106 10/02/2023   HDL 53 10/02/2023   LDLCALC 60 10/02/2023   ALT 35 10/02/2023   AST 22 10/02/2023   NA 141 10/02/2023   K 5.0 10/02/2023   CL 103 10/02/2023   CREATININE 1.18 10/02/2023   BUN 18 10/02/2023   CO2 22 10/02/2023   TSH 1.350 11/14/2016   INR 1.13 06/21/2018   HGBA1C 6.1 (H) 10/02/2023    Results for orders placed or performed in visit on 10/02/23  CBC with Differential/Platelet   Collection Time: 10/02/23  8:33 AM  Result Value Ref Range   WBC 7.6 3.4 - 10.8 x10E3/uL   RBC 4.71 4.14 - 5.80 x10E6/uL   Hemoglobin 14.7 13.0 - 17.7 g/dL   Hematocrit 56.1 62.4 - 51.0 %   MCV 93 79 - 97 fL   MCH 31.2 26.6 - 33.0 pg   MCHC 33.6 31.5 - 35.7 g/dL   RDW 88.0 88.3 - 84.5 %   Platelets 278 150 - 450 x10E3/uL   Neutrophils 71 Not Estab. %   Lymphs 16 Not Estab. %   Monocytes 9 Not Estab. %   Eos  3 Not Estab. %   Basos 1 Not Estab. %   Neutrophils Absolute 5.4 1.4 - 7.0 x10E3/uL   Lymphocytes Absolute 1.2 0.7 - 3.1 x10E3/uL   Monocytes Absolute 0.7 0.1 - 0.9 x10E3/uL   EOS (ABSOLUTE) 0.2 0.0 - 0.4 x10E3/uL   Basophils Absolute 0.1 0.0 - 0.2 x10E3/uL   Immature Granulocytes 0 Not Estab. %   Immature Grans (Abs) 0.0 0.0 - 0.1 x10E3/uL  Comprehensive metabolic panel   Collection Time: 10/02/23  8:33 AM  Result Value Ref Range   Glucose 123 (H) 70  - 99 mg/dL   BUN 18 6 - 24 mg/dL   Creatinine, Ser 8.81 0.76 - 1.27 mg/dL   eGFR 72 >40 fO/fpw/8.26   BUN/Creatinine Ratio 15 9 - 20   Sodium 141 134 - 144 mmol/L   Potassium 5.0 3.5 - 5.2 mmol/L   Chloride 103 96 - 106 mmol/L   CO2 22 20 - 29 mmol/L   Calcium  10.1 8.7 - 10.2 mg/dL   Total Protein 6.8 6.0 - 8.5 g/dL   Albumin 4.7 3.8 - 4.9 g/dL   Globulin, Total 2.1 1.5 - 4.5 g/dL   Bilirubin Total 1.3 (H) 0.0 - 1.2 mg/dL   Alkaline Phosphatase 74 44 - 121 IU/L   AST 22 0 - 40 IU/L   ALT 35 0 - 44 IU/L  Hemoglobin A1c   Collection Time: 10/02/23  8:33 AM  Result Value Ref Range   Hgb A1c MFr Bld 6.1 (H) 4.8 - 5.6 %   Est. average glucose Bld gHb Est-mCnc 128 mg/dL  Lipid panel   Collection Time: 10/02/23  8:33 AM  Result Value Ref Range   Cholesterol, Total 132 100 - 199 mg/dL   Triglycerides 893 0 - 149 mg/dL   HDL 53 >60 mg/dL   VLDL Cholesterol Cal 19 5 - 40 mg/dL   LDL Chol Calc (NIH) 60 0 - 99 mg/dL   Chol/HDL Ratio 2.5 0.0 - 5.0 ratio  VITAMIN D  25 Hydroxy (Vit-D Deficiency, Fractures)   Collection Time: 10/02/23  8:33 AM  Result Value Ref Range   Vit D, 25-Hydroxy 28.0 (L) 30.0 - 100.0 ng/mL  .  Assessment & Plan:   Assessment & Plan Primary hypertension     Mixed hyperlipidemia     Prediabetes     Gastroesophageal reflux disease, unspecified whether esophagitis present     Elevated LDL cholesterol level     Seasonal allergic rhinitis due to pollen     Muscle spasm     Radiculopathy, lumbar region     Paroxysmal tachycardia (HCC)     Vitamin D  deficiency     Celiac disease       There is no height or weight on file to calculate BMI.  Assessment and Plan      No orders of the defined types were placed in this encounter.   No orders of the defined types were placed in this encounter.      Follow-up: No follow-ups on file.  An After Visit Summary was printed and given to the patient.   I,Micco Bourbeau M  Kensly Bowmer,acting as a Neurosurgeon for [employer], [employer].,have documented all relevant documentation on the behalf of Gary Angles, PA,as directed by  Gary Angles, PA while in the presence of Gary Weaver, [place].    Gary Weaver, [place] [facility] [phone]

## 2024-05-30 ENCOUNTER — Ambulatory Visit: Admitting: Physician Assistant

## 2024-05-30 ENCOUNTER — Other Ambulatory Visit: Payer: Self-pay | Admitting: Physician Assistant

## 2024-05-30 ENCOUNTER — Encounter: Payer: Self-pay | Admitting: Physician Assistant

## 2024-05-30 VITALS — BP 118/82 | HR 74 | Temp 97.2°F | Ht 66.0 in | Wt 189.0 lb

## 2024-05-30 DIAGNOSIS — K219 Gastro-esophageal reflux disease without esophagitis: Secondary | ICD-10-CM | POA: Diagnosis not present

## 2024-05-30 DIAGNOSIS — I10 Essential (primary) hypertension: Secondary | ICD-10-CM

## 2024-05-30 DIAGNOSIS — R7303 Prediabetes: Secondary | ICD-10-CM

## 2024-05-30 DIAGNOSIS — I2089 Other forms of angina pectoris: Secondary | ICD-10-CM

## 2024-05-30 DIAGNOSIS — Z23 Encounter for immunization: Secondary | ICD-10-CM

## 2024-05-30 DIAGNOSIS — E7800 Pure hypercholesterolemia, unspecified: Secondary | ICD-10-CM

## 2024-05-30 LAB — POCT GLYCOSYLATED HEMOGLOBIN (HGB A1C): HbA1c POC (<> result, manual entry): 5.5 % (ref 4.0–5.6)

## 2024-05-30 MED ORDER — VALSARTAN 80 MG PO TABS: ORAL_TABLET | ORAL | 0 refills | Status: DC

## 2024-05-30 MED ORDER — VOQUEZNA 10 MG PO TABS: 10.0000 mg | ORAL_TABLET | Freq: Every day | ORAL | 3 refills | Status: DC

## 2024-05-30 MED ORDER — METFORMIN HCL 500 MG PO TABS: ORAL_TABLET | ORAL | 0 refills | Status: DC

## 2024-05-30 MED ORDER — ATORVASTATIN CALCIUM 10 MG PO TABS: 10.0000 mg | ORAL_TABLET | Freq: Every day | ORAL | 1 refills | Status: AC

## 2024-05-30 NOTE — Assessment & Plan Note (Addendum)
 Continue to monitor diet and exercise Continue taking Lipitor 10mg  as prescribed Lab Results  Component Value Date   LDLCALC 60 10/02/2023   Orders:   atorvastatin  (LIPITOR) 10 MG tablet; Take 1 tablet (10 mg total) by mouth daily.

## 2024-05-30 NOTE — Assessment & Plan Note (Addendum)
 Essential hypertension Blood pressure well-controlled. - Continue current antihypertensive regimen. - Continue taking Coreg  12.5mg  - Continue taking Valsartan  80mg  Orders:   valsartan  (DIOVAN ) 80 MG tablet; Take 1 tablet by mouth daily

## 2024-05-30 NOTE — Assessment & Plan Note (Addendum)
 Gastroesophageal reflux disease with hiatal hernia Chronic GERD persists despite omeprazole  and Reglan . Voquezna proposed for better control. - Prescribe Voquezna. - Provide Voquezna samples. - Document failure of previous medications for insurance. Orders:   Vonoprazan Fumarate (VOQUEZNA) 10 MG TABS; Take 10 mg by mouth daily.

## 2024-05-30 NOTE — Assessment & Plan Note (Addendum)
 Prediabetes A1c improved from 6.1 to 5.5 with dietary management. - Continue monitoring A1c. - Encourage dietary management. Orders:   POCT glycosylated hemoglobin (Hb A1C)

## 2024-05-30 NOTE — Progress Notes (Signed)
 [id number]  Subjective:  Patient ID: Si Jachim, male    DOB: [birth year]  Age: 58 y.o. MRN: [id number]  Chief Complaint  Patient presents with   Medical Management of Chronic Issues    HPI:  Discussed the use of AI scribe software for clinical note transcription with the patient, who gave verbal consent to proceed.  History of Present Illness Coreyon Nicotra is a 58 year old male with hypertension, diabetes, celiac disease, and gastroesophageal reflux disease who presents for a chronic follow-up.  His blood pressure is well-controlled with medication, with a recent reading of 118/82 mmHg. He has been out of metformin  for three to four days and is unsure of his current blood sugar levels, although his last A1c was 6.1%.  He describes his diet as irregular due to his job, often eating one meal a day at night. He tries to include vegetables but snacks frequently during the day. He has celiac disease and avoids breads. He experiences regular stomach problems due to celiac disease.  He has chronic gastroesophageal reflux disease and takes omeprazole  twice daily, which helps manage his symptoms. He occasionally uses Reglan  for severe indigestion. Insurance issues have limited his medication options, and he recalls a past medication that worked well but was not covered by community education officer.  He has a history of esophageal issues and undergoes regular endoscopies, the last being about three months ago. He has a hiatal hernia and experiences significant reflux, requiring him to elevate his bed.  He experiences chest tightness, particularly on exertion, and has a history of heart problems, including a leaky heart valve and irregular heartbeat. He takes carvedilol  and has noticed improvement since starting it. He describes a sensation of tightness on the right side of his chest, which he associates with physical activity or stress. He has had episodes of left arm tingling and numbness in the  past.  He has a physically demanding job, which he feels keeps his heart active, but he acknowledges not being as active as he should be outside of work. 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  He is on gabapentin  for arthritis and back problems, and he has a history of heart problems that have been managed with medication adjustments over the years.        05/30/2024    9:32 AM 03/27/2023   10:16 AM 05/22/2022    1:41 PM 10/25/2021   10:53 AM 11/25/2019    9:04 AM  Depression screen PHQ 2/9  Decreased Interest 0 0 0 0 0  Down, Depressed, Hopeless 0 0 0 0 0  PHQ - 2 Score 0 0 0 0 0  Altered sleeping  2 0    Tired, decreased energy  2 0    Change in appetite  0 0    Feeling bad or failure about yourself   0 0    Trouble concentrating  0 0    Moving slowly or fidgety/restless  0 0    Suicidal thoughts  0 0    PHQ-9 Score  4 0    Difficult doing work/chores  Not difficult at all Not difficult at all          05/30/2024    9:32 AM  Fall Risk   Falls in the past year? 0  Number falls in past yr: 0  Injury with Fall? 0  Risk for fall due to : No Fall Risks  Follow  up Falls evaluation completed    Patient Care Team: Milon Cleaves, [place] as PCP - General (Physician Assistant) Sheena Pugh, DO as PCP - Cardiology (Cardiology) Sheldon Standing, MD as Consulting Physician (General Surgery) Danis, Victory LITTIE MOULD, MD as Consulting Physician (Gastroenterology) Maralee Standing BROCKS., MD as Consulting Physician (Cardiology)   Review of Systems  Constitutional:  Negative for appetite change, fatigue and fever.  HENT:  Negative for congestion, ear pain, sinus pressure and sore throat.   Respiratory:  Negative for cough, chest tightness, shortness of breath and wheezing.   Cardiovascular:  Negative for chest pain and palpitations.  Gastrointestinal:  Negative for abdominal pain, constipation, diarrhea, nausea and vomiting.   Genitourinary:  Negative for dysuria and hematuria.  Musculoskeletal:  Negative for arthralgias, back pain, joint swelling and myalgias.  Skin:  Negative for rash.  Neurological:  Negative for dizziness, weakness and headaches.  Psychiatric/Behavioral:  Negative for dysphoric mood. The patient is not nervous/anxious.     Current Outpatient Medications on File Prior to Visit  Medication Sig Dispense Refill   albuterol  (VENTOLIN  HFA) 108 (90 Base) MCG/ACT inhaler Inhale 2 puffs into the lungs every 6 (six) hours as needed for wheezing or shortness of breath. 18 g 3   carvedilol  (COREG ) 12.5 MG tablet TAKE 1 TABLET(12.5 MG) BY MOUTH TWICE DAILY 180 tablet 0   Cholecalciferol  (VITAMIN D3) 50 MCG (2000 UT) TABS Take 1 tablet (50 mcg total) by mouth daily. 90 tablet 3   cyclobenzaprine  (FLEXERIL ) 5 MG tablet Take 1 tablet (5 mg total) by mouth 3 (three) times daily as needed for muscle spasms. 90 tablet 1   fexofenadine  (ALLEGRA ) 180 MG tablet Take 1 tablet (180 mg total) by mouth daily. 90 tablet 3   fluticasone  (FLONASE ) 50 MCG/ACT nasal spray Place 1 spray into both nostrils daily. 11.1 mL 6   metoCLOPramide  (REGLAN ) 5 MG tablet Take 1 tablet (5 mg total) by mouth once a week. TAKE WEEKLY AS NEEDED FOR ACID REFLUX 30 tablet 3   gabapentin  (NEURONTIN ) 300 MG capsule TAKE 1 CAPSULE BY MOUTH IN THE MORNING AND 2 CAPSULES IN THE EVENING 270 capsule 1   No current facility-administered medications on file prior to visit.   Past Medical History:  Diagnosis Date   Anxiety    Arrhythmia    Arthritis    Asthma    Bile leak    Celiac disease    GERD (gastroesophageal reflux disease)    Gluten intolerance    Headache    Hearing loss    Hiatal hernia    Hx of acute pancreatitis 05/09/2017   Hyperlipidemia    Hypertension    Intra-abdominal fluid collection 06/20/2018   Memory loss    Pancreatitis    Pneumonia    hx of    Vision abnormalities    Past Surgical History:  Procedure  Laterality Date   BILIARY STENT PLACEMENT  06/23/2018   Procedure: BILIARY STENT PLACEMENT;  Surgeon: Saintclair Jasper, MD;  Location: [facility] ENDOSCOPY;  Service: Gastroenterology;;   COLONOSCOPY     ERCP N/A 06/23/2018   Procedure: ENDOSCOPIC RETROGRADE CHOLANGIOPANCREATOGRAPHY (ERCP);  Surgeon: Saintclair Jasper, MD;  Location[facility] END[facility]COPY;  Service: Gastroenterology;  Laterality: N/A;   ESOPHAGOGASTRODUODENOSCOPY (EGD) WITH PROPOFOL  N/A 07/23/2018   Procedure: ESOPHAGOGASTRODUODENOSCOPY (EGD) WITH PROPOFOL ;  Surgeon: Legrand Victory LITTIE MOULD, MD;  Location: [facility] ENDOSCOPY;  Service: Gastroenterology;  Laterality: N/A;   LAPAROSCOPIC CHOLECYSTECTOMY SINGLE SITE WITH INTRAOPERATIVE CHOLANGIOGRAM N/A 06/13/2018   Procedure: LAPAROSCOPIC CHOLECYSTECTOMY SINGLE SITE  WITH INTRAOPERATIVE CHOLANGIOGRAM ERAS PATHWAY;  Surgeon: Sheldon Standing, MD;  Location: [facility] ORS;  Service: General;  Laterality: N/A;   SPHINCTEROTOMY  06/23/2018   Procedure: ANNETT;  Surgeon: Saintclair Jasper, MD;  Location: [facility] ENDOSCOPY;  Service: Gastroenterology;;   STENT REMOVAL  07/23/2018   Procedure: STENT REMOVAL;  Surgeon: Legrand Victory LITTIE DOUGLAS, MD;  Location: [facility] ENDOSCOPY;  Service: Gastroenterology;;   UMBILICAL HERNIA REPAIR N/A 06/13/2018   Procedure: LAPAROSCOPIC POSSIBLE OPEN UMBILICAL HERNIA;  Surgeon: Sheldon Standing, MD;  Location: [facility] ORS;  Service: General;  Laterality: N/A;   UPPER GASTROINTESTINAL ENDOSCOPY     WISDOM TOOTH EXTRACTION      Family History  Problem Relation Age of Onset   Hypertension Father    Prostate cancer Father    Colon polyps Father    Bladder Cancer Mother    Colon cancer Maternal Grandmother    Esophageal cancer Neg Hx    Rectal cancer Neg Hx    Stomach cancer Neg Hx    Pancreatic cancer Neg Hx    Social History   Socioeconomic History   Marital status: Married    Spouse name: Not on file   Number of children: 3   Years of education: Not on file   Highest education level: Not on file   Occupational History   Occupation: Surveyor, Minerals at [employer]  Tobacco Use   Smoking status: Never   Smokeless tobacco: Current    Types: Chew   Tobacco comments:    ocasionally  Vaping Use   Vaping status: Never Used  Substance and Sexual Activity   Alcohol use: Yes    Alcohol/week: 2.0 standard drinks of alcohol    Types: 2 Standard drinks or equivalent per week    Comment: ocassionally   Drug use: No   Sexual activity: Yes    Partners: Female  Other Topics Concern   Not on file  Social History Narrative   Not on file   Social Drivers of Health   Financial Resource Strain: Low Risk  (03/27/2023)   Overall Financial Resource Strain (CARDIA)    Difficulty of Paying Living Expenses: Not hard at all  Food Insecurity: No Food Insecurity (03/27/2023)   Hunger Vital Sign    Worried About Running Out of Food in the Last Year: Never true    Ran Out of Food in the Last Year: Never true  Transportation Needs: No Transportation Needs (03/27/2023)   PRAPARE - Administrator, Civil Service (Medical): No    Lack of Transportation (Non-Medical): No  Physical Activity: Inactive (03/27/2023)   Exercise Vital Sign    Days of Exercise per Week: 0 days    Minutes of Exercise per Session: 0 min  Stress: No Stress Concern Present (03/27/2023)   [employer] of Occupational Health - Occupational Stress Questionnaire    Feeling of Stress : Not at all  Social Connections: Moderately Integrated (03/27/2023)   Social Connection and Isolation Panel    Frequency of Communication with Friends and Family: More than three times a week    Frequency of Social Gatherings with Friends and Family: More than three times a week    Attends Religious Services: More than 4 times per year    Active Member of [employer] or Organizations: No    Attends Banker Meetings: Never    Marital Status: Married    Objective:  BP 118/82 (BP Location: Left Arm, Patient Position: Sitting)    Pulse 74  Temp (!) 97.2 F (36.2 C) (Temporal)   Ht 5' 6 (1.676 m)   Wt 189 lb (85.7 kg)   SpO2 97%   BMI 30.51 kg/m      06/03/2024    2:00 PM 05/30/2024    9:31 AM 10/02/2023    8:02 AM  BP/Weight  Systolic BP 138 118 124  Diastolic BP 88 82 78  Wt. (Lbs) 190.2 189 193  BMI 30.7 kg/m2 30.51 kg/m2 31.15 kg/m2    Physical Exam Vitals reviewed.  Constitutional:      Appearance: Normal appearance.  Neck:     Vascular: No carotid bruit.  Cardiovascular:     Rate and Rhythm: Normal rate and regular rhythm.     Heart sounds: Normal heart sounds.  Pulmonary:     Effort: Pulmonary effort is normal.     Breath sounds: Normal breath sounds.  Abdominal:     General: Bowel sounds are normal.     Palpations: Abdomen is soft.     Tenderness: There is no abdominal tenderness.  Neurological:     Mental Status: He is alert and oriented to person, place, and time.  Psychiatric:        Mood and Affect: Mood normal.        Behavior: Behavior normal.         Lab Results  Component Value Date   WBC 7.6 10/02/2023   HGB 14.7 10/02/2023   HCT 43.8 10/02/2023   PLT 278 10/02/2023   GLUCOSE 123 (H) 10/02/2023   CHOL 132 10/02/2023   TRIG 106 10/02/2023   HDL 53 10/02/2023   LDLCALC 60 10/02/2023   ALT 35 10/02/2023   AST 22 10/02/2023   NA 141 10/02/2023   K 5.0 10/02/2023   CL 103 10/02/2023   CREATININE 1.18 10/02/2023   BUN 18 10/02/2023   CO2 22 10/02/2023   TSH 1.350 11/14/2016   INR 1.13 06/21/2018   HGBA1C 5.5 05/30/2024    Results for orders placed or performed in visit on 05/30/24  POCT glycosylated hemoglobin (Hb A1C)   Collection Time: 05/30/24 11:38 AM  Result Value Ref Range   Hemoglobin A1C     HbA1c POC (<> result, manual entry) 5.5 4.0 - 5.6 %   HbA1c, POC (prediabetic range)     HbA1c, POC (controlled diabetic range)    .  Assessment & Plan:   Assessment & Plan Gastroesophageal reflux disease, unspecified whether esophagitis  present Gastroesophageal reflux disease with hiatal hernia Chronic GERD persists despite omeprazole  and Reglan . Voquezna proposed for better control. - Prescribe Voquezna. - Provide Voquezna samples. - Document failure of previous medications for insurance. Orders:   Vonoprazan Fumarate (VOQUEZNA) 10 MG TABS; Take 10 mg by mouth daily.  Primary hypertension Essential hypertension Blood pressure well-controlled. - Continue current antihypertensive regimen. - Continue taking Coreg  12.5mg  - Continue taking Valsartan  80mg  Orders:   valsartan  (DIOVAN ) 80 MG tablet; Take 1 tablet by mouth daily  Prediabetes Prediabetes A1c improved from 6.1 to 5.5 with dietary management. - Continue monitoring A1c. - Encourage dietary management. Orders:   POCT glycosylated hemoglobin (Hb A1C)   Elevated LDL cholesterol level Continue to monitor diet and exercise Continue taking Lipitor 10mg  as prescribed Lab Results  Component Value Date   LDLCALC 60 10/02/2023   Orders:   atorvastatin  (LIPITOR) 10 MG tablet; Take 1 tablet (10 mg total) by mouth daily.  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 EKG shows changes, not acute. - Order cardiac stress test. - Perform EKG and compare with 2022 results. - Consider further evaluation if stress test abnormal. Orders:   EKG 12-Lead   MYOCARDIAL PERFUSION IMAGING; Future    Body mass index is 30.51 kg/m.      Meds ordered this encounter  Medications   valsartan  (DIOVAN ) 80 MG tablet    Sig: Take 1 tablet by mouth daily    Dispense:  90 tablet    Refill:  0    **Patient requests 90 days supply**   DISCONTD: metFORMIN  (GLUCOPHAGE ) 500 MG tablet    Sig: TAKE 1 TABLET(500 MG) BY MOUTH TWICE DAILY WITH A MEAL    Dispense:  90 tablet    Refill:  0    Please  inform patient he needs a office visit for additional refills.   atorvastatin  (LIPITOR) 10 MG tablet    Sig: Take 1 tablet (10 mg total) by mouth daily.    Dispense:  90 tablet    Refill:  1   Vonoprazan Fumarate (VOQUEZNA) 10 MG TABS    Sig: Take 10 mg by mouth daily.    Dispense:  30 tablet    Refill:  3    Has failed Protonics 40mg , Omeprazole  40mg , Pepcid 40mg , Prilosec 40mg     Orders Placed This Encounter  Procedures   Flu vaccine, recombinant, trivalent, inj   MYOCARDIAL PERFUSION IMAGING   POCT glycosylated hemoglobin (Hb A1C)   EKG 12-Lead       Follow-up: Return in about 3 months (around 08/30/2024) for Chronic, Nola.  An After Visit Summary was printed and given to the patient.   I,Lauren M Auman,acting as a neurosurgeon for [employer].,have documented all relevant documentation on the behalf of Nola Angles, PA,as directed by  Nola Angles, PA while in the presence of Nola Angles, [place].    Nola Angles, [place] [facility] [phone]

## 2024-06-03 ENCOUNTER — Encounter: Payer: Self-pay | Admitting: Physician Assistant

## 2024-06-03 ENCOUNTER — Ambulatory Visit: Admitting: Physician Assistant

## 2024-06-03 VITALS — BP 138/88 | HR 102 | Temp 97.8°F | Ht 66.0 in | Wt 190.2 lb

## 2024-06-03 DIAGNOSIS — J0100 Acute maxillary sinusitis, unspecified: Secondary | ICD-10-CM | POA: Diagnosis not present

## 2024-06-03 LAB — POCT INFLUENZA A/B
Influenza A, POC: NEGATIVE
Influenza B, POC: NEGATIVE

## 2024-06-03 LAB — POCT RAPID STREP A (OFFICE): Rapid Strep A Screen: NEGATIVE

## 2024-06-03 LAB — POC COVID19 BINAXNOW: SARS Coronavirus 2 Ag: NEGATIVE

## 2024-06-03 MED ORDER — PREDNISONE 20 MG PO TABS: ORAL_TABLET | ORAL | 0 refills | Status: AC

## 2024-06-03 MED ORDER — AMOXICILLIN-POT CLAVULANATE 875-125 MG PO TABS: 1.0000 | ORAL_TABLET | Freq: Two times a day (BID) | ORAL | 0 refills | Status: DC

## 2024-06-03 NOTE — Progress Notes (Signed)
 Acute Office Visit  Subjective:    Patient ID: Gary Weaver, male    DOB: 1966/02/05, 58 y.o.   MRN: [id number]  Chief Complaint  Patient presents with   Congestion/ear pain/sore throat    HPI: Patient is in today for ear pain and sinus congestion  Discussed the use of AI scribe software for clinical note transcription with the patient, who gave verbal consent to proceed.  History of Present Illness Gary Weaver is a 58 year old male with celiac disease who presents with sinus congestion and ear pain.  He has been experiencing sinus congestion and cough that began on Saturday, with symptoms worsening to include sinus drainage, sinus pain, and epistaxis. He also has throat pain and severe left ear pain, which prevents him from sleeping. His left ear feels swollen, and he has difficulty hearing. Additionally, his eyes are constantly draining, giving the sensation of crying.  He felt feverish last night and has been experiencing a cough that started today. His throat pain was significant last night, but he now has a mild cough. He has a history of colds that can either resolve or progress to bronchitis.  He is currently taking Busex, which causes frequent urination. He has been drinking fluids but eating minimally, having only consumed a salad recently. He has celiac disease and is sensitive to egg, gluten, and latex.  No one else at home is sick, and he tries to avoid contact with others when unwell. He has a history of a CT of the heart in 2020 and is in the process of scheduling a stress test. His A1c is reported to be 5.5.      Past Medical History:  Diagnosis Date   Anxiety    Arrhythmia    Arthritis    Asthma    Bile leak    Celiac disease    GERD (gastroesophageal reflux disease)    Gluten intolerance    Headache    Hearing loss    Hiatal hernia    Hx of acute pancreatitis 05/09/2017   Hyperlipidemia    Hypertension    Intra-abdominal fluid  collection 06/20/2018   Memory loss    Pancreatitis    Pneumonia    hx of    Vision abnormalities     Past Surgical History:  Procedure Laterality Date   BILIARY STENT PLACEMENT  06/23/2018   Procedure: BILIARY STENT PLACEMENT;  Surgeon: Saintclair Jasper, MD;  Location: [facility] ENDOSCOPY;  Service: Gastroenterology;;   COLONOSCOPY     ERCP N/A 06/23/2018   Procedure: ENDOSCOPIC RETROGRADE CHOLANGIOPANCREATOGRAPHY (ERCP);  Surgeon: Saintclair Jasper, MD;  Location: [facility] ENDOSCOPY;  Service: Gastroenterology;  Laterality: N/A;   ESOPHAGOGASTRODUODENOSCOPY (EGD) WITH PROPOFOL  N/A 07/23/2018   Procedure: ESOPHAGOGASTRODUODENOSCOPY (EGD) WITH PROPOFOL ;  Surgeon: Legrand Victory LITTIE DOUGLAS, MD;  Location: [facility] ENDOSCOPY;  Service: Gastroenterology;  Laterality: N/A;   LAPAROSCOPIC CHOLECYSTECTOMY SINGLE SITE WITH INTRAOPERATIVE CHOLANGIOGRAM N/A 06/13/2018   Procedure: LAPAROSCOPIC CHOLECYSTECTOMY SINGLE SITE WITH INTRAOPERATIVE CHOLANGIOGRAM ERAS PATHWAY;  Surgeon: Sheldon Standing, MD;  Location: [facility] ORS;  Service: General;  Laterality: N/A;   SPHINCTEROTOMY  06/23/2018   Procedure: ANNETT;  Surgeon: Saintclair Jasper, MD;  Location: [facility] ENDOSCOPY;  Service: Gastroenterology;;   STENT REMOVAL  07/23/2018   Procedure: STENT REMOVAL;  Surgeon: Legrand Victory LITTIE DOUGLAS, MD;  Location: [facility] ENDOSCOPY;  Service: Gastroenterology;;   UMBILICAL HERNIA REPAIR N/A 06/13/2018   Procedure: LAPAROSCOPIC POSSIBLE OPEN UMBILICAL HERNIA;  Surgeon: Sheldon Standing, MD;  Location: [facility] ORS;  Service: General;  Laterality: N/A;   UPPER GASTROINTESTINAL ENDOSCOPY     WISDOM TOOTH EXTRACTION      Family History  Problem Relation Age of Onset   Hypertension Father    Prostate cancer Father    Colon polyps Father    Bladder Cancer Mother    Colon cancer Maternal Grandmother    Esophageal cancer Neg Hx    Rectal cancer Neg Hx    Stomach cancer Neg Hx    Pancreatic cancer Neg Hx     Social History   Socioeconomic History   Marital status:  Married    Spouse name: Not on file   Number of children: 3   Years of education: Not on file   Highest education level: Not on file  Occupational History   Occupation: Surveyor, Minerals at [employer]  Tobacco Use   Smoking status: Never   Smokeless tobacco: Current    Types: Chew   Tobacco comments:    ocasionally  Vaping Use   Vaping status: Never Used  Substance and Sexual Activity   Alcohol use: Yes    Alcohol/week: 2.0 standard drinks of alcohol    Types: 2 Standard drinks or equivalent per week    Comment: ocassionally   Drug use: No   Sexual activity: Yes    Partners: Female  Other Topics Concern   Not on file  Social History Narrative   Not on file   Social Drivers of Health   Financial Resource Strain: Low Risk  (03/27/2023)   Overall Financial Resource Strain (CARDIA)    Difficulty of Paying Living Expenses: Not hard at all  Food Insecurity: No Food Insecurity (03/27/2023)   Hunger Vital Sign    Worried About Running Out of Food in the Last Year: Never true    Ran Out of Food in the Last Year: Never true  Transportation Needs: No Transportation Needs (03/27/2023)   PRAPARE - Administrator, Civil Service (Medical): No    Lack of Transportation (Non-Medical): No  Physical Activity: Inactive (03/27/2023)   Exercise Vital Sign    Days of Exercise per Week: 0 days    Minutes of Exercise per Session: 0 min  Stress: No Stress Concern Present (03/27/2023)   [employer] of Occupational Health - Occupational Stress Questionnaire    Feeling of Stress : Not at all  Social Connections: Moderately Integrated (03/27/2023)   Social Connection and Isolation Panel    Frequency of Communication with Friends and Family: More than three times a week    Frequency of Social Gatherings with Friends and Family: More than three times a week    Attends Religious Services: More than 4 times per year    Active Member of [employer] or Organizations: No    Attends Tax Inspector Meetings: Never    Marital Status: Married  Catering Manager Violence: Not At Risk (03/27/2023)   Humiliation, Afraid, Rape, and Kick questionnaire    Fear of Current or Ex-Partner: No    Emotionally Abused: No    Physically Abused: No    Sexually Abused: No    Outpatient Medications Prior to Visit  Medication Sig Dispense Refill   albuterol  (VENTOLIN  HFA) 108 (90 Base) MCG/ACT inhaler Inhale 2 puffs into the lungs every 6 (six) hours as needed for wheezing or shortness of breath. 18 g 3   atorvastatin  (LIPITOR) 10 MG tablet Take 1 tablet (10 mg total) by mouth daily. 90 tablet 1   carvedilol  (COREG )  12.5 MG tablet TAKE 1 TABLET(12.5 MG) BY MOUTH TWICE DAILY 180 tablet 0   Cholecalciferol  (VITAMIN D3) 50 MCG (2000 UT) TABS Take 1 tablet (50 mcg total) by mouth daily. 90 tablet 3   cyclobenzaprine  (FLEXERIL ) 5 MG tablet Take 1 tablet (5 mg total) by mouth 3 (three) times daily as needed for muscle spasms. 90 tablet 1   fexofenadine  (ALLEGRA ) 180 MG tablet Take 1 tablet (180 mg total) by mouth daily. 90 tablet 3   fluticasone  (FLONASE ) 50 MCG/ACT nasal spray Place 1 spray into both nostrils daily. 11.1 mL 6   gabapentin  (NEURONTIN ) 300 MG capsule TAKE 1 CAPSULE BY MOUTH IN THE MORNING AND 2 CAPSULES IN THE EVENING 270 capsule 1   metFORMIN  (GLUCOPHAGE ) 500 MG tablet TAKE 1 TABLET(500 MG) BY MOUTH TWICE DAILY WITH A MEAL 180 tablet 0   metoCLOPramide  (REGLAN ) 5 MG tablet Take 1 tablet (5 mg total) by mouth once a week. TAKE WEEKLY AS NEEDED FOR ACID REFLUX 30 tablet 3   valsartan  (DIOVAN ) 80 MG tablet Take 1 tablet by mouth daily 90 tablet 0   Vonoprazan Fumarate (VOQUEZNA) 10 MG TABS Take 10 mg by mouth daily. 30 tablet 3   No facility-administered medications prior to visit.    Allergies  Allergen Reactions   Egg Protein-Containing Drug Products Diarrhea   Gluten Meal Nausea And Vomiting   Latex Rash    Occurs after long use of latex    Review of Systems   Constitutional:  Negative for appetite change, fatigue and fever.  HENT:  Negative for congestion, ear pain, sinus pressure and sore throat.   Respiratory:  Negative for cough, chest tightness, shortness of breath and wheezing.   Cardiovascular:  Negative for chest pain and palpitations.  Gastrointestinal:  Negative for abdominal pain, constipation, diarrhea, nausea and vomiting.  Genitourinary:  Negative for dysuria and hematuria.  Musculoskeletal:  Negative for arthralgias, back pain, joint swelling and myalgias.  Skin:  Negative for rash.  Neurological:  Negative for dizziness, weakness and headaches.  Psychiatric/Behavioral:  Negative for dysphoric mood. The patient is not nervous/anxious.        Objective:        06/03/2024    2:00 PM 05/30/2024    9:31 AM 10/02/2023    8:02 AM  Vitals with BMI  Height 5' 6 5' 6 5' 6  Weight 190 lbs 3 oz 189 lbs 193 lbs  BMI 30.71 30.52 31.17  Systolic 138 118 875  Diastolic 88 82 78  Pulse 102 74 81    Orthostatic VS for the past 72 hrs (Last 3 readings):  Patient Position BP Location  06/03/24 1400 Sitting Left Arm     Physical Exam Vitals reviewed.  Constitutional:      Appearance: Normal appearance.  HENT:     Right Ear: Hearing normal. Swelling present. A middle ear effusion is present. Tympanic membrane is injected and bulging.     Left Ear: Hearing normal. Swelling present. A middle ear effusion is present. Tympanic membrane is injected and bulging.  Cardiovascular:     Rate and Rhythm: Normal rate and regular rhythm.     Heart sounds: Normal heart sounds.  Pulmonary:     Effort: Pulmonary effort is normal.     Breath sounds: Normal breath sounds.  Abdominal:     General: Bowel sounds are normal.     Palpations: Abdomen is soft.     Tenderness: There is no abdominal tenderness.  Neurological:  Mental Status: He is alert and oriented to person, place, and time.  Psychiatric:        Mood and Affect: Mood normal.         Behavior: Behavior normal.     Health Maintenance Due  Topic Date Due   Pneumococcal Vaccine: 50+ Years (1 of 2 - PCV) Never done   Hepatitis B Vaccines 19-59 Average Risk (1 of 3 - 19+ 3-dose series) Never done   COVID-19 Vaccine (3 - 2025-26 season) 04/14/2024       Topic Date Due   Hepatitis B Vaccines 19-59 Average Risk (1 of 3 - 19+ 3-dose series) Never done     Lab Results  Component Value Date   TSH 1.350 11/14/2016   Lab Results  Component Value Date   WBC 7.6 10/02/2023   HGB 14.7 10/02/2023   HCT 43.8 10/02/2023   MCV 93 10/02/2023   PLT 278 10/02/2023   Lab Results  Component Value Date   NA 141 10/02/2023   K 5.0 10/02/2023   CO2 22 10/02/2023   GLUCOSE 123 (H) 10/02/2023   BUN 18 10/02/2023   CREATININE 1.18 10/02/2023   BILITOT 1.3 (H) 10/02/2023   ALKPHOS 74 10/02/2023   AST 22 10/02/2023   ALT 35 10/02/2023   PROT 6.8 10/02/2023   ALBUMIN 4.7 10/02/2023   CALCIUM  10.1 10/02/2023   ANIONGAP 8 06/24/2018   EGFR 72 10/02/2023   GFR 72.02 05/07/2017   Lab Results  Component Value Date   CHOL 132 10/02/2023   Lab Results  Component Value Date   HDL 53 10/02/2023   Lab Results  Component Value Date   LDLCALC 60 10/02/2023   Lab Results  Component Value Date   TRIG 106 10/02/2023   Lab Results  Component Value Date   CHOLHDL 2.5 10/02/2023   Lab Results  Component Value Date   HGBA1C 5.5 05/30/2024        Results for orders placed or performed in visit on 06/03/24  POC COVID-19 BinaxNow   Collection Time: 06/03/24  3:08 PM  Result Value Ref Range   SARS Coronavirus 2 Ag Negative Negative  POCT rapid strep A   Collection Time: 06/03/24  3:08 PM  Result Value Ref Range   Rapid Strep A Screen Negative Negative  POCT Influenza A/B   Collection Time: 06/03/24  3:09 PM  Result Value Ref Range   Influenza A, POC Negative Negative   Influenza B, POC Negative Negative     Assessment & Plan:   Assessment & Plan Acute  non-recurrent maxillary sinusitis Acute sinusitis with left otitis media, likely viral or bacterial.  Risk of progression to bronchitis if untreated. - Prescribed prednisone  for inflammation. - Prescribed Augmentin for one week. - Advised hot showers for symptom relief. - Instructed to maintain hydration and rest. - Consider chest x-ray and stronger antibiotics if no improvement by day five or six. Orders:   predniSONE  (DELTASONE ) 20 MG tablet; Take 3 tablets (60 mg total) by mouth daily with breakfast for 3 days, THEN 2 tablets (40 mg total) daily with breakfast for 3 days, THEN 1 tablet (20 mg total) daily with breakfast for 3 days.   amoxicillin -clavulanate (AUGMENTIN) 875-125 MG tablet; Take 1 tablet by mouth 2 (two) times daily.   POCT Influenza A/B   POC COVID-19 BinaxNow   POCT rapid strep A   Body mass index is 30.7 kg/m..  Meds ordered this encounter  Medications   predniSONE  (DELTASONE )  20 MG tablet    Sig: Take 3 tablets (60 mg total) by mouth daily with breakfast for 3 days, THEN 2 tablets (40 mg total) daily with breakfast for 3 days, THEN 1 tablet (20 mg total) daily with breakfast for 3 days.    Dispense:  18 tablet    Refill:  0   amoxicillin -clavulanate (AUGMENTIN) 875-125 MG tablet    Sig: Take 1 tablet by mouth 2 (two) times daily.    Dispense:  14 tablet    Refill:  0    Orders Placed This Encounter  Procedures   POCT Influenza A/B   POC COVID-19 BinaxNow   POCT rapid strep A     Follow-up: No follow-ups on file.  An After Visit Summary was printed and given to the patient.   I,Marla I Leal-Borjas,acting as a scribe for [employer], PA.,have documented all relevant documentation on the behalf of Nola Angles, PA,as directed by  Nola Angles, PA while in the presence of Nola Angles, [place].   Nola Angles, [place] [facility] [phone]

## 2024-06-04 NOTE — Assessment & Plan Note (Signed)
 Acute sinusitis with left otitis media, likely viral or bacterial.  Risk of progression to bronchitis if untreated. - Prescribed prednisone  for inflammation. - Prescribed Augmentin for one week. - Advised hot showers for symptom relief. - Instructed to maintain hydration and rest. - Consider chest x-ray and stronger antibiotics if no improvement by day five or six. Orders:   predniSONE  (DELTASONE ) 20 MG tablet; Take 3 tablets (60 mg total) by mouth daily with breakfast for 3 days, THEN 2 tablets (40 mg total) daily with breakfast for 3 days, THEN 1 tablet (20 mg total) daily with breakfast for 3 days.   amoxicillin -clavulanate (AUGMENTIN) 875-125 MG tablet; Take 1 tablet by mouth 2 (two) times daily.   POCT Influenza A/B   POC COVID-19 BinaxNow   POCT rapid strep A

## 2024-06-10 ENCOUNTER — Encounter: Payer: Self-pay | Admitting: *Deleted

## 2024-06-18 ENCOUNTER — Other Ambulatory Visit: Payer: Self-pay | Admitting: Physician Assistant

## 2024-06-18 ENCOUNTER — Ambulatory Visit: Attending: Physician Assistant

## 2024-06-18 DIAGNOSIS — I2089 Other forms of angina pectoris: Secondary | ICD-10-CM

## 2024-06-18 DIAGNOSIS — I10 Essential (primary) hypertension: Secondary | ICD-10-CM

## 2024-06-18 LAB — MYOCARDIAL PERFUSION IMAGING
Angina Index: 0
Estimated workload: 10.1
Exercise duration (min): 9 min
Exercise duration (sec): 0 s
LV dias vol: 78 mL (ref 62–150)
LV sys vol: 31 mL (ref 4.2–5.8)
MPHR: 163 {beats}/min
Nuc Stress EF: 60 %
Peak HR: 153 {beats}/min
Percent HR: 93 %
RPE: 18
Rest HR: 83 {beats}/min
Rest Nuclear Isotope Dose: 10.4 mCi
SDS: 0
SRS: 0
SSS: 0
Stress Nuclear Isotope Dose: 32.7 mCi
TID: 1.11

## 2024-06-18 MED ORDER — TECHNETIUM TC 99M TETROFOSMIN IV KIT
10.4000 | PACK | Freq: Once | INTRAVENOUS | Status: AC | PRN
  Administered 2024-06-18: 10.4 via INTRAVENOUS

## 2024-06-18 MED ORDER — TECHNETIUM TC 99M TETROFOSMIN IV KIT
32.7000 | PACK | Freq: Once | INTRAVENOUS | Status: AC | PRN
  Administered 2024-06-18: 32.7 via INTRAVENOUS

## 2024-06-22 ENCOUNTER — Ambulatory Visit: Payer: Self-pay | Admitting: Physician Assistant

## 2024-08-12 ENCOUNTER — Ambulatory Visit: Payer: Self-pay

## 2024-08-12 NOTE — Telephone Encounter (Signed)
 FYI Only or Action Required?: FYI only for provider: appointment scheduled on 08/15/24.  Patient was last seen in primary care on 06/03/2024 by Milon Cleaves, PA.  Called Nurse Triage reporting URI.  Symptoms began a week ago.  Interventions attempted: OTC medications: Mucinex, Tylenol , nasal spray.  Symptoms are: gradually worsening.  Triage Disposition: Home Care  Patient/caregiver understands and will follow disposition?:   Reason for Disposition  Common cold with no complications  Answer Assessment - Initial Assessment Questions Onset before christmas 08/07/24.  Pt states he has a sore throat, chest congestion, productive cough - gray color mucus , sore throat, and sinus pressure. Cough is very productive at night.   Denies fever  Pt currently taking Mucinex, ibuprofen, nasal spray.  1. ONSET: When did the nasal discharge start?      Last week 2. AMOUNT: How much discharge is there?      N/a  3. COUGH: Do you have a cough? If Yes, ask: Describe the color of your mucus. (e.g., clear, white, yellow, green)     Gray color 4. RESPIRATORY DISTRESS: Describe your breathing.      No difficulties - just hoarsness  5. FEVER: Do you have a fever? If Yes, ask: What is your temperature, how was it measured, and when did it start?     Denies  6. SEVERITY: Overall, how bad are you feeling right now? (e.g., doesn't interfere with normal activities, staying home from school/work, staying in bed)      N/a 7. OTHER SYMPTOMS: Do you have any other symptoms? (e.g., earache, mouth sores, sore throat, wheezing)     Earache and sore throat  Protocols used: Common Cold-A-AH  Copied from CRM #[id number]. Topic: Clinical - Red Word Triage >> Aug 12, 2024  9:33 AM Willma SAUNDERS wrote: Red Word that prompted transfer to Nurse Triage: Patient states since before Christmas has a sore throat, chest congestion, cough with discolored mucous, laryngitis and sinus pressure.

## 2024-08-13 NOTE — Progress Notes (Deleted)
 "  Acute Office Visit  Subjective:    Patient ID: Gary Weaver, male    DOB: [birth year], 58 y.o.   MRN: [id number]  No chief complaint on file.   HPI: Patient is in today for ***  Discussed the use of AI scribe software for clinical note transcription with the patient, who gave verbal consent to proceed.      Past Medical History:  Diagnosis Date   Anxiety    Arrhythmia    Arthritis    Asthma    Bile leak    Celiac disease    GERD (gastroesophageal reflux disease)    Gluten intolerance    Headache    Hearing loss    Hiatal hernia    Hx of acute pancreatitis 05/09/2017   Hyperlipidemia    Hypertension    Intra-abdominal fluid collection 06/20/2018   Memory loss    Pancreatitis    Pneumonia    hx of    Vision abnormalities     Past Surgical History:  Procedure Laterality Date   BILIARY STENT PLACEMENT  06/23/2018   Procedure: BILIARY STENT PLACEMENT;  Surgeon: Saintclair Jasper, MD;  Location: [facility] ENDOSCOPY;  Service: Gastroenterology;;   COLONOSCOPY     ERCP N/A 06/23/2018   Procedure: ENDOSCOPIC RETROGRADE CHOLANGIOPANCREATOGRAPHY (ERCP);  Surgeon: Saintclair Jasper, MD;  Location: [facility] ENDOSCOPY;  Service: Gastroenterology;  Laterality: N/A;   ESOPHAGOGASTRODUODENOSCOPY (EGD) WITH PROPOFOL  N/A 07/23/2018   Procedure: ESOPHAGOGASTRODUODENOSCOPY (EGD) WITH PROPOFOL ;  Surgeon: Legrand Victory LITTIE DOUGLAS, MD;  Location: [facility] ENDOSCOPY;  Service: Gastroenterology;  Laterality: N/A;   LAPAROSCOPIC CHOLECYSTECTOMY SINGLE SITE WITH INTRAOPERATIVE CHOLANGIOGRAM N/A 06/13/2018   Procedure: LAPAROSCOPIC CHOLECYSTECTOMY SINGLE SITE WITH INTRAOPERATIVE CHOLANGIOGRAM ERAS PATHWAY;  Surgeon: Sheldon Standing, MD;  Location: [facility] ORS;  Service: General;  Laterality: N/A;   SPHINCTEROTOMY  06/23/2018   Procedure: SPHINCTEROTOMY;  Surgeon: Saintclair Jasper, MD;  Location: [facility] ENDOSCOPY;  Service: Gastroenterology;;   STENT REMOVAL  07/23/2018   Procedure: STENT REMOVAL;  Surgeon: Legrand Victory LITTIE DOUGLAS, MD;   Location: [facility] ENDOSCOPY;  Service: Gastroenterology;;   UMBILICAL HERNIA REPAIR N/A 06/13/2018   Procedure: LAPAROSCOPIC POSSIBLE OPEN UMBILICAL HERNIA;  Surgeon: Sheldon Standing, MD;  Location: [facility] ORS;  Service: General;  Laterality: N/A;   UPPER GASTROINTESTINAL ENDOSCOPY     WISDOM TOOTH EXTRACTION      Family History  Problem Relation Age of Onset   Hypertension Father    Prostate cancer Father    Colon polyps Father    Bladder Cancer Mother    Colon cancer Maternal Grandmother    Esophageal cancer Neg Hx    Rectal cancer Neg Hx    Stomach cancer Neg Hx    Pancreatic cancer Neg Hx     Social History   Socioeconomic History   Marital status: Married    Spouse name: Not on file   Number of children: 3   Years of education: Not on file   Highest education level: Not on file  Occupational History   Occupation: Surveyor, Minerals at [employer]  Tobacco Use   Smoking status: Never   Smokeless tobacco: Current    Types: Chew   Tobacco comments:    ocasionally  Vaping Use   Vaping status: Never Used  Substance and Sexual Activity   Alcohol use: Yes    Alcohol/week: 2.0 standard drinks of alcohol    Types: 2 Standard drinks or equivalent per week    Comment: ocassionally   Drug use: No   Sexual activity:  Yes    Partners: Female  Other Topics Concern   Not on file  Social History Narrative   Not on file   Social Drivers of Health   Tobacco Use: High Risk (06/03/2024)   Patient History    Smoking Tobacco Use: Never    Smokeless Tobacco Use: Current    Passive Exposure: Not on file  Financial Resource Strain: Low Risk (03/27/2023)   Overall Financial Resource Strain (CARDIA)    Difficulty of Paying Living Expenses: Not hard at all  Food Insecurity: No Food Insecurity (03/27/2023)   Hunger Vital Sign    Worried About Running Out of Food in the Last Year: Never true    Ran Out of Food in the Last Year: Never true  Transportation Needs: No Transportation Needs  (03/27/2023)   PRAPARE - Administrator, Civil Service (Medical): No    Lack of Transportation (Non-Medical): No  Physical Activity: Inactive (03/27/2023)   Exercise Vital Sign    Days of Exercise per Week: 0 days    Minutes of Exercise per Session: 0 min  Stress: No Stress Concern Present (03/27/2023)   [employer] of Occupational Health - Occupational Stress Questionnaire    Feeling of Stress : Not at all  Social Connections: Moderately Integrated (03/27/2023)   Social Connection and Isolation Panel    Frequency of Communication with Friends and Family: More than three times a week    Frequency of Social Gatherings with Friends and Family: More than three times a week    Attends Religious Services: More than 4 times per year    Active Member of [employer] or Organizations: No    Attends Banker Meetings: Never    Marital Status: Married  Catering Manager Violence: Not At Risk (03/27/2023)   Humiliation, Afraid, Rape, and Kick questionnaire    Fear of Current or Ex-Partner: No    Emotionally Abused: No    Physically Abused: No    Sexually Abused: No  Depression (PHQ2-9): Low Risk (05/30/2024)   Depression (PHQ2-9)    PHQ-2 Score: 0  Alcohol Screen: Low Risk (03/27/2023)   Alcohol Screen    Last Alcohol Screening Score (AUDIT): 0  Housing: Low Risk (03/27/2023)   Housing    Last Housing Risk Score: 0  Utilities: Not At Risk (03/27/2023)   AHC Utilities    Threatened with loss of utilities: No  Health Literacy: Adequate Health Literacy (03/27/2023)   B1300 Health Literacy    Frequency of need for help with medical instructions: Never    Outpatient Medications Prior to Visit  Medication Sig Dispense Refill   albuterol  (VENTOLIN  HFA) 108 (90 Base) MCG/ACT inhaler Inhale 2 puffs into the lungs every 6 (six) hours as needed for wheezing or shortness of breath. 18 g 3   amoxicillin -clavulanate (AUGMENTIN ) 875-125 MG tablet Take 1 tablet by mouth 2 (two) times  daily. 14 tablet 0   atorvastatin  (LIPITOR) 10 MG tablet Take 1 tablet (10 mg total) by mouth daily. 90 tablet 1   carvedilol  (COREG ) 12.5 MG tablet TAKE 1 TABLET(12.5 MG) BY MOUTH TWICE DAILY 180 tablet 0   Cholecalciferol  (VITAMIN D3) 50 MCG (2000 UT) TABS Take 1 tablet (50 mcg total) by mouth daily. 90 tablet 3   cyclobenzaprine  (FLEXERIL ) 5 MG tablet Take 1 tablet (5 mg total) by mouth 3 (three) times daily as needed for muscle spasms. 90 tablet 1   fexofenadine  (ALLEGRA ) 180 MG tablet Take 1 tablet (180 mg total) by  mouth daily. 90 tablet 3   fluticasone  (FLONASE ) 50 MCG/ACT nasal spray Place 1 spray into both nostrils daily. 11.1 mL 6   gabapentin  (NEURONTIN ) 300 MG capsule TAKE 1 CAPSULE BY MOUTH IN THE MORNING AND 2 CAPSULES IN THE EVENING 270 capsule 1   metFORMIN  (GLUCOPHAGE ) 500 MG tablet TAKE 1 TABLET(500 MG) BY MOUTH TWICE DAILY WITH A MEAL 180 tablet 0   metoCLOPramide  (REGLAN ) 5 MG tablet Take 1 tablet (5 mg total) by mouth once a week. TAKE WEEKLY AS NEEDED FOR ACID REFLUX 30 tablet 3   valsartan  (DIOVAN ) 80 MG tablet Take 1 tablet by mouth daily 90 tablet 0   Vonoprazan Fumarate  (VOQUEZNA ) 10 MG TABS Take 10 mg by mouth daily. 30 tablet 3   No facility-administered medications prior to visit.    Allergies[1]  Review of Systems     Objective:        06/03/2024    2:00 PM 05/30/2024    9:31 AM 10/02/2023    8:02 AM  Vitals with BMI  Height 5' 6 5' 6 5' 6  Weight 190 lbs 3 oz 189 lbs 193 lbs  BMI 30.71 30.52 31.17  Systolic 138 118 875  Diastolic 88 82 78  Pulse 102 74 81    No data found.   Physical Exam  Health Maintenance Due  Topic Date Due   Pneumococcal Vaccine: 50+ Years (1 of 2 - PCV) Never done   Hepatitis B Vaccines 19-59 Average Risk (1 of 3 - 19+ 3-dose series) Never done   COVID-19 Vaccine (3 - 2025-26 season) 04/14/2024       Topic Date Due   Hepatitis B Vaccines 19-59 Average Risk (1 of 3 - 19+ 3-dose series) Never done     Lab  Results  Component Value Date   TSH 1.350 11/14/2016   Lab Results  Component Value Date   WBC 7.6 10/02/2023   HGB 14.7 10/02/2023   HCT 43.8 10/02/2023   MCV 93 10/02/2023   PLT 278 10/02/2023   Lab Results  Component Value Date   NA 141 10/02/2023   K 5.0 10/02/2023   CO2 22 10/02/2023   GLUCOSE 123 (H) 10/02/2023   BUN 18 10/02/2023   CREATININE 1.18 10/02/2023   BILITOT 1.3 (H) 10/02/2023   ALKPHOS 74 10/02/2023   AST 22 10/02/2023   ALT 35 10/02/2023   PROT 6.8 10/02/2023   ALBUMIN 4.7 10/02/2023   CALCIUM  10.1 10/02/2023   ANIONGAP 8 06/24/2018   EGFR 72 10/02/2023   GFR 72.02 05/07/2017   Lab Results  Component Value Date   CHOL 132 10/02/2023   Lab Results  Component Value Date   HDL 53 10/02/2023   Lab Results  Component Value Date   LDLCALC 60 10/02/2023   Lab Results  Component Value Date   TRIG 106 10/02/2023   Lab Results  Component Value Date   CHOLHDL 2.5 10/02/2023   Lab Results  Component Value Date   HGBA1C 5.5 05/30/2024        Results for orders placed or performed in visit on 06/18/24  MYOCARDIAL PERFUSION IMAGING   Collection Time: 06/18/24  2:48 PM  Result Value Ref Range   Rest Nuclear Isotope Dose 10.4 mCi   Stress Nuclear Isotope Dose 32.7 mCi   Angina Index 0    Rest HR 83.0 bpm   Rest BP 128/93 mmHg   Exercise duration (min) 9 min   Exercise duration (sec) 0 sec  Estimated workload 10.1    Peak HR 153 bpm   Peak BP 154/69 mmHg   MPHR 163 bpm   Percent HR 93.0 %   RPE 18.0    SSS 0.0    SRS 0.0    SDS 0.0    TID 1.11    LV sys vol 31.0 4.2 - 5.8 mL   LV dias vol 78.0 62 - 150 mL   Nuc Stress EF 60 %     Assessment & Plan:   Assessment & Plan     There is no height or weight on file to calculate BMI.[name]  Assessment and Plan      No orders of the defined types were placed in this encounter.   No orders of the defined types were placed in this encounter.    Follow-up: No follow-ups on  file.  An After Visit Summary was printed and given to the patient.  Nola Angles, [place] [facility] [phone]    [1]  Allergies Allergen Reactions   Egg Protein-Containing Drug Products Diarrhea   Gluten Meal Nausea And Vomiting   Latex Rash    Occurs after long use of latex   "

## 2024-08-15 ENCOUNTER — Ambulatory Visit: Admitting: Physician Assistant

## 2024-09-01 ENCOUNTER — Ambulatory Visit: Admitting: Physician Assistant

## 2024-09-01 ENCOUNTER — Encounter: Payer: Self-pay | Admitting: Physician Assistant

## 2024-09-01 VITALS — BP 112/80 | HR 72 | Temp 97.4°F | Resp 16 | Ht 66.0 in | Wt 198.0 lb

## 2024-09-01 DIAGNOSIS — R7303 Prediabetes: Secondary | ICD-10-CM | POA: Diagnosis not present

## 2024-09-01 DIAGNOSIS — J301 Allergic rhinitis due to pollen: Secondary | ICD-10-CM

## 2024-09-01 DIAGNOSIS — E559 Vitamin D deficiency, unspecified: Secondary | ICD-10-CM | POA: Diagnosis not present

## 2024-09-01 DIAGNOSIS — I479 Paroxysmal tachycardia, unspecified: Secondary | ICD-10-CM

## 2024-09-01 DIAGNOSIS — E7800 Pure hypercholesterolemia, unspecified: Secondary | ICD-10-CM

## 2024-09-01 DIAGNOSIS — R55 Syncope and collapse: Secondary | ICD-10-CM

## 2024-09-01 DIAGNOSIS — I10 Essential (primary) hypertension: Secondary | ICD-10-CM | POA: Diagnosis not present

## 2024-09-01 MED ORDER — FLUTICASONE PROPIONATE 50 MCG/ACT NA SUSP: 1.0000 | Freq: Every day | NASAL | 6 refills | Status: AC

## 2024-09-01 NOTE — Assessment & Plan Note (Addendum)
 Controlled Continue to monitor symptoms Continue using Flonase  50mcg as prescribed Will adjust treatment depending on symptoms Orders:   fluticasone  (FLONASE ) 50 MCG/ACT nasal spray; Place 1 spray into both nostrils daily.

## 2024-09-01 NOTE — Assessment & Plan Note (Addendum)
 Orders:    Hemoglobin A1c

## 2024-09-01 NOTE — Assessment & Plan Note (Addendum)
 Orders:    Lipid panel

## 2024-09-01 NOTE — Progress Notes (Unsigned)
 "  Subjective:  Patient ID: Gary Weaver, male    DOB: 16-Feb-1966  Age: 59 y.o. MRN: [id number]  No chief complaint on file.   HPI: Discussed the use of AI scribe software for clinical note transcription with the patient, who gave verbal consent to proceed.  History of Present Illness Gary Weaver is a 59 year old male with arrhythmia and a leaky heart valve who presents with a recent syncopal episode.  He experienced a syncopal episode while attending a gun show with his son. Prior to losing consciousness, he felt warm, weak, and dizzy. The environment was crowded and warm, and he was dressed in layers. He attempted to alleviate his symptoms by consuming Lifesavers and sweet tea but eventually lost consciousness, hitting his head on a chair and then the concrete floor. He was unconscious for approximately ten seconds according to bystanders, including a nurse who witnessed the event. Upon regaining consciousness, he felt embarrassed and declined EMS assistance, although he continued to feel hot and dizzy for a short period afterward.  He has a history of syncope during blood draws, particularly when multiple attempts are needed. These episodes are similar to the recent syncopal event, with a sudden loss of consciousness and disorientation upon waking. No chest pain or shortness of breath is associated with these episodes. He reports occasional night sweats and a history of palpitations prior to being on Cardene, which has since stabilized his blood pressure and reduced these symptoms.  He experiences a sensation of tingling and numbness in his chest, described as a 'pins and needles' feeling, which sometimes radiates upwards. He works long hours, often walking extensively, and notes that he becomes short of breath with physical exertion. He has a history of arrhythmia and was last seen by a cardiologist in 2022, where he underwent a stress test and echocardiogram, both of which were  reportedly normal at the time. He is currently on Cozaar for his heart condition.  He keeps Lifesavers on hand for episodes of dizziness, which he associates with low blood sugar, and finds that consuming something sweet helps alleviate these symptoms within about thirty minutes. He works long shifts, often 10 to 14 hours, and describes his job as physically demanding, which may contribute to his fatigue and episodes of dizziness.           05/30/2024    9:32 AM 03/27/2023   10:16 AM 05/22/2022    1:41 PM 10/25/2021   10:53 AM 11/25/2019    9:04 AM  Depression screen PHQ 2/9  Decreased Interest 0 0 0 0 0  Down, Depressed, Hopeless 0 0 0 0 0  PHQ - 2 Score 0 0 0 0 0  Altered sleeping  2 0    Tired, decreased energy  2 0    Change in appetite  0 0    Feeling bad or failure about yourself   0 0    Trouble concentrating  0 0    Moving slowly or fidgety/restless  0 0    Suicidal thoughts  0 0    PHQ-9 Score  4  0     Difficult doing work/chores  Not difficult at all Not difficult at all       Data saved with a previous flowsheet row definition        05/30/2024    9:32 AM  Fall Risk   Falls in the past year? 0  Number falls in past yr: 0  Injury with  Fall? 0   Risk for fall due to : No Fall Risks  Follow up Falls evaluation completed     Data saved with a previous flowsheet row definition    Patient Care Team: Milon Cleaves, [place] as PCP - General (Physician Assistant) Sheena Pugh, DO as PCP - Cardiology (Cardiology) Sheldon Standing, MD as Consulting Physician (General Surgery) Danis, Victory LITTIE MOULD, MD as Consulting Physician (Gastroenterology) Maralee Standing BROCKS., MD as Consulting Physician (Cardiology)   Review of Systems  Constitutional:  Negative for chills, fatigue, fever and unexpected weight change.  HENT:  Negative for congestion, ear pain, sinus pain and sore throat.   Respiratory:  Negative for cough and shortness of breath.   Cardiovascular:  Negative for chest pain  and palpitations.  Gastrointestinal:  Negative for abdominal pain, blood in stool, constipation, diarrhea, nausea and vomiting.  Endocrine: Negative for polydipsia.  Genitourinary:  Negative for dysuria.  Musculoskeletal:  Negative for back pain.  Skin:  Negative for rash.  Neurological:  Negative for headaches.    Medications Ordered Prior to Encounter[1] Past Medical History:  Diagnosis Date   Anxiety    Arrhythmia    Arthritis    Asthma    Bile leak    Celiac disease    GERD (gastroesophageal reflux disease)    Gluten intolerance    Headache    Hearing loss    Hiatal hernia    Hx of acute pancreatitis 05/09/2017   Hyperlipidemia    Hypertension    Intra-abdominal fluid collection 06/20/2018   Memory loss    Pancreatitis    Pneumonia    hx of    Vision abnormalities    Past Surgical History:  Procedure Laterality Date   BILIARY STENT PLACEMENT  06/23/2018   Procedure: BILIARY STENT PLACEMENT;  Surgeon: Saintclair Jasper, MD;  Location: [facility] ENDOSCOPY;  Service: Gastroenterology;;   COLONOSCOPY     ERCP N/A 06/23/2018   Procedure: ENDOSCOPIC RETROGRADE CHOLANGIOPANCREATOGRAPHY (ERCP);  Surgeon: Saintclair Jasper, MD;  Location: [facility] ENDOSCOPY;  Service: Gastroenterology;  Laterality: N/A;   ESOPHAGOGASTRODUODENOSCOPY (EGD) WITH PROPOFOL  N/A 07/23/2018   Procedure: ESOPHAGOGASTRODUODENOSCOPY (EGD) WITH PROPOFOL ;  Surgeon: Legrand Victory LITTIE MOULD, MD;  Location: [facility] ENDOSCOPY;  Service: Gastroenterology;  Laterality: N/A;   LAPAROSCOPIC CHOLECYSTECTOMY SINGLE SITE WITH INTRAOPERATIVE CHOLANGIOGRAM N/A 06/13/2018   Procedure: LAPAROSCOPIC CHOLECYSTECTOMY SINGLE SITE WITH INTRAOPERATIVE CHOLANGIOGRAM ERAS PATHWAY;  Surgeon: Sheldon Standing, MD;  Location: [facility] ORS;  Service: General;  Laterality: N/A;   SPHINCTEROTOMY  06/23/2018   Procedure: SPHINCTEROTOMY;  Surgeon: Saintclair Jasper, MD;  Location: [facility] ENDOSCOPY;  Service: Gastroenterology;;   STENT REMOVAL  07/23/2018    Procedure: STENT REMOVAL;  Surgeon: Legrand Victory LITTIE MOULD, MD;  Location: [facility] ENDOSCOPY;  Service: Gastroenterology;;   UMBILICAL HERNIA REPAIR N/A 06/13/2018   Procedure: LAPAROSCOPIC POSSIBLE OPEN UMBILICAL HERNIA;  Surgeon: Sheldon Standing, MD;  Location: [facility] ORS;  Service: General;  Laterality: N/A;   UPPER GASTROINTESTINAL ENDOSCOPY     WISDOM TOOTH EXTRACTION      Family History  Problem Relation Age of Onset   Hypertension Father    Prostate cancer Father    Colon polyps Father    Bladder Cancer Mother    Colon cancer Maternal Grandmother    Esophageal cancer Neg Hx    Rectal cancer Neg Hx    Stomach cancer Neg Hx    Pancreatic cancer Neg Hx    Social History   Socioeconomic History   Marital status: Married    Spouse name:  Not on file   Number of children: 3   Years of education: Not on file   Highest education level: Not on file  Occupational History   Occupation: Surveyor, Minerals at [employer]  Tobacco Use   Smoking status: Never   Smokeless tobacco: Current    Types: Chew   Tobacco comments:    ocasionally  Vaping Use   Vaping status: Never Used  Substance and Sexual Activity   Alcohol use: Yes    Alcohol/week: 2.0 standard drinks of alcohol    Types: 2 Standard drinks or equivalent per week    Comment: ocassionally   Drug use: No   Sexual activity: Yes    Partners: Female  Other Topics Concern   Not on file  Social History Narrative   Not on file   Social Drivers of Health   Tobacco Use: High Risk (06/03/2024)   Patient History    Smoking Tobacco Use: Never    Smokeless Tobacco Use: Current    Passive Exposure: Not on file  Financial Resource Strain: Low Risk (03/27/2023)   Overall Financial Resource Strain (CARDIA)    Difficulty of Paying Living Expenses: Not hard at all  Food Insecurity: No Food Insecurity (03/27/2023)   Hunger Vital Sign    Worried About Running Out of Food in the Last Year: Never true    Ran Out  of Food in the Last Year: Never true  Transportation Needs: No Transportation Needs (03/27/2023)   PRAPARE - Administrator, Civil Service (Medical): No    Lack of Transportation (Non-Medical): No  Physical Activity: Inactive (03/27/2023)   Exercise Vital Sign    Days of Exercise per Week: 0 days    Minutes of Exercise per Session: 0 min  Stress: No Stress Concern Present (03/27/2023)   [employer] of Occupational Health - Occupational Stress Questionnaire    Feeling of Stress : Not at all  Social Connections: Moderately Integrated (03/27/2023)   Social Connection and Isolation Panel    Frequency of Communication with Friends and Family: More than three times a week    Frequency of Social Gatherings with Friends and Family: More than three times a week    Attends Religious Services: More than 4 times per year    Active Member of Clubs or Organizations: No    Attends Banker Meetings: Never    Marital Status: Married  Depression (PHQ2-9): Low Risk (05/30/2024)   Depression (PHQ2-9)    PHQ-2 Score: 0  Alcohol Screen: Low Risk (03/27/2023)   Alcohol Screen    Last Alcohol Screening Score (AUDIT): 0  Housing: Low Risk (03/27/2023)   Housing    Last Housing Risk Score: 0  Utilities: Not At Risk (03/27/2023)   AHC Utilities    Threatened with loss of utilities: No  Health Literacy: Adequate Health Literacy (03/27/2023)   B1300 Health Literacy    Frequency of need for help with medical instructions: Never    Objective:  There were no vitals taken for this visit.     06/03/2024    2:00 PM 05/30/2024    9:31 AM 10/02/2023    8:02 AM  BP/Weight  Systolic BP 138 118 124  Diastolic BP 88 82 78  Wt. (Lbs) 190.2 189 193  BMI 30.7 kg/m2 30.51 kg/m2 31.15 kg/m2    Physical Exam Vitals reviewed.  Constitutional:      Appearance: Normal appearance.  Cardiovascular:     Rate and Rhythm: Normal rate and regular  rhythm.     Heart sounds:  Normal heart sounds.  Pulmonary:     Effort: Pulmonary effort is normal.     Breath sounds: Normal breath sounds.  Abdominal:     General: Bowel sounds are normal.     Palpations: Abdomen is soft.     Tenderness: There is no abdominal tenderness.  Neurological:     Mental Status: He is alert and oriented to person, place, and time.  Psychiatric:        Mood and Affect: Mood normal.        Behavior: Behavior normal.     {Perform Simple Foot Exam  Perform Detailed exam:1} {Insert foot Exam (Optional):30965}   Lab Results  Component Value Date   WBC 7.6 10/02/2023   HGB 14.7 10/02/2023   HCT 43.8 10/02/2023   PLT 278 10/02/2023   GLUCOSE 123 (H) 10/02/2023   CHOL 132 10/02/2023   TRIG 106 10/02/2023   HDL 53 10/02/2023   LDLCALC 60 10/02/2023   ALT 35 10/02/2023   AST 22 10/02/2023   NA 141 10/02/2023   K 5.0 10/02/2023   CL 103 10/02/2023   CREATININE 1.18 10/02/2023   BUN 18 10/02/2023   CO2 22 10/02/2023   TSH 1.350 11/14/2016   INR 1.13 06/21/2018   HGBA1C 5.5 05/30/2024    Results for orders placed or performed in visit on 06/18/24  MYOCARDIAL PERFUSION IMAGING   Collection Time: 06/18/24  2:48 PM  Result Value Ref Range   Rest Nuclear Isotope Dose 10.4 mCi   Stress Nuclear Isotope Dose 32.7 mCi   Angina Index 0    Rest HR 83.0 bpm   Rest BP 128/93 mmHg   Exercise duration (min) 9 min   Exercise duration (sec) 0 sec   Estimated workload 10.1    Peak HR 153 bpm   Peak BP 154/69 mmHg   MPHR 163 bpm   Percent HR 93.0 %   RPE 18.0    SSS 0.0    SRS 0.0    SDS 0.0    TID 1.11    LV sys vol 31.0 4.2 - 5.8 mL   LV dias vol 78.0 62 - 150 mL   Nuc Stress EF 60 %  .  Assessment & Plan:   Assessment & Plan Seasonal allergic rhinitis due to pollen  Orders:   fluticasone  (FLONASE ) 50 MCG/ACT nasal spray; Place 1 spray into both nostrils daily.  Primary hypertension  Orders:   CBC with Differential/Platelet   Comprehensive metabolic panel    T4, free   TSH  Prediabetes  Orders:   Hemoglobin A1c  Elevated LDL cholesterol level  Orders:   Lipid panel  Vitamin D  deficiency  Orders:   VITAMIN D  25 Hydroxy (Vit-D Deficiency, Fractures)    There is no height or weight on file to calculate BMI.  Assessment and Plan Assessment & Plan Syncope and collapse Recent syncopal episode with differential diagnosis including hypoglycemia, exertion, thyroid dysfunction, cardiac arrhythmia, and aortic valve regurgitation. - Ordered thyroid function tests. - Will review stress test results from cardiology. - Will consider repeat echocardiogram.  Aortic valve regurgitation Chronic condition with increased shortness of breath, potential worsening contributing to syncope. - Will consider repeat echocardiogram.  Cardiac arrhythmia Previous palpitations improved with Cardene, potential exacerbation due to thyroid dysfunction or valve regurgitation. - Will review stress test results from cardiology. - Will consider repeat echocardiogram.  Primary hypertension Blood pressure well-controlled with current medication.  Prediabetes Potential hypoglycemic  episodes contributing to syncope. - Ordered thyroid function tests.  Recording duration: 20 minutes     No orders of the defined types were placed in this encounter.   No orders of the defined types were placed in this encounter.      Follow-up: No follow-ups on file.  An After Visit Summary was printed and given to the patient.  Nola Angles, [place] [facility] [phone]       [1] Current Outpatient Medications on File Prior to Visit  Medication Sig Dispense Refill   albuterol  (VENTOLIN  HFA) 108 (90 Base) MCG/ACT inhaler Inhale 2 puffs into the lungs every 6 (six) hours as needed for wheezing or shortness of breath. 18 g 3   amoxicillin -clavulanate (AUGMENTIN ) 875-125 MG tablet Take 1 tablet by mouth 2 (two) times daily. 14 tablet 0    atorvastatin  (LIPITOR) 10 MG tablet Take 1 tablet (10 mg total) by mouth daily. 90 tablet 1   carvedilol  (COREG ) 12.5 MG tablet TAKE 1 TABLET(12.5 MG) BY MOUTH TWICE DAILY 180 tablet 0   Cholecalciferol  (VITAMIN D3) 50 MCG (2000 UT) TABS Take 1 tablet (50 mcg total) by mouth daily. 90 tablet 3   cyclobenzaprine  (FLEXERIL ) 5 MG tablet Take 1 tablet (5 mg total) by mouth 3 (three) times daily as needed for muscle spasms. 90 tablet 1   fexofenadine  (ALLEGRA ) 180 MG tablet Take 1 tablet (180 mg total) by mouth daily. 90 tablet 3   fluticasone  (FLONASE ) 50 MCG/ACT nasal spray Place 1 spray into both nostrils daily. 11.1 mL 6   gabapentin  (NEURONTIN ) 300 MG capsule TAKE 1 CAPSULE BY MOUTH IN THE MORNING AND 2 CAPSULES IN THE EVENING 270 capsule 1   metFORMIN  (GLUCOPHAGE ) 500 MG tablet TAKE 1 TABLET(500 MG) BY MOUTH TWICE DAILY WITH A MEAL 180 tablet 0   metoCLOPramide  (REGLAN ) 5 MG tablet Take 1 tablet (5 mg total) by mouth once a week. TAKE WEEKLY AS NEEDED FOR ACID REFLUX 30 tablet 3   valsartan  (DIOVAN ) 80 MG tablet Take 1 tablet by mouth daily 90 tablet 0   Vonoprazan Fumarate  (VOQUEZNA ) 10 MG TABS Take 10 mg by mouth daily. 30 tablet 3   No current facility-administered medications on file prior to visit.  "

## 2024-09-01 NOTE — Assessment & Plan Note (Addendum)
 Labs drawn today Will adjust treatment depending on results Orders:   VITAMIN D  25 Hydroxy (Vit-D Deficiency, Fractures)

## 2024-09-01 NOTE — Assessment & Plan Note (Addendum)
 Primary hypertension Blood pressure well-controlled with current medication. Continue taking Coreg  12.5mg . Valsartan  80mg  Orders:   CBC with Differential/Platelet   Comprehensive metabolic panel   T4, free   TSH

## 2024-09-02 LAB — COMPREHENSIVE METABOLIC PANEL WITH GFR
ALT: 116 IU/L — ABNORMAL HIGH (ref 0–44)
AST: 47 IU/L — ABNORMAL HIGH (ref 0–40)
Albumin: 4.6 g/dL (ref 3.8–4.9)
Alkaline Phosphatase: 97 IU/L (ref 47–123)
BUN/Creatinine Ratio: 17 (ref 9–20)
BUN: 19 mg/dL (ref 6–24)
Bilirubin Total: 0.9 mg/dL (ref 0.0–1.2)
CO2: 23 mmol/L (ref 20–29)
Calcium: 10.2 mg/dL (ref 8.7–10.2)
Chloride: 102 mmol/L (ref 96–106)
Creatinine, Ser: 1.15 mg/dL (ref 0.76–1.27)
Globulin, Total: 1.9 g/dL (ref 1.5–4.5)
Glucose: 116 mg/dL — ABNORMAL HIGH (ref 70–99)
Potassium: 5.4 mmol/L — ABNORMAL HIGH (ref 3.5–5.2)
Sodium: 141 mmol/L (ref 134–144)
Total Protein: 6.5 g/dL (ref 6.0–8.5)
eGFR: 74 mL/min/1.73

## 2024-09-02 LAB — CBC WITH DIFFERENTIAL/PLATELET
Basophils Absolute: 0.1 x10E3/uL (ref 0.0–0.2)
Basos: 1 %
EOS (ABSOLUTE): 0.2 x10E3/uL (ref 0.0–0.4)
Eos: 2 %
Hematocrit: 42.5 % (ref 37.5–51.0)
Hemoglobin: 14.1 g/dL (ref 13.0–17.7)
Immature Grans (Abs): 0 x10E3/uL (ref 0.0–0.1)
Immature Granulocytes: 0 %
Lymphocytes Absolute: 0.9 x10E3/uL (ref 0.7–3.1)
Lymphs: 12 %
MCH: 31.1 pg (ref 26.6–33.0)
MCHC: 33.2 g/dL (ref 31.5–35.7)
MCV: 94 fL (ref 79–97)
Monocytes Absolute: 0.6 x10E3/uL (ref 0.1–0.9)
Monocytes: 8 %
Neutrophils Absolute: 5.6 x10E3/uL (ref 1.4–7.0)
Neutrophils: 77 %
Platelets: 314 x10E3/uL (ref 150–450)
RBC: 4.53 x10E6/uL (ref 4.14–5.80)
RDW: 12.3 % (ref 11.6–15.4)
WBC: 7.3 x10E3/uL (ref 3.4–10.8)

## 2024-09-02 LAB — LIPID PANEL
Chol/HDL Ratio: 1.9 ratio (ref 0.0–5.0)
Cholesterol, Total: 137 mg/dL (ref 100–199)
HDL: 73 mg/dL
LDL Chol Calc (NIH): 50 mg/dL (ref 0–99)
Triglycerides: 67 mg/dL (ref 0–149)
VLDL Cholesterol Cal: 14 mg/dL (ref 5–40)

## 2024-09-02 LAB — VITAMIN D 25 HYDROXY (VIT D DEFICIENCY, FRACTURES): Vit D, 25-Hydroxy: 31 ng/mL (ref 30.0–100.0)

## 2024-09-02 LAB — TSH: TSH: 1.64 u[IU]/mL (ref 0.450–4.500)

## 2024-09-02 LAB — T4, FREE: Free T4: 1.12 ng/dL (ref 0.82–1.77)

## 2024-09-02 LAB — HEMOGLOBIN A1C
Est. average glucose Bld gHb Est-mCnc: 126 mg/dL
Hgb A1c MFr Bld: 6 % — ABNORMAL HIGH (ref 4.8–5.6)

## 2024-09-03 ENCOUNTER — Ambulatory Visit: Payer: Self-pay | Admitting: Physician Assistant

## 2024-09-03 DIAGNOSIS — R748 Abnormal levels of other serum enzymes: Secondary | ICD-10-CM

## 2024-09-04 ENCOUNTER — Encounter: Payer: Self-pay | Admitting: Physician Assistant

## 2024-09-04 ENCOUNTER — Ambulatory Visit: Admitting: Physician Assistant

## 2024-09-04 ENCOUNTER — Ambulatory Visit: Payer: Self-pay

## 2024-09-04 VITALS — BP 118/84 | HR 66 | Temp 97.8°F | Ht 66.0 in | Wt 194.3 lb

## 2024-09-04 DIAGNOSIS — S0990XD Unspecified injury of head, subsequent encounter: Secondary | ICD-10-CM

## 2024-09-04 DIAGNOSIS — R55 Syncope and collapse: Secondary | ICD-10-CM | POA: Insufficient documentation

## 2024-09-04 DIAGNOSIS — S060XAD Concussion with loss of consciousness status unknown, subsequent encounter: Secondary | ICD-10-CM | POA: Diagnosis not present

## 2024-09-04 DIAGNOSIS — F5101 Primary insomnia: Secondary | ICD-10-CM | POA: Diagnosis not present

## 2024-09-04 DIAGNOSIS — S060XAA Concussion with loss of consciousness status unknown, initial encounter: Secondary | ICD-10-CM | POA: Insufficient documentation

## 2024-09-04 NOTE — Assessment & Plan Note (Signed)
 Concussion Symptoms include head fog, difficulty with word retrieval, and vision changes. Imaging will differentiate between concussion and intracranial hemorrhage. If imaging is normal, focus on rest and symptom monitoring. - If imaging is normal, manage as concussion with rest and avoidance of screens. - Advised frequent breaks and rest in a dark room. - Recommended Benadryl  for sleep if needed.

## 2024-09-04 NOTE — Progress Notes (Signed)
 "  Acute Office Visit  Subjective:    Patient ID: Gary Weaver, male    DOB: 10/15/1965, 59 y.o.   MRN: [id number]  Chief Complaint  Patient presents with   Vision changes since fall    HPI: Patient is in today for new onset vision changes.  Discussed the use of AI scribe software for clinical note transcription with the patient, who gave verbal consent to proceed.  History of Present Illness Gary Weaver is a 58 year old male who presents with vision changes after a fall.  He experiences vision changes in his right eye, which began yesterday afternoon while at his computer. Initially, he noticed a spot that was out of focus, resolving when he closed his eye. The blurriness lasted about an hour and subsided when he stopped looking at the computer. Today, the blurriness has become more constant and is described as slightly out of focus. Removing his glasses seems to worsen the blurriness. His vision is blurry, especially when looking at screens.  He denies any abnormal taste or smell, weakness on one side of the body, or headaches since the incident. However, he feels 'foggy headed' and not quite himself, describing a sensation of light pressure in his head. He notes difficulty with decision-making and word-finding, which was more pronounced over the weekend. He describes feeling 'out of it' and that his thinking and memory were distant. By Monday, he felt more like himself, but his head still felt cloudy.  He recounts a fall last weekend where he hit his head on a chair and then the concrete floor. He was told by a nurse that his head bounced off the floor. He experienced dizziness immediately after the fall but did not seek immediate medical attention. He has a history of concussions from playing football and racing cars, but notes that this incident feels different as he is not experiencing the typical symptoms of nausea or severe headache. He reports tenderness on the side of  his head where he hit the chair and concrete, which has developed over the last two to three days. He does not recall any ringing in his ears after the fall. He was told by his son that he appeared to lean over as if looking at something before falling, but he does not remember this.  He has been functioning at work, running three plants, but feels that everything is a little distant and he does not feel like himself. He has a history of arthritis, particularly in his ankle, which causes pain. He describes a sleep pattern where he gets about four to six hours of sleep per night, often struggling to turn his brain off. He has tried melatonin in the past but avoids it now due to concerns about worsening symptoms. He has not had a sleep study done.       Past Medical History:  Diagnosis Date   Anxiety    Arrhythmia    Arthritis    Asthma    Bile leak    Celiac disease    GERD (gastroesophageal reflux disease)    Gluten intolerance    Headache    Hearing loss    Hiatal hernia    Hx of acute pancreatitis 05/09/2017   Hyperlipidemia    Hypertension    Intra-abdominal fluid collection 06/20/2018   Memory loss    Pancreatitis    Pneumonia    hx of    Vision abnormalities     Past Surgical History:  Procedure Laterality Date   BILIARY STENT PLACEMENT  06/23/2018   Procedure: BILIARY STENT PLACEMENT;  Surgeon: Saintclair Jasper, MD;  Location: [facility] ENDOSCOPY;  Service: Gastroenterology;;   COLONOSCOPY     ERCP N/A 06/23/2018   Procedure: ENDOSCOPIC RETROGRADE CHOLANGIOPANCREATOGRAPHY (ERCP);  Surgeon: Saintclair Jasper, MD;  Location: [facility] ENDOSCOPY;  Service: Gastroenterology;  Laterality: N/A;   ESOPHAGOGASTRODUODENOSCOPY (EGD) WITH PROPOFOL  N/A 07/23/2018   Procedure: ESOPHAGOGASTRODUODENOSCOPY (EGD) WITH PROPOFOL ;  Surgeon: Legrand Victory LITTIE DOUGLAS, MD;  Location: [facility] ENDOSCOPY;  Service: Gastroenterology;  Laterality: N/A;   LAPAROSCOPIC CHOLECYSTECTOMY SINGLE SITE WITH INTRAOPERATIVE CHOLANGIOGRAM N/A  06/13/2018   Procedure: LAPAROSCOPIC CHOLECYSTECTOMY SINGLE SITE WITH INTRAOPERATIVE CHOLANGIOGRAM ERAS PATHWAY;  Surgeon: Sheldon Standing, MD;  Location: [facility] ORS;  Service: General;  Laterality: N/A;   SPHINCTEROTOMY  06/23/2018   Procedure: SPHINCTEROTOMY;  Surgeon: Saintclair Jasper, MD;  Location[facility] ENDOSCOPY;  Service: Gastroenterology;;   STENT REMOVAL  07/23/2018   Procedure: STENT REMOVAL;  Surgeon: Legrand Victory LITTIE DOUGLAS, MD;  Location: [facility] ENDOSCOPY;  Service: Gastroenterology;;   UMBILICAL HERNIA REPAIR N/A 06/13/2018   Procedure: LAPAROSCOPIC POSSIBLE OPEN UMBILICAL HERNIA;  Surgeon: Sheldon Standing, MD;  Location: [facility] ORS;  Service: General;  Laterality: N/A;   UPPER GASTROINTESTINAL ENDOSCOPY     WISDOM TOOTH EXTRACTION      Family History  Problem Relation Age of Onset   Hypertension Father    Prostate cancer Father    Colon polyps Father    Bladder Cancer Mother    Colon cancer Maternal Grandmother    Esophageal cancer Neg Hx    Rectal cancer Neg Hx    Stomach cancer Neg Hx    Pancreatic cancer Neg Hx     Social History   Socioeconomic History   Marital status: Married    Spouse name: Not on file   Number of children: 3   Years of education: Not on file   Highest education level: Not on file  Occupational History   Occupation: Surveyor, Minerals at [employer]  Tobacco Use   Smoking status: Never   Smokeless tobacco: Former    Types: Chew    Quit date: 03/14/2024   Tobacco comments:    ocasionally  Vaping Use   Vaping status: Never Used  Substance and Sexual Activity   Alcohol use: Yes    Alcohol/week: 2.0 standard drinks of alcohol    Types: 2 Standard drinks or equivalent per week    Comment: ocassionally   Drug use: No   Sexual activity: Yes    Partners: Female  Other Topics Concern   Not on file  Social History Narrative   Not on file   Social Drivers of Health   Tobacco Use: Medium Risk (09/04/2024)   Patient History    Smoking Tobacco Use: Never     Smokeless Tobacco Use: Former    Passive Exposure: Not on Actuary Strain: Low Risk (03/27/2023)   Overall Financial Resource Strain (CARDIA)    Difficulty of Paying Living Expenses: Not hard at all  Food Insecurity: No Food Insecurity (03/27/2023)   Hunger Vital Sign    Worried About Running Out of Food in the Last Year: Never true    Ran Out of Food in the Last Year: Never true  Transportation Needs: No Transportation Needs (03/27/2023)   PRAPARE - Administrator, Civil Service (Medical): No    Lack of Transportation (Non-Medical): No  Physical Activity: Inactive (03/27/2023)   Exercise Vital Sign  Days of Exercise per Week: 0 days    Minutes of Exercise per Session: 0 min  Stress: No Stress Concern Present (03/27/2023)   [employer] of Occupational Health - Occupational Stress Questionnaire    Feeling of Stress : Not at all  Social Connections: Moderately Integrated (03/27/2023)   Social Connection and Isolation Panel    Frequency of Communication with Friends and Family: More than three times a week    Frequency of Social Gatherings with Friends and Family: More than three times a week    Attends Religious Services: More than 4 times per year    Active Member of [employer] or Organizations: No    Attends Banker Meetings: Never    Marital Status: Married  Catering Manager Violence: Not At Risk (03/27/2023)   Humiliation, Afraid, Rape, and Kick questionnaire    Fear of Current or Ex-Partner: No    Emotionally Abused: No    Physically Abused: No    Sexually Abused: No  Depression (PHQ2-9): Low Risk (05/30/2024)   Depression (PHQ2-9)    PHQ-2 Score: 0  Alcohol Screen: Low Risk (03/27/2023)   Alcohol Screen    Last Alcohol Screening Score (AUDIT): 0  Housing: Low Risk (03/27/2023)   Housing    Last Housing Risk Score: 0  Utilities: Not At Risk (03/27/2023)   AHC Utilities    Threatened with loss of utilities: No  Health Literacy: Adequate  Health Literacy (03/27/2023)   B1300 Health Literacy    Frequency of need for help with medical instructions: Never    Outpatient Medications Prior to Visit  Medication Sig Dispense Refill   albuterol  (VENTOLIN  HFA) 108 (90 Base) MCG/ACT inhaler Inhale 2 puffs into the lungs every 6 (six) hours as needed for wheezing or shortness of breath. 18 g 3   atorvastatin  (LIPITOR) 10 MG tablet Take 1 tablet (10 mg total) by mouth daily. 90 tablet 1   carvedilol  (COREG ) 12.5 MG tablet TAKE 1 TABLET(12.5 MG) BY MOUTH TWICE DAILY 180 tablet 0   Cholecalciferol  (VITAMIN D3) 50 MCG (2000 UT) TABS Take 1 tablet (50 mcg total) by mouth daily. 90 tablet 3   cyclobenzaprine  (FLEXERIL ) 5 MG tablet Take 1 tablet (5 mg total) by mouth 3 (three) times daily as needed for muscle spasms. 90 tablet 1   fexofenadine  (ALLEGRA ) 180 MG tablet Take 1 tablet (180 mg total) by mouth daily. 90 tablet 3   fluticasone  (FLONASE ) 50 MCG/ACT nasal spray Place 1 spray into both nostrils daily. 11.1 mL 6   gabapentin  (NEURONTIN ) 300 MG capsule TAKE 1 CAPSULE BY MOUTH IN THE MORNING AND 2 CAPSULES IN THE EVENING 270 capsule 1   metFORMIN  (GLUCOPHAGE ) 500 MG tablet TAKE 1 TABLET(500 MG) BY MOUTH TWICE DAILY WITH A MEAL 180 tablet 0   metoCLOPramide  (REGLAN ) 5 MG tablet Take 1 tablet (5 mg total) by mouth once a week. TAKE WEEKLY AS NEEDED FOR ACID REFLUX 30 tablet 3   omeprazole  (PRILOSEC) 40 MG capsule SMARTSIG:1 Capsule(s) By Mouth 1-2 Times Daily     valsartan  (DIOVAN ) 80 MG tablet Take 1 tablet by mouth daily 90 tablet 0   No facility-administered medications prior to visit.    Allergies[1]  Review of Systems  Constitutional:  Negative for appetite change, fatigue and fever.  HENT:  Negative for congestion, ear pain, sinus pressure and sore throat.   Eyes:  Positive for visual disturbance (Blurry spot right eye).  Respiratory:  Negative for cough, chest tightness, shortness of breath  and wheezing.   Cardiovascular:  Negative  for chest pain and palpitations.  Gastrointestinal:  Negative for abdominal pain, constipation, diarrhea, nausea and vomiting.  Genitourinary:  Negative for dysuria and hematuria.  Musculoskeletal:  Negative for arthralgias, back pain, joint swelling and myalgias.  Skin:  Negative for rash.  Neurological:  Negative for dizziness, weakness and headaches.       Foggy brain feeling  Psychiatric/Behavioral:  Negative for dysphoric mood. The patient is not nervous/anxious.        Objective:        09/04/2024    1:18 PM 09/01/2024    8:51 AM 06/03/2024    2:00 PM  Vitals with BMI  Height 5' 6 5' 6 5' 6  Weight 194 lbs 5 oz 198 lbs 190 lbs 3 oz  BMI 31.38 31.97 30.71  Systolic 118 112 861  Diastolic 84 80 88  Pulse 66 72 102    Orthostatic VS for the past 72 hrs (Last 3 readings):  Patient Position BP Location  09/04/24 1318 Sitting Right Arm     Physical Exam Vitals reviewed.  Constitutional:      Appearance: Normal appearance.  Eyes:     General: Visual field deficit present.        Right eye: No foreign body.        Left eye: No foreign body.     Extraocular Movements:     Right eye: Normal extraocular motion and no nystagmus.     Left eye: Normal extraocular motion and no nystagmus.  Neck:     Vascular: No carotid bruit.  Cardiovascular:     Rate and Rhythm: Normal rate and regular rhythm.     Heart sounds: Normal heart sounds.  Pulmonary:     Effort: Pulmonary effort is normal.     Breath sounds: Normal breath sounds.  Abdominal:     General: Bowel sounds are normal.     Palpations: Abdomen is soft.     Tenderness: There is no abdominal tenderness.  Neurological:     Mental Status: He is alert and oriented to person, place, and time.     Cranial Nerves: Cranial nerve deficit present. No facial asymmetry.     Motor: No weakness.     Coordination: Coordination is intact.     Gait: Gait is intact.     Comments: Peripheral vision loss in the superior lateral  aspect of the right eye when compared to left  Central vision distortion with more difficulty with near sited then far sited.   Psychiatric:        Mood and Affect: Mood normal.        Behavior: Behavior normal.     Health Maintenance Due  Topic Date Due   Pneumococcal Vaccine: 50+ Years (1 of 2 - PCV) Never done   Hepatitis B Vaccines 19-59 Average Risk (1 of 3 - 19+ 3-dose series) Never done   COVID-19 Vaccine (3 - 2025-26 season) 04/14/2024       Topic Date Due   Hepatitis B Vaccines 19-59 Average Risk (1 of 3 - 19+ 3-dose series) Never done     Lab Results  Component Value Date   TSH 1.640 09/01/2024   Lab Results  Component Value Date   WBC 7.3 09/01/2024   HGB 14.1 09/01/2024   HCT 42.5 09/01/2024   MCV 94 09/01/2024   PLT 314 09/01/2024   Lab Results  Component Value Date   NA 141 09/01/2024  K 5.4 (H) 09/01/2024   CO2 23 09/01/2024   GLUCOSE 116 (H) 09/01/2024   BUN 19 09/01/2024   CREATININE 1.15 09/01/2024   BILITOT 0.9 09/01/2024   ALKPHOS 97 09/01/2024   AST 47 (H) 09/01/2024   ALT 116 (H) 09/01/2024   PROT 6.5 09/01/2024   ALBUMIN 4.6 09/01/2024   CALCIUM  10.2 09/01/2024   ANIONGAP 8 06/24/2018   EGFR 74 09/01/2024   GFR 72.02 05/07/2017   Lab Results  Component Value Date   CHOL 137 09/01/2024   Lab Results  Component Value Date   HDL 73 09/01/2024   Lab Results  Component Value Date   LDLCALC 50 09/01/2024   Lab Results  Component Value Date   TRIG 67 09/01/2024   Lab Results  Component Value Date   CHOLHDL 1.9 09/01/2024   Lab Results  Component Value Date   HGBA1C 6.0 (H) 09/01/2024        Results for orders placed or performed in visit on 09/01/24  CBC with Differential/Platelet   Collection Time: 09/01/24  9:56 AM  Result Value Ref Range   WBC 7.3 3.4 - 10.8 x10E3/uL   RBC 4.53 4.14 - 5.80 x10E6/uL   Hemoglobin 14.1 13.0 - 17.7 g/dL   Hematocrit 57.4 62.4 - 51.0 %   MCV 94 79 - 97 fL   MCH 31.1 26.6 - 33.0 pg    MCHC 33.2 31.5 - 35.7 g/dL   RDW 87.6 88.3 - 84.5 %   Platelets 314 150 - 450 x10E3/uL   Neutrophils 77 Not Estab. %   Lymphs 12 Not Estab. %   Monocytes 8 Not Estab. %   Eos 2 Not Estab. %   Basos 1 Not Estab. %   Neutrophils Absolute 5.6 1.4 - 7.0 x10E3/uL   Lymphocytes Absolute 0.9 0.7 - 3.1 x10E3/uL   Monocytes Absolute 0.6 0.1 - 0.9 x10E3/uL   EOS (ABSOLUTE) 0.2 0.0 - 0.4 x10E3/uL   Basophils Absolute 0.1 0.0 - 0.2 x10E3/uL   Immature Granulocytes 0 Not Estab. %   Immature Grans (Abs) 0.0 0.0 - 0.1 x10E3/uL  Comprehensive metabolic panel   Collection Time: 09/01/24  9:56 AM  Result Value Ref Range   Glucose 116 (H) 70 - 99 mg/dL   BUN 19 6 - 24 mg/dL   Creatinine, Ser 8.84 0.76 - 1.27 mg/dL   eGFR 74 >40 fO/fpw/8.26   BUN/Creatinine Ratio 17 9 - 20   Sodium 141 134 - 144 mmol/L   Potassium 5.4 (H) 3.5 - 5.2 mmol/L   Chloride 102 96 - 106 mmol/L   CO2 23 20 - 29 mmol/L   Calcium  10.2 8.7 - 10.2 mg/dL   Total Protein 6.5 6.0 - 8.5 g/dL   Albumin 4.6 3.8 - 4.9 g/dL   Globulin, Total 1.9 1.5 - 4.5 g/dL   Bilirubin Total 0.9 0.0 - 1.2 mg/dL   Alkaline Phosphatase 97 47 - 123 IU/L   AST 47 (H) 0 - 40 IU/L   ALT 116 (H) 0 - 44 IU/L  Hemoglobin A1c   Collection Time: 09/01/24  9:56 AM  Result Value Ref Range   Hgb A1c MFr Bld 6.0 (H) 4.8 - 5.6 %   Est. average glucose Bld gHb Est-mCnc 126 mg/dL  Lipid panel   Collection Time: 09/01/24  9:56 AM  Result Value Ref Range   Cholesterol, Total 137 100 - 199 mg/dL   Triglycerides 67 0 - 149 mg/dL   HDL 73 >60 mg/dL  VLDL Cholesterol Cal 14 5 - 40 mg/dL   LDL Chol Calc (NIH) 50 0 - 99 mg/dL   Chol/HDL Ratio 1.9 0.0 - 5.0 ratio  VITAMIN D  25 Hydroxy (Vit-D Deficiency, Fractures)   Collection Time: 09/01/24  9:56 AM  Result Value Ref Range   Vit D, 25-Hydroxy 31.0 30.0 - 100.0 ng/mL  T4, free   Collection Time: 09/01/24  9:56 AM  Result Value Ref Range   Free T4 1.12 0.82 - 1.77 ng/dL  TSH   Collection Time: 09/01/24   9:56 AM  Result Value Ref Range   TSH 1.640 0.450 - 4.500 uIU/mL     Assessment & Plan:   Assessment & Plan Traumatic injury of head, subsequent encounter 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 Differential includes concussion versus intracranial hemorrhage. Unilateral vision changes and peripheral vision loss suggest possible intracranial hemorrhage. Imaging required to rule out hemorrhage. - Ordered brain imaging to rule out intracranial hemorrhage. - Scheduled imaging at [facility] or [facility], depending on availability. Orders:   CT HEAD WO CONTRAST ( ); Future  Concussion with unknown loss of consciousness status, subsequent encounter Concussion Symptoms include head fog, difficulty with word retrieval, and vision changes. Imaging will differentiate between concussion and intracranial hemorrhage. If imaging is normal, focus on rest and symptom monitoring. - If imaging is normal, manage as concussion with rest and avoidance of screens. - Advised frequent breaks and rest in a dark room. - Recommended Benadryl  for sleep if needed.    Primary insomnia Insomnia Chronic insomnia with difficulty initiating and maintaining sleep. Works night shifts, affecting sleep patterns. Melatonin not recommended due to potential impact on concussion symptoms. - Recommended over-the-counter Benadryl  for sleep. - Advised against melatonin due to potential worsening of concussion symptoms.       Body mass index is 31.36 kg/m.[name]   No orders of the defined types were placed in this encounter.   Orders Placed This Encounter  Procedures   CT HEAD WO CONTRAST ( )     Follow-up: No follow-ups on file.  An After Visit Summary was printed and given to the patient.   I,Lauren M Auman,acting as a neurosurgeon for [employer].,have documented all relevant documentation on the behalf of  Nola Angles, PA,as directed by  Nola Angles, PA while in the presence of Nola Angles, [place].    Nola Angles, [place] [facility] [phone]     [1]  Allergies Allergen Reactions   Egg Protein-Containing Drug Products Diarrhea   Gluten Meal Nausea And Vomiting   Latex Rash    Occurs after long use of latex   "

## 2024-09-04 NOTE — Assessment & Plan Note (Signed)
 Insomnia Chronic insomnia with difficulty initiating and maintaining sleep. Works night shifts, affecting sleep patterns. Melatonin not recommended due to potential impact on concussion symptoms. - Recommended over-the-counter Benadryl  for sleep. - Advised against melatonin due to potential worsening of concussion symptoms.

## 2024-09-04 NOTE — Telephone Encounter (Signed)
 FYI Only or Action Required?: FYI only for provider: appointment scheduled on 1/22.  Patient was last seen in primary care on 09/01/2024 by Gary Cleaves, PA.  Called Nurse Triage reporting Head Injury.  Symptoms began 1/17.  Interventions attempted: Rest, hydration, or home remedies.  Symptoms are: unchanged.  Triage Disposition: See PCP When Office is Open (Within 3 Days)  Patient/caregiver understands and will follow disposition?: Yes    Gary Weaver and hit head after passing out at a gun show on Saturday 1/17. Was out for 15 seconds. Sates he felt like his sugars were low at the time. Pre diabetic. Takes metformin . Saw PCP on 1/19. Labs ordered and echo considered, not yet ordered.  Symptoms improved up until Monday but now has not seen improvement. Pt reports he still feels mildly foggy headed. Denies headache. Yesterday 30 minute episode where spot in right eye was mildly blurry, improved when closing his eye and blinked. No loss of vision or double vision. Scheduled appt with PCP today. Advised UC or ED for worsening symptoms.     Message from Gattis SQUIBB sent at 09/04/2024 10:35 AM EST  Reason for Triage: Pt fell and hit his head.  He seen Pa Weaver Gary but is still havin issues and needs to talk to a nure   Reason for Disposition  [1] After 3 days AND [2] headache persists    After 3 days and foggy-headedness persists.  Answer Assessment - Initial Assessment Questions 1. MECHANISM: How did the injury happen? For falls, ask: What height did you fall from? and What surface did you fall against?      Gary Weaver and hit head after passing out at a gun show  2. ONSET: When did the injury happen? (e.g., minutes, hours ago)      Saturday  3. NEUROLOGIC SYMPTOMS: Was there any loss of consciousness? Are there any other neurological symptoms?      LOC of 15 seconds  4. MENTAL STATUS: Does the person know who they are, who you are, and where they are?      Yes  5. LOCATION:  What part of the head was hit?      Right side of head  6. SCALP APPEARANCE: What does the scalp look like? Is it bleeding now? If Yes, ask: Is it difficult to stop?      Denies, appears normal  7. SIZE: For cuts, bruises, or swelling, ask: How large is it? (e.g., inches or centimeters)      Denies  8. PAIN: Is there any pain? If Yes, ask: How bad is it? (Scale 0-10; or none, mild, moderate, severe)     No headache. Surface of skin sore to tough  9. TETANUS: For any breaks in the skin, ask: When was your last tetanus booster?     N/A  10. BLOOD THINNERS: Do you take any blood thinners? (e.g., aspirin, clopidogrel / Plavix, coumadin, heparin). Notes: Other strong blood thinners include: Arixtra (fondaparinux), Eliquis (apixaban), Pradaxa (dabigatran), and Xarelto (rivaroxaban).       Denies  11. OTHER SYMPTOMS: Do you have any other symptoms? (e.g., neck pain, vomiting)       Feels a little foggy headed. Yesterday 30 minute episode where spot in right eye was mildly blurry, improved when closing his eye and blinked.  Protocols used: Head Injury-A-AH

## 2024-09-04 NOTE — Assessment & Plan Note (Signed)
 Previous palpitations improved with Coreg  12.5mg , potential exacerbation due to thyroid dysfunction or valve regurgitation. - Will review stress test results from cardiology. - Will consider repeat echocardiogram.

## 2024-09-04 NOTE — Assessment & Plan Note (Signed)
 Syncope and collapse Recent syncopal episode with differential diagnosis including hypoglycemia, exertion, thyroid dysfunction, cardiac arrhythmia, and aortic valve regurgitation. - Ordered thyroid function tests. - Will review stress test results from cardiology. - Will consider repeat echocardiogram. Orders:   EKG 12-Lead

## 2024-09-05 ENCOUNTER — Other Ambulatory Visit: Payer: Self-pay | Admitting: Physician Assistant

## 2024-09-05 ENCOUNTER — Ambulatory Visit (HOSPITAL_BASED_OUTPATIENT_CLINIC_OR_DEPARTMENT_OTHER)
Admission: RE | Admit: 2024-09-05 | Discharge: 2024-09-05 | Disposition: A | Source: Ambulatory Visit | Attending: Physician Assistant | Admitting: Radiology

## 2024-09-05 DIAGNOSIS — S0990XD Unspecified injury of head, subsequent encounter: Secondary | ICD-10-CM

## 2024-09-05 DIAGNOSIS — R7303 Prediabetes: Secondary | ICD-10-CM

## 2024-09-09 ENCOUNTER — Ambulatory Visit: Payer: Self-pay | Admitting: Physician Assistant

## 2024-09-10 ENCOUNTER — Other Ambulatory Visit

## 2024-09-10 DIAGNOSIS — R748 Abnormal levels of other serum enzymes: Secondary | ICD-10-CM

## 2024-09-11 LAB — COMPREHENSIVE METABOLIC PANEL WITH GFR
ALT: 34 [IU]/L (ref 0–44)
AST: 25 [IU]/L (ref 0–40)
Albumin: 4.6 g/dL (ref 3.8–4.9)
Alkaline Phosphatase: 74 [IU]/L (ref 47–123)
BUN/Creatinine Ratio: 11 (ref 9–20)
BUN: 15 mg/dL (ref 6–24)
Bilirubin Total: 1.1 mg/dL (ref 0.0–1.2)
CO2: 24 mmol/L (ref 20–29)
Calcium: 10.3 mg/dL — ABNORMAL HIGH (ref 8.7–10.2)
Chloride: 102 mmol/L (ref 96–106)
Creatinine, Ser: 1.36 mg/dL — ABNORMAL HIGH (ref 0.76–1.27)
Globulin, Total: 2.1 g/dL (ref 1.5–4.5)
Glucose: 128 mg/dL — ABNORMAL HIGH (ref 70–99)
Potassium: 5.2 mmol/L (ref 3.5–5.2)
Sodium: 142 mmol/L (ref 134–144)
Total Protein: 6.7 g/dL (ref 6.0–8.5)
eGFR: 60 mL/min/{1.73_m2}

## 2024-09-11 LAB — ACUTE HEP PANEL AND HEP B SURFACE AB
Hep A IgM: NEGATIVE
Hep B C IgM: NEGATIVE
Hep C Virus Ab: NONREACTIVE
Hepatitis B Surf Ab Quant: <3.5 m[IU]/mL — ABNORMAL LOW
Hepatitis B Surface Ag: NEGATIVE

## 2024-09-15 ENCOUNTER — Ambulatory Visit: Payer: Self-pay | Admitting: Physician Assistant

## 2024-09-17 ENCOUNTER — Other Ambulatory Visit: Payer: Self-pay

## 2024-09-17 DIAGNOSIS — I10 Essential (primary) hypertension: Secondary | ICD-10-CM

## 2024-09-17 MED ORDER — VALSARTAN 80 MG PO TABS: ORAL_TABLET | ORAL | 0 refills | Status: AC

## 2024-09-17 NOTE — Telephone Encounter (Signed)
 Copied from CRM #[id number]. Topic: Clinical - Medication Refill >> Sep 17, 2024 11:42 AM Alfonso ORN wrote: Medication:  valsartan  (DIOVAN ) 80 MG tablet  Has the patient contacted their pharmacy? Yes  This is the patient's preferred pharmacy:    [id number] [place] - [address] AT [facility] OF [address] [place] [address] Phone: [phone] Fax: [phone]  Is this the correct pharmacy for this prescription? Yes If no, delete pharmacy and type the correct one.   Has the prescription been filled recently? No  Is the patient out of the medication? Yes  Has the patient been seen for an appointment in the last year OR does the patient have an upcoming appointment? Yes  Can we respond through MyChart? Yes

## 2024-12-02 ENCOUNTER — Ambulatory Visit: Admitting: Physician Assistant
